# Patient Record
Sex: Female | Born: 1965 | Race: White | Hispanic: No | Marital: Single | Smoking: Never smoker
Health system: Southern US, Community
[De-identification: ages and names within clinical notes are randomized; demographics above are authoritative.]

## PROBLEM LIST (undated history)

## (undated) DIAGNOSIS — M1990 Unspecified osteoarthritis, unspecified site: Secondary | ICD-10-CM

## (undated) DIAGNOSIS — G8929 Other chronic pain: Secondary | ICD-10-CM

## (undated) DIAGNOSIS — F32A Depression, unspecified: Secondary | ICD-10-CM

## (undated) DIAGNOSIS — F319 Bipolar disorder, unspecified: Secondary | ICD-10-CM

## (undated) DIAGNOSIS — I10 Essential (primary) hypertension: Secondary | ICD-10-CM

## (undated) DIAGNOSIS — K219 Gastro-esophageal reflux disease without esophagitis: Secondary | ICD-10-CM

## (undated) DIAGNOSIS — F419 Anxiety disorder, unspecified: Secondary | ICD-10-CM

## (undated) HISTORY — DX: Unspecified osteoarthritis, unspecified site: M19.90

## (undated) HISTORY — DX: Gastro-esophageal reflux disease without esophagitis: K21.9

## (undated) HISTORY — PX: TONSILLECTOMY: SUR1361

## (undated) HISTORY — PX: OTHER SURGICAL HISTORY: SHX169

---

## 2008-06-10 DIAGNOSIS — Z72820 Sleep deprivation: Secondary | ICD-10-CM | POA: Insufficient documentation

## 2008-06-10 DIAGNOSIS — F4389 Other reactions to severe stress: Secondary | ICD-10-CM | POA: Insufficient documentation

## 2009-01-26 DIAGNOSIS — Z124 Encounter for screening for malignant neoplasm of cervix: Secondary | ICD-10-CM | POA: Insufficient documentation

## 2009-01-26 DIAGNOSIS — G43119 Migraine with aura, intractable, without status migrainosus: Secondary | ICD-10-CM | POA: Insufficient documentation

## 2009-01-26 DIAGNOSIS — E568 Deficiency of other vitamins: Secondary | ICD-10-CM | POA: Insufficient documentation

## 2009-02-08 DIAGNOSIS — L989 Disorder of the skin and subcutaneous tissue, unspecified: Secondary | ICD-10-CM | POA: Insufficient documentation

## 2009-02-08 DIAGNOSIS — L723 Sebaceous cyst: Secondary | ICD-10-CM | POA: Insufficient documentation

## 2009-02-18 DIAGNOSIS — Z4802 Encounter for removal of sutures: Secondary | ICD-10-CM | POA: Insufficient documentation

## 2009-10-07 DIAGNOSIS — R599 Enlarged lymph nodes, unspecified: Secondary | ICD-10-CM | POA: Insufficient documentation

## 2010-01-26 DIAGNOSIS — N959 Unspecified menopausal and perimenopausal disorder: Secondary | ICD-10-CM | POA: Insufficient documentation

## 2010-01-26 DIAGNOSIS — R82998 Other abnormal findings in urine: Secondary | ICD-10-CM | POA: Insufficient documentation

## 2010-02-09 DIAGNOSIS — H8110 Benign paroxysmal vertigo, unspecified ear: Secondary | ICD-10-CM | POA: Insufficient documentation

## 2010-02-13 DIAGNOSIS — Z1389 Encounter for screening for other disorder: Secondary | ICD-10-CM | POA: Insufficient documentation

## 2010-04-28 DIAGNOSIS — H1130 Conjunctival hemorrhage, unspecified eye: Secondary | ICD-10-CM | POA: Insufficient documentation

## 2011-03-07 DIAGNOSIS — M659 Unspecified synovitis and tenosynovitis, unspecified site: Secondary | ICD-10-CM | POA: Insufficient documentation

## 2011-03-07 DIAGNOSIS — H02829 Cysts of unspecified eye, unspecified eyelid: Secondary | ICD-10-CM | POA: Insufficient documentation

## 2011-03-14 DIAGNOSIS — G479 Sleep disorder, unspecified: Secondary | ICD-10-CM | POA: Insufficient documentation

## 2012-03-04 DIAGNOSIS — M179 Osteoarthritis of knee, unspecified: Secondary | ICD-10-CM | POA: Insufficient documentation

## 2012-03-04 DIAGNOSIS — M170 Bilateral primary osteoarthritis of knee: Secondary | ICD-10-CM | POA: Insufficient documentation

## 2012-12-30 DIAGNOSIS — G8929 Other chronic pain: Secondary | ICD-10-CM

## 2012-12-30 DIAGNOSIS — F0630 Mood disorder due to known physiological condition, unspecified: Secondary | ICD-10-CM | POA: Insufficient documentation

## 2013-09-01 DIAGNOSIS — M5136 Other intervertebral disc degeneration, lumbar region: Secondary | ICD-10-CM | POA: Insufficient documentation

## 2013-09-24 DIAGNOSIS — M25519 Pain in unspecified shoulder: Secondary | ICD-10-CM | POA: Insufficient documentation

## 2014-01-28 DIAGNOSIS — S93409A Sprain of unspecified ligament of unspecified ankle, initial encounter: Secondary | ICD-10-CM | POA: Insufficient documentation

## 2014-04-22 DIAGNOSIS — F32A Depression, unspecified: Secondary | ICD-10-CM

## 2014-06-27 DIAGNOSIS — M4310 Spondylolisthesis, site unspecified: Secondary | ICD-10-CM | POA: Insufficient documentation

## 2014-08-12 DIAGNOSIS — M25569 Pain in unspecified knee: Secondary | ICD-10-CM | POA: Insufficient documentation

## 2014-08-12 DIAGNOSIS — M25561 Pain in right knee: Secondary | ICD-10-CM | POA: Insufficient documentation

## 2014-11-09 DIAGNOSIS — Z780 Asymptomatic menopausal state: Secondary | ICD-10-CM | POA: Insufficient documentation

## 2014-11-09 DIAGNOSIS — J309 Allergic rhinitis, unspecified: Secondary | ICD-10-CM | POA: Insufficient documentation

## 2014-11-09 DIAGNOSIS — E669 Obesity, unspecified: Secondary | ICD-10-CM | POA: Insufficient documentation

## 2014-11-09 DIAGNOSIS — G4733 Obstructive sleep apnea (adult) (pediatric): Secondary | ICD-10-CM | POA: Insufficient documentation

## 2015-09-27 DIAGNOSIS — F1190 Opioid use, unspecified, uncomplicated: Secondary | ICD-10-CM | POA: Insufficient documentation

## 2016-03-08 DIAGNOSIS — S52501D Unspecified fracture of the lower end of right radius, subsequent encounter for closed fracture with routine healing: Secondary | ICD-10-CM | POA: Insufficient documentation

## 2016-06-21 DIAGNOSIS — G5601 Carpal tunnel syndrome, right upper limb: Secondary | ICD-10-CM | POA: Insufficient documentation

## 2017-01-22 DIAGNOSIS — F410 Panic disorder [episodic paroxysmal anxiety] without agoraphobia: Secondary | ICD-10-CM

## 2017-01-22 DIAGNOSIS — R5382 Chronic fatigue, unspecified: Secondary | ICD-10-CM | POA: Insufficient documentation

## 2021-04-17 ENCOUNTER — Emergency Department (HOSPITAL_COMMUNITY)
Admission: EM | Admit: 2021-04-17 | Discharge: 2021-04-18 | Disposition: A | Discharge status: Home / Self Care | Payer: BC Managed Care – PPO | Source: Home / Self Care | Attending: Emergency Medicine | Admitting: Emergency Medicine

## 2021-04-17 ENCOUNTER — Emergency Department (HOSPITAL_COMMUNITY): Payer: BC Managed Care – PPO

## 2021-04-17 DIAGNOSIS — I10 Essential (primary) hypertension: Secondary | ICD-10-CM | POA: Insufficient documentation

## 2021-04-17 DIAGNOSIS — R079 Chest pain, unspecified: Secondary | ICD-10-CM

## 2021-04-17 LAB — BASIC METABOLIC PANEL
Anion gap: 8 (ref 5–15)
BUN: 13 mg/dL (ref 6–20)
CO2: 29 mmol/L (ref 22–32)
Calcium: 9.3 mg/dL (ref 8.9–10.3)
Chloride: 101 mmol/L (ref 98–111)
Creatinine, Ser: 0.88 mg/dL (ref 0.44–1.00)
GFR, Estimated: >60 mL/min (ref >60)
Glucose, Bld: 127 mg/dL — ABNORMAL HIGH (ref 70–99)
Potassium: 4 mmol/L (ref 3.5–5.1)
Sodium: 138 mmol/L (ref 135–145)

## 2021-04-17 LAB — CBC
HCT: 40.8 % (ref 36.0–46.0)
Hemoglobin: 13.2 g/dL (ref 12.0–15.0)
MCH: 28.1 pg (ref 26.0–34.0)
MCHC: 32.4 g/dL (ref 30.0–36.0)
MCV: 87 fL (ref 80.0–100.0)
Platelets: 214 10*3/uL (ref 150–400)
RBC: 4.69 MIL/uL (ref 3.87–5.11)
RDW: 12.1 % (ref 11.5–15.5)
WBC: 9.8 10*3/uL (ref 4.0–10.5)
nRBC: 0 % (ref 0.0–0.2)

## 2021-04-17 LAB — TROPONIN I (HIGH SENSITIVITY): Troponin I (High Sensitivity): 3 ng/L (ref <18)

## 2021-04-17 NOTE — ED Triage Notes (Signed)
Pt c/o chest pressure starting last night around 9pm. Describes it as a pressure that is worse when laying down. Denies SOB

## 2021-04-17 NOTE — ED Triage Notes (Signed)
Emergency Medicine Provider Triage Evaluation Note  Holly Roberts , a 55 y.o. female  was evaluated in triage.  Pt complains of CP diffuse, onset 9PM, unable to sleep due to pain, described as pressure and sharp, constant, worse with lying down. Denies fevers, chills, cough, congestion, SHOB. Temp 100.1, has had vaccines, not due for booster.  Review of Systems  Positive: Chest pain, fever Negative: SHOB  Physical Exam  BP (!) 158/103 (BP Location: Left Arm)   Pulse (!) 101   Resp 18   Ht 5\' 4"  (1.626 m)   Wt 113.4 kg   SpO2 93%   BMI 42.91 kg/m  Gen:   Awake, no distress   HEENT:  Atraumatic  Resp:  Normal effort  Cardiac:  Normal rate  Abd:   Nondistended, nontender  MSK:   Moves extremities without difficulty  Neuro:  Speech clear   Medical Decision Making  Medically screening exam initiated at 7:50 PM.  Appropriate orders placed.  Holly Roberts was informed that the remainder of the evaluation will be completed by another provider, this initial triage assessment does not replace that evaluation, and the importance of remaining in the ED until their evaluation is complete.  Clinical Impression     Holly Roberts 04/17/21 1953

## 2021-04-18 LAB — TROPONIN I (HIGH SENSITIVITY): Troponin I (High Sensitivity): 3 ng/L (ref <18)

## 2021-04-18 MED ORDER — ACETAMINOPHEN 500 MG PO TABS
1000.0000 mg | ORAL_TABLET | Freq: Once | ORAL | Status: AC
  Administered 2021-04-18: 1000 mg via ORAL
  Filled 2021-04-18: qty 2

## 2021-04-18 MED ORDER — METOPROLOL TARTRATE 25 MG PO TABS
25.0000 mg | ORAL_TABLET | Freq: Once | ORAL | Status: AC
  Administered 2021-04-18: 25 mg via ORAL
  Filled 2021-04-18: qty 1

## 2021-04-18 MED ORDER — CLONIDINE HCL 0.1 MG PO TABS
0.1000 mg | ORAL_TABLET | Freq: Once | ORAL | Status: AC
  Administered 2021-04-18: 0.1 mg via ORAL
  Filled 2021-04-18: qty 1

## 2021-04-18 MED ORDER — METOPROLOL TARTRATE 25 MG PO TABS: 25.0000 mg | ORAL_TABLET | Freq: Every day | ORAL | 0 refills | Status: DC

## 2021-04-18 NOTE — Discharge Instructions (Addendum)
Your cardiac work-up today was normal.  There was no evidence of heart attack. Your blood pressure was significantly elevated here but has improved after some medications.   We will start you on low dose blood pressure medications for now.  You do need to arrange follow-up with your primary care doctor for ongoing management of this. Return here immediately for any new/acute changes.

## 2021-04-18 NOTE — ED Notes (Signed)
ED Provider at bedside. 

## 2021-04-18 NOTE — ED Provider Notes (Signed)
[facility]-EMERGENCY DEPT Provider Note   CSN: [id number] Arrival date & time: 04/17/21  1925     History Chief Complaint  Patient presents with  . Chest Pain    Holly Roberts is a 55 y.o. female.  The history is provided by the patient and medical records.    55 year old female presenting to the ED with chest pain.  This began Sunday night (2 days ago) around 9PM while lying in bed.  States it feels like a dull pressure throughout her entire chest.  She denies any sharp, stabbing sensations.  She has not had any palpitations, diaphoresis, nausea, vomiting, or feelings of syncope.  She states her chest is tender to the touch but she has not done any strenuous activity or anything to strain her chest muscles.  She does not have any ripping/tearing pain, no radiatio of pain into back or abdomen.  no numbness/weakness of extremities.  She has not had any cough, fever, or other infectious symptoms.  She denies any known cardiac history.  She has never been told she had hypertension, however has not had an in person clinic visit with her primary care doctor in about 2 years (only tele visits).  She is not a smoker.  No past medical history on file.  There are no problems to display for this patient.   OB History   No obstetric history on file.     No family history on file.     Home Medications Prior to Admission medications   Medication Sig Start Date End Date Taking? Authorizing Provider  Armodafinil 250 MG tablet Take 250 mg by mouth every morning. 04/09/21  Yes [provider]  diclofenac (VOLTAREN) 50 MG EC tablet Take 50 mg by mouth 2 (two) times daily. 02/08/21  Yes [provider]  NUCYNTA 100 MG TABS Take 200 mg by mouth daily in the afternoon. 03/27/21  Yes [provider]  NUCYNTA ER 200 MG TB12 Take 1 tablet by mouth 2 (two) times daily. 03/27/21  Yes [provider]  TRINTELLIX 10 MG TABS tablet Take 10 mg by mouth  daily. 04/09/21  Yes [provider]  VRAYLAR 4.5 MG CAPS Take 1 capsule by mouth daily. 04/06/21  Yes [provider]    Allergies    Buprenorphine, Amoxicillin-pot clavulanate, and Sulfa antibiotics  Review of Systems   Review of Systems  Cardiovascular: Positive for chest pain.  All other systems reviewed and are negative.   Physical Exam Updated Vital Signs BP (!) 207/133 Comment: PA aware  Pulse (!) 104   Temp 99.4 F (37.4 C) (Oral)   Resp 20   Ht 5\' 4"  (1.626 m)   Wt 113.4 kg   SpO2 95%   BMI 42.91 kg/m   Physical Exam Vitals and nursing note reviewed.  Constitutional:      Appearance: She is well-developed.     Comments: Obese, NAD  HENT:     Head: Normocephalic and atraumatic.  Eyes:     Conjunctiva/sclera: Conjunctivae normal.     Pupils: Pupils are equal, round, and reactive to light.  Cardiovascular:     Rate and Rhythm: Normal rate and regular rhythm.     Heart sounds: Normal heart sounds.  Pulmonary:     Effort: Pulmonary effort is normal.     Breath sounds: Normal breath sounds. No wheezing or rhonchi.  Chest:     Comments: Reproducible tenderness throughout chest wall, no rash or other overlying skin changes  Abdominal:     General: Bowel sounds are normal.     Palpations: Abdomen is soft.  Musculoskeletal:        General: Normal range of motion.     Cervical back: Normal range of motion.  Skin:    General: Skin is warm and dry.  Neurological:     Mental Status: She is alert and oriented to person, place, and time.     Comments: AAOx3, answering questions and following commands appropriately; equal strength UE and LE bilaterally; CN grossly intact; moves all extremities appropriately without ataxia; no focal neuro deficits or facial asymmetry appreciated     ED Results / Procedures / Treatments   Labs (all labs ordered are listed, but only abnormal results are displayed) Labs Reviewed  BASIC METABOLIC PANEL - Abnormal;  Notable for the following components:      Result Value   Glucose, Bld 127 (*)    All other components within normal limits  CBC  TROPONIN I (HIGH SENSITIVITY)  TROPONIN I (HIGH SENSITIVITY)    EKG None  Radiology DG Chest Port 1 View  Result Date: 04/17/2021 CLINICAL DATA:  Chest pain EXAM: PORTABLE CHEST 1 VIEW COMPARISON:  None. FINDINGS: The heart size and mediastinal contours are within normal limits. Both lungs are clear. The visualized skeletal structures are unremarkable. IMPRESSION: No active disease. Electronically Signed   By: Jasmine Pang M.D.   On: 04/17/2021 20:15    Procedures Procedures   Medications Ordered in ED Medications  metoprolol tartrate (LOPRESSOR) tablet 25 mg (has no administration in time range)  acetaminophen (TYLENOL) tablet 1,000 mg (has no administration in time range)    ED Course  I have reviewed the triage vital signs and the nursing notes.  Pertinent labs & imaging results that were available during my care of the patient were reviewed by me and considered in my medical decision making (see chart for details).    MDM Rules/Calculators/A&P  55 y.o. F here with dull, diffuse chest pain for the past 2 days.  Began while lying in bed, has been persistent since onset.  She is afebrile, non-toxic.  At time of evaluation, patient's BP is 207/133, mildly tachy as well.  She has no known history of hypertension, however has not had a in person visit with her physician in approximately 2 years (tele visits only).  Unknown exactly how long her blood pressure has been running this high.  She does complain of chest pain, however this is reproducible with palpation of the chest wall on exam.  She does have known longstanding chronic pain as well.  Her initial work-up is overall reassuring with normal renal function and troponin.  Her EKG has nonspecific T wave changes laterally but no acute ischemic findings.  Her chest x-ray is clear, no widened mediastinum.   She does have a mild headache at present but attributes this to not eating in several hours as she was waiting in the lobby.  Her neurologic exam is non-focal.  At this point given her significantly elevated BP, will try to reduce by about 20 to 25% with oral medication.  We will obtain delta troponin and reassess.  Delta trop remains negative.  BP has improved significantly with oral medications here in the ED, HR has also come down nicely.  Continues to complain of chest pain on exam but states very mild and much better than before.  No further headache and remains neurologically intact.  Feel ACS less likely given negative  work-up.  No risk factors for PE.  Symptoms not classic for aortic dissection (no ripping/tearing pain, no back pain, no widened mediastinum on x-ray, no hx of aneurysm).  May be due to BP but no evidence of end organ damage.  Feel she is stable for discharge home.  Will start on metoprolol since she had good response to that here in the ED.  Advised her it will take a bit before her pressures trend back to normal and remain there.  She does need to call and schedule follow-up with her primary care today for ongoing management of her blood pressure.  She is encouraged to return here for any new or acute changes.  Final Clinical Impression(s) / ED Diagnoses Final diagnoses:  Chest pain in adult  Hypertension, unspecified type    Rx / DC Orders ED Discharge Orders         Ordered    metoprolol tartrate (LOPRESSOR) 25 MG tablet  Daily        04/18/21 0543           Garlon Hatchet, PA-C 04/18/21 Volanda Napoleon    Tilden Fossa, MD 04/19/21 0330

## 2021-04-19 ENCOUNTER — Inpatient Hospital Stay (HOSPITAL_COMMUNITY)
Admission: EM | Admit: 2021-04-19 | Discharge: 2021-04-23 | DRG: 419 | Disposition: A | Discharge status: Home / Self Care | Payer: BC Managed Care – PPO | Attending: Surgery | Admitting: Surgery

## 2021-04-19 ENCOUNTER — Emergency Department (HOSPITAL_COMMUNITY): Payer: BC Managed Care – PPO

## 2021-04-19 ENCOUNTER — Other Ambulatory Visit: Payer: Self-pay

## 2021-04-19 ENCOUNTER — Encounter (HOSPITAL_COMMUNITY): Payer: Self-pay | Admitting: *Deleted

## 2021-04-19 DIAGNOSIS — Z79899 Other long term (current) drug therapy: Secondary | ICD-10-CM

## 2021-04-19 DIAGNOSIS — F419 Anxiety disorder, unspecified: Secondary | ICD-10-CM | POA: Diagnosis present

## 2021-04-19 DIAGNOSIS — K8010 Calculus of gallbladder with chronic cholecystitis without obstruction: Secondary | ICD-10-CM | POA: Diagnosis present

## 2021-04-19 DIAGNOSIS — Z880 Allergy status to penicillin: Secondary | ICD-10-CM

## 2021-04-19 DIAGNOSIS — K810 Acute cholecystitis: Secondary | ICD-10-CM

## 2021-04-19 DIAGNOSIS — K8001 Calculus of gallbladder with acute cholecystitis with obstruction: Principal | ICD-10-CM | POA: Diagnosis present

## 2021-04-19 DIAGNOSIS — Z888 Allergy status to other drugs, medicaments and biological substances status: Secondary | ICD-10-CM

## 2021-04-19 DIAGNOSIS — I10 Essential (primary) hypertension: Secondary | ICD-10-CM | POA: Diagnosis present

## 2021-04-19 DIAGNOSIS — J9601 Acute respiratory failure with hypoxia: Secondary | ICD-10-CM

## 2021-04-19 DIAGNOSIS — Z419 Encounter for procedure for purposes other than remedying health state, unspecified: Secondary | ICD-10-CM

## 2021-04-19 DIAGNOSIS — Z20822 Contact with and (suspected) exposure to covid-19: Secondary | ICD-10-CM | POA: Diagnosis present

## 2021-04-19 HISTORY — DX: Essential (primary) hypertension: I10

## 2021-04-19 LAB — COMPREHENSIVE METABOLIC PANEL
ALT: 30 U/L (ref 0–44)
AST: 20 U/L (ref 15–41)
Albumin: 3.9 g/dL (ref 3.5–5.0)
Alkaline Phosphatase: 129 U/L — ABNORMAL HIGH (ref 38–126)
Anion gap: 9 (ref 5–15)
BUN: 13 mg/dL (ref 6–20)
CO2: 25 mmol/L (ref 22–32)
Calcium: 8.8 mg/dL — ABNORMAL LOW (ref 8.9–10.3)
Chloride: 103 mmol/L (ref 98–111)
Creatinine, Ser: 0.93 mg/dL (ref 0.44–1.00)
GFR, Estimated: >60 mL/min (ref >60)
Glucose, Bld: 97 mg/dL (ref 70–99)
Potassium: 3.5 mmol/L (ref 3.5–5.1)
Sodium: 137 mmol/L (ref 135–145)
Total Bilirubin: 0.9 mg/dL (ref 0.3–1.2)
Total Protein: 7.1 g/dL (ref 6.5–8.1)

## 2021-04-19 LAB — CBC WITH DIFFERENTIAL/PLATELET
Abs Immature Granulocytes: 0.05 10*3/uL (ref 0.00–0.07)
Basophils Absolute: 0 10*3/uL (ref 0.0–0.1)
Basophils Relative: 0 %
Eosinophils Absolute: 0 10*3/uL (ref 0.0–0.5)
Eosinophils Relative: 0 %
HCT: 36.8 % (ref 36.0–46.0)
Hemoglobin: 12 g/dL (ref 12.0–15.0)
Immature Granulocytes: 0 %
Lymphocytes Relative: 5 %
Lymphs Abs: 0.6 10*3/uL — ABNORMAL LOW (ref 0.7–4.0)
MCH: 28.3 pg (ref 26.0–34.0)
MCHC: 32.6 g/dL (ref 30.0–36.0)
MCV: 86.8 fL (ref 80.0–100.0)
Monocytes Absolute: 0.6 10*3/uL (ref 0.1–1.0)
Monocytes Relative: 5 %
Neutro Abs: 10.5 10*3/uL — ABNORMAL HIGH (ref 1.7–7.7)
Neutrophils Relative %: 90 %
Platelets: 182 10*3/uL (ref 150–400)
RBC: 4.24 MIL/uL (ref 3.87–5.11)
RDW: 12.3 % (ref 11.5–15.5)
WBC: 11.8 10*3/uL — ABNORMAL HIGH (ref 4.0–10.5)
nRBC: 0 % (ref 0.0–0.2)

## 2021-04-19 LAB — URINALYSIS, ROUTINE W REFLEX MICROSCOPIC
Bacteria, UA: NONE SEEN
Bilirubin Urine: NEGATIVE
Glucose, UA: NEGATIVE mg/dL
Ketones, ur: 5 mg/dL — AB
Nitrite: NEGATIVE
Protein, ur: 30 mg/dL — AB
Specific Gravity, Urine: 1.024 (ref 1.005–1.030)
pH: 5 (ref 5.0–8.0)

## 2021-04-19 LAB — SURGICAL PCR SCREEN
MRSA, PCR: NEGATIVE
Staphylococcus aureus: NEGATIVE

## 2021-04-19 LAB — LACTIC ACID, PLASMA: Lactic Acid, Venous: 0.7 mmol/L (ref 0.5–1.9)

## 2021-04-19 LAB — RESP PANEL BY RT-PCR (FLU A&B, COVID) ARPGX2
Influenza A by PCR: NEGATIVE
Influenza B by PCR: NEGATIVE
SARS Coronavirus 2 by RT PCR: NEGATIVE

## 2021-04-19 LAB — TROPONIN I (HIGH SENSITIVITY)
Troponin I (High Sensitivity): 3 ng/L (ref <18)
Troponin I (High Sensitivity): 3 ng/L (ref <18)

## 2021-04-19 LAB — LIPASE, BLOOD: Lipase: 42 U/L (ref 11–51)

## 2021-04-19 LAB — HIV ANTIBODY (ROUTINE TESTING W REFLEX): HIV Screen 4th Generation wRfx: NONREACTIVE

## 2021-04-19 MED ORDER — DIPHENHYDRAMINE HCL 50 MG/ML IJ SOLN: 25.0000 mg | Freq: Four times a day (QID) | INTRAMUSCULAR | Status: DC | PRN

## 2021-04-19 MED ORDER — ACETAMINOPHEN 325 MG PO TABS
650.0000 mg | ORAL_TABLET | Freq: Once | ORAL | Status: AC
  Administered 2021-04-19: 650 mg via ORAL
  Filled 2021-04-19: qty 2

## 2021-04-19 MED ORDER — METOPROLOL TARTRATE 25 MG PO TABS
25.0000 mg | ORAL_TABLET | Freq: Every day | ORAL | Status: DC
  Administered 2021-04-21 – 2021-04-22 (×2): 25 mg via ORAL
  Filled 2021-04-19 (×3): qty 1

## 2021-04-19 MED ORDER — IOHEXOL 300 MG/ML  SOLN: 100.0000 mL | Freq: Once | INTRAMUSCULAR | Status: DC | PRN

## 2021-04-19 MED ORDER — ONDANSETRON 4 MG PO TBDP
4.0000 mg | ORAL_TABLET | Freq: Four times a day (QID) | ORAL | Status: DC | PRN
Start: 2021-04-19 — End: 2021-04-23
  Administered 2021-04-22 – 2021-04-23 (×2): 4 mg via ORAL
  Filled 2021-04-19 (×2): qty 1

## 2021-04-19 MED ORDER — ACETAMINOPHEN 650 MG RE SUPP: 650.0000 mg | Freq: Four times a day (QID) | RECTAL | Status: DC | PRN

## 2021-04-19 MED ORDER — ENOXAPARIN SODIUM 40 MG/0.4ML IJ SOSY
40.0000 mg | PREFILLED_SYRINGE | INTRAMUSCULAR | Status: DC
  Administered 2021-04-19 – 2021-04-22 (×3): 40 mg via SUBCUTANEOUS
  Filled 2021-04-19 (×3): qty 0.4

## 2021-04-19 MED ORDER — FENTANYL CITRATE (PF) 100 MCG/2ML IJ SOLN
50.0000 ug | Freq: Once | INTRAMUSCULAR | Status: AC
  Administered 2021-04-19: 50 ug via INTRAVENOUS
  Filled 2021-04-19: qty 2

## 2021-04-19 MED ORDER — ONDANSETRON HCL 4 MG/2ML IJ SOLN
4.0000 mg | Freq: Four times a day (QID) | INTRAMUSCULAR | Status: DC | PRN
Start: 2021-04-19 — End: 2021-04-23

## 2021-04-19 MED ORDER — DIPHENHYDRAMINE HCL 25 MG PO CAPS: 25.0000 mg | ORAL_CAPSULE | Freq: Four times a day (QID) | ORAL | Status: DC | PRN

## 2021-04-19 MED ORDER — PIPERACILLIN-TAZOBACTAM 3.375 G IVPB 30 MIN
3.3750 g | Freq: Once | INTRAVENOUS | Status: AC
  Administered 2021-04-19: 3.375 g via INTRAVENOUS
  Filled 2021-04-19: qty 50

## 2021-04-19 MED ORDER — ONDANSETRON HCL 4 MG/2ML IJ SOLN
4.0000 mg | Freq: Once | INTRAMUSCULAR | Status: AC
  Administered 2021-04-19: 4 mg via INTRAVENOUS
  Filled 2021-04-19: qty 2

## 2021-04-19 MED ORDER — PIPERACILLIN-TAZOBACTAM 3.375 G IVPB
3.3750 g | Freq: Three times a day (TID) | INTRAVENOUS | Status: DC
  Administered 2021-04-19 – 2021-04-23 (×10): 3.375 g via INTRAVENOUS
  Filled 2021-04-19 (×10): qty 50

## 2021-04-19 MED ORDER — POTASSIUM CHLORIDE IN NACL 20-0.9 MEQ/L-% IV SOLN
INTRAVENOUS | Status: DC
  Filled 2021-04-19 (×7): qty 1000

## 2021-04-19 MED ORDER — SODIUM CHLORIDE 0.9 % IV BOLUS
1000.0000 mL | Freq: Once | INTRAVENOUS | Status: AC
  Administered 2021-04-19: 1000 mL via INTRAVENOUS

## 2021-04-19 MED ORDER — HYDROMORPHONE HCL 1 MG/ML IJ SOLN
1.0000 mg | INTRAMUSCULAR | Status: DC | PRN
  Administered 2021-04-19 – 2021-04-20 (×3): 1 mg via INTRAVENOUS
  Filled 2021-04-19 (×3): qty 1

## 2021-04-19 MED ORDER — ACETAMINOPHEN 325 MG PO TABS
650.0000 mg | ORAL_TABLET | Freq: Four times a day (QID) | ORAL | Status: DC | PRN
Start: 2021-04-19 — End: 2021-04-23
  Administered 2021-04-22: 650 mg via ORAL
  Filled 2021-04-19: qty 2

## 2021-04-19 MED ORDER — OXYCODONE HCL 5 MG PO TABS
5.0000 mg | ORAL_TABLET | ORAL | Status: DC | PRN
  Administered 2021-04-19: 10 mg via ORAL
  Filled 2021-04-19: qty 2

## 2021-04-19 MED ORDER — IOHEXOL 350 MG/ML SOLN
100.0000 mL | Freq: Once | INTRAVENOUS | Status: AC | PRN
  Administered 2021-04-19: 100 mL via INTRAVENOUS

## 2021-04-19 NOTE — ED Provider Notes (Signed)
[facility] DEPT Provider Note   CSN: [id number] Arrival date & time: 04/19/21  7290     History Chief Complaint  Patient presents with  . Chest Pain    Holly Roberts is a 55 y.o. female with past medical history significant for chronic pain to BL shoulder, knees (Followed by [facility] pain [facility]) who presents for evaluation of right-sided chest pain.  Pain located to right lower chest, right axillary region and into right upper abdomen.  Worse with deep breathing.  Not worse with food intake.  States she was seen here 2 days ago however felt at that time her pain was more diffuse in nature to her chest. Feels like her pain today is different than prior ED visit. This pain began yesterday evening.  Her pain does not radiate into her back.  No associate diaphoresis, paresthesias, weakness.  Apparently was hypertensive on her prior ED visit.  She denies any prior history of hypertension however has not followed with PCP in greater than 2 years.  Pain is described as sharp and stabbing with deep breathing.  She denies any chest pain with exertion.  She has felt warm however is not taken her temperature.  No cough.  No recent COVID exposures.  She is vaccinated for covid however not boosted.  She denies any known cardiac or pulmonary histories. States occasionally she does get lower extremity swelling at her feet when she walks which resolves when she elevates them.  No PND or orthopnea. She denies any current lower extremity swelling.  No prior history of PE, DVT, recent surgery, immobilization, malignancy.  States she does feel short of breath. No recent injections for her chronic pain.  No headache, lightheadedness, dizziness, hemoptysis, dysuria, diarrhea, constipation, rashes or lesions.  Patient feels like her chest pain goes into her right upper abdomen.  No associated epigastric pain.  No chronic EtOH use, NSAID use, melena or blood per rectum.  She still has her  gallbladder.  Rates current pain a 10/10. Denies additional aggravating or alleviating factors.  History obtained from patient and past medical records.  No interpreter used.  HPI     Past Medical History:  Diagnosis Date  . Hypertension     Patient Active Problem List   Diagnosis Date Noted  . Cholecystitis with cholelithiasis 04/19/2021    Past Surgical History:  Procedure Laterality Date  . Right knee surgery    . TONSILLECTOMY       OB History   No obstetric history on file.     History reviewed. No pertinent family history.  Social History   Tobacco Use  . Smoking status: Never Smoker  . Smokeless tobacco: Never Used  Vaping Use  . Vaping Use: Some days  Substance Use Topics  . Alcohol use: Never  . Drug use: Never    Home Medications Prior to Admission medications   Medication Sig Start Date End Date Taking? Authorizing Provider  Armodafinil 250 MG tablet Take 250 mg by mouth every morning. 04/09/21  Yes [provider]  diclofenac (VOLTAREN) 50 MG EC tablet Take 50 mg by mouth daily. 02/08/21  Yes [provider]  metoprolol tartrate (LOPRESSOR) 25 MG tablet Take 1 tablet (25 mg total) by mouth daily. 04/18/21  Yes Larene Pickett, PA-C  NUCYNTA 100 MG TABS Take 200 mg by mouth daily in the afternoon. 03/27/21  Yes [provider]  NUCYNTA ER 200 MG TB12 Take 200 mg by mouth 2 (two) times  daily. 03/27/21  Yes [provider]  TRINTELLIX 10 MG TABS tablet Take 10 mg by mouth daily. 04/09/21  Yes [provider]  VRAYLAR 4.5 MG CAPS Take 4.5 mg by mouth daily. 04/06/21  Yes [provider]    Allergies    Buprenorphine, Amoxicillin-pot clavulanate, and Sulfa antibiotics  Review of Systems   Review of Systems  Constitutional: Positive for chills and fatigue.  HENT: Negative.   Respiratory: Positive for shortness of breath. Negative for cough, choking, chest tightness, wheezing and stridor.    Cardiovascular: Positive for chest pain. Negative for palpitations.  Gastrointestinal: Positive for abdominal pain. Negative for abdominal distention, blood in stool, constipation, diarrhea, nausea and vomiting.  Genitourinary: Negative.   Musculoskeletal: Negative.   Skin: Negative.   Neurological: Negative.   All other systems reviewed and are negative.   Physical Exam Updated Vital Signs BP (!) 141/109   Pulse 83   Temp (!) 101 F (38.3 C) (Oral)   Resp 13   SpO2 93%   Physical Exam Vitals and nursing note reviewed. Exam conducted with a chaperone present.  Constitutional:      General: She is not in acute distress.    Appearance: She is well-developed. She is ill-appearing. She is not toxic-appearing or diaphoretic.  HENT:     Head: Atraumatic.  Eyes:     Pupils: Pupils are equal, round, and reactive to light.  Cardiovascular:     Rate and Rhythm: Normal rate.     Pulses: Normal pulses.          Radial pulses are 2+ on the right side and 2+ on the left side.       Dorsalis pedis pulses are 2+ on the right side and 2+ on the left side.     Heart sounds: Normal heart sounds.  Pulmonary:     Effort: Pulmonary effort is normal. No tachypnea or respiratory distress.     Breath sounds: Normal breath sounds.     Comments: Clear to auscultation bilaterally.  Splints, holding under right breast with deep breathing.  Speaks in full sentences without difficulty Chest:       Comments: Equal rise and fall to chest wall.  Diffuse tenderness under her right breast, right upper abdomen.  No overlying skin changes.  No overlying skin changes to right breast. Abdominal:     General: Bowel sounds are normal. There is no distension.     Palpations: Abdomen is soft.     Tenderness: There is abdominal tenderness in the right upper quadrant. There is no right CVA tenderness, left CVA tenderness, guarding or rebound. Negative signs include Murphy's sign and McBurney's sign.     Hernia: No  hernia is present.       Comments: Mild tenderness to right upper abdomen, right anterior/ lateral ribs however negative Murphy sign.  No rebound or guarding.  No overlying skin changes to abdominal wall  Musculoskeletal:        General: Normal range of motion.     Cervical back: Normal range of motion.     Comments: No bony tenderness.  Moves all 4 extremities without difficulty.  Compartments soft.  No lower extremity edema, erythema or warmth  Skin:    General: Skin is warm and dry.     Capillary Refill: Capillary refill takes less than 2 seconds.     Comments: No edema, erythema or warmth, rashes or lesions.  Neurological:     Mental Status: She is alert.  Comments: Cranial nerves II through XII grossly intact     ED Results / Procedures / Treatments   Labs (all labs ordered are listed, but only abnormal results are displayed) Labs Reviewed  CBC WITH DIFFERENTIAL/PLATELET - Abnormal; Notable for the following components:      Result Value   WBC 11.8 (*)    Neutro Abs 10.5 (*)    Lymphs Abs 0.6 (*)    All other components within normal limits  COMPREHENSIVE METABOLIC PANEL - Abnormal; Notable for the following components:   Calcium 8.8 (*)    Alkaline Phosphatase 129 (*)    All other components within normal limits  URINALYSIS, ROUTINE W REFLEX MICROSCOPIC - Abnormal; Notable for the following components:   Color, Urine AMBER (*)    Hgb urine dipstick SMALL (*)    Ketones, ur 5 (*)    Protein, ur 30 (*)    Leukocytes,Ua MODERATE (*)    All other components within normal limits  RESP PANEL BY RT-PCR (FLU A&B, COVID) ARPGX2  CULTURE, BLOOD (ROUTINE X 2)  CULTURE, BLOOD (ROUTINE X 2)  LIPASE, BLOOD  LACTIC ACID, PLASMA  TROPONIN I (HIGH SENSITIVITY)  TROPONIN I (HIGH SENSITIVITY)    EKG None  Radiology CT Angio Chest PE W/Cm &/Or Wo Cm  Result Date: 04/19/2021 CLINICAL DATA:  PE suspected right lower chest pain, fever EXAM: CT ANGIOGRAPHY CHEST WITH CONTRAST  TECHNIQUE: Multidetector CT imaging of the chest was performed using the standard protocol during bolus administration of intravenous contrast. Multiplanar CT image reconstructions and MIPs were obtained to evaluate the vascular anatomy. CONTRAST:  153m OMNIPAQUE IOHEXOL 350 MG/ML SOLN COMPARISON:  None. FINDINGS: Cardiovascular: Examination for pulmonary embolism is limited by body habitus and breath motion artifact. Within this limitation, no evidence of pulmonary embolism through the proximal segmental pulmonary arterial level. Cardiomegaly. Scattered left coronary artery calcifications. No pericardial effusion. Mediastinum/Nodes: No enlarged mediastinal, hilar, or axillary lymph nodes. Thyroid gland, trachea, and esophagus demonstrate no significant findings. Lungs/Pleura: Trace bilateral pleural effusions and associated atelectasis or consolidation. Upper Abdomen: Please see separately reported examination of the abdomen and pelvis. Musculoskeletal: No chest wall abnormality. No acute or significant osseous findings. Review of the MIP images confirms the above findings. IMPRESSION: 1. Examination for pulmonary embolism is limited by body habitus and breath motion artifact. Within this limitation, no evidence of pulmonary embolism through the proximal segmental pulmonary arterial level. 2. Trace bilateral pleural effusions and associated atelectasis or consolidation. 3. Cardiomegaly and coronary artery disease. Electronically Signed   By: AEddie CandleM.D.   On: 04/19/2021 13:59   CT Abdomen Pelvis W Contrast  Result Date: 04/19/2021 CLINICAL DATA:  Abdominal pain and fever EXAM: CT ABDOMEN AND PELVIS WITH CONTRAST TECHNIQUE: Multidetector CT imaging of the abdomen and pelvis was performed using the standard protocol following bolus administration of intravenous contrast. CONTRAST:  1047mOMNIPAQUE IOHEXOL 350 MG/ML SOLN COMPARISON:  None. FINDINGS: Lower chest: No acute abnormality. Hepatobiliary: No solid  liver abnormality is seen. Gallbladder wall thickening and trace pericholecystic fluid. Faintly rim calcified gallstone measuring 1.4 cm near the gallbladder neck (series 2, image 25). Intra and extrahepatic biliary ductal dilatation, the central common bile duct measuring up to 1.1 cm in caliber. No obstructing calculus identified to the ampulla (series 4, image 71). Pancreas: Unremarkable. No pancreatic ductal dilatation or surrounding inflammatory changes. Spleen: Normal in size without significant abnormality. Adrenals/Urinary Tract: Adrenal glands are unremarkable. Kidneys are normal, without renal calculi, solid lesion, or hydronephrosis. Bladder  is unremarkable. Stomach/Bowel: Stomach is within normal limits. Appendix appears normal. No evidence of bowel wall thickening, distention, or inflammatory changes. Vascular/Lymphatic: No significant vascular findings are present. No enlarged abdominal or pelvic lymph nodes. Reproductive: No mass or other significant abnormality. Other: No abdominal wall hernia or abnormality. Trace fluid in the low pelvis. Musculoskeletal: No acute or significant osseous findings. IMPRESSION: 1. Gallbladder wall thickening and trace pericholecystic fluid. Faintly rim calcified gallstone measuring 1.4 cm near the gallbladder neck. Findings are concerning for acute cholecystitis. 2. Intra- and extrahepatic biliary ductal dilatation, the central common bile duct measuring up to 1.1 cm in caliber. No obstructing calculus identified to the ampulla. A recently passed calculus or a noncalcified calculus could produce this appearance. 3. Trace fluid in the low pelvis, likely reactive. Electronically Signed   By: Eddie Candle M.D.   On: 04/19/2021 13:56   DG Chest Portable 1 View  Result Date: 04/19/2021 CLINICAL DATA:  RIGHT lower chest pain and hypoxia, RIGHT mid axillary pain since last night worse with inspiration EXAM: PORTABLE CHEST 1 VIEW COMPARISON:  Portable exam 1003 hours  compared to 04/17/2021 FINDINGS: Upper normal heart size. Mediastinal contours and pulmonary vascularity normal. Decreased lung volumes with bibasilar atelectasis. Remaining lungs clear. No acute infiltrate, pleural effusion or pneumothorax. LEFT glenohumeral degenerative changes. IMPRESSION: Decreased lung volumes with bibasilar atelectasis. Electronically Signed   By: Lavonia Dana M.D.   On: 04/19/2021 10:25   DG Chest Port 1 View  Result Date: 04/17/2021 CLINICAL DATA:  Chest pain EXAM: PORTABLE CHEST 1 VIEW COMPARISON:  None. FINDINGS: The heart size and mediastinal contours are within normal limits. Both lungs are clear. The visualized skeletal structures are unremarkable. IMPRESSION: No active disease. Electronically Signed   By: Donavan Foil M.D.   On: 04/17/2021 20:15    Procedures Procedures   Medications Ordered in ED Medications  oxyCODONE (Oxy IR/ROXICODONE) immediate release tablet 5-10 mg (has no administration in time range)  HYDROmorphone (DILAUDID) injection 1 mg (has no administration in time range)  diphenhydrAMINE (BENADRYL) capsule 25 mg (has no administration in time range)    Or  diphenhydrAMINE (BENADRYL) injection 25 mg (has no administration in time range)  acetaminophen (TYLENOL) tablet 650 mg (has no administration in time range)    Or  acetaminophen (TYLENOL) suppository 650 mg (has no administration in time range)  ondansetron (ZOFRAN-ODT) disintegrating tablet 4 mg (has no administration in time range)    Or  ondansetron (ZOFRAN) injection 4 mg (has no administration in time range)  sodium chloride 0.9 % bolus 1,000 mL (1,000 mLs Intravenous New Bag/Given 04/19/21 1043)  ondansetron (ZOFRAN) injection 4 mg (4 mg Intravenous Given 04/19/21 1044)  fentaNYL (SUBLIMAZE) injection 50 mcg (50 mcg Intravenous Given 04/19/21 1044)  acetaminophen (TYLENOL) tablet 650 mg (650 mg Oral Given 04/19/21 1048)  iohexol (OMNIPAQUE) 350 MG/ML injection 100 mL (100 mLs Intravenous  Contrast Given 04/19/21 1304)  fentaNYL (SUBLIMAZE) injection 50 mcg (50 mcg Intravenous Given 04/19/21 1446)  piperacillin-tazobactam (ZOSYN) IVPB 3.375 g (3.375 g Intravenous New Bag/Given 04/19/21 1447)    ED Course  I have reviewed the triage vital signs and the nursing notes.  Pertinent labs & imaging results that were available during my care of the patient were reviewed by me and considered in my medical decision making (see chart for details).  55 year old here for evaluation of right-sided chest and abdominal pain.  On arrival patient is febrile however does not appear septic.  She is hypoxic to 87%  on room air with good waveform. Place on 2 l [place]. Does have any clinical evidence of DVT on exam.  Does not appear fluid overloaded.  Her heart and lungs are clear.  She has diffuse tenderness to her right anterior and lateral ribs and into her right upper abdomen however is negative Murphy sign.  She has no overlying skin changes to suggest cellulitis or abscess to her chest or abdomen.  She is neurovascularly intact.  Given hypoxia, fever, pleuritic pain concern for PE versus infectious process.  Plan on labs, imaging and reassess  Labs and imaging personally reviewed and interpreted:  DG chest without acute abnormality CBC leukocytosis at 11.8 CMP alk phos 129, no additional electrolyte, renal or liver abnormalities Lipase 42 Lactic 0.7 BC pending Trop 3 COVID, flu negative   Patient reasessed. Comfortable at this time on supplemental oxygen. Pending CTs  CTA chest without large PE however difficult due to body habitus CT AP with gallbladder wall thickening, stone at neck. Intrahepatic and extrahepatic dilation.  Patient reassessed. Continued pain. Will order additional pain meds and Abx for acute cholecystitis.  Patient will need to be admitted for acute hypoxic respiratory failure due to unknown etiology as well as cholecystitis   CONSULT with APP with surgery who states MD will  assess.  Discussed with PA that patient does have new oxygen requirement of 2 L here.  They will assess the patient can be under hospital service versus medicine service. Question hypoxia from lack of deep breathing secondary to pain given reassuring CTA wo PE, infectious process and clear lung sounds.   The patient appears reasonably stabilized for admission considering the current resources, flow, and capabilities available in the ED at this time, and I doubt any other [facility] requiring further screening and/or treatment in the ED prior to admission.   Patient seen eval by attending, Dr. Roslynn Amble who agrees above treatment, plan and disposition    MDM Rules/Calculators/A&P                           Final Clinical Impression(s) / ED Diagnoses Final diagnoses:  Acute respiratory failure with hypoxia [facility])  Acute cholecystitis    Rx / DC Orders ED Discharge Orders    None       Nashayla Telleria A, PA-C 04/19/21 1647    Lucrezia Starch, MD 04/20/21 [phone]

## 2021-04-19 NOTE — ED Triage Notes (Signed)
Per EMS, pt complains of right mid-axillary pain since last night. Pain is worse with inspiration. No cough.

## 2021-04-19 NOTE — ED Notes (Signed)
Pt aware of urine specimen

## 2021-04-19 NOTE — H&P (Signed)
Holly Roberts is an 55 y.o. female.   Chief Complaint: Chest pain  HPI: This is a 55 year old female who presented earlier with right chest pain and worsening pain with breathing.  She had had a similar episode earlier in the week and presented to the emergency department.  This time she underwent a CT angio of the chest for a suspected PE.  This was negative.  She then underwent a CAT scan of the abdomen and pelvis showing findings consistent with possible cholecystitis and gallstones.  Upon further questioning, her pain is in the right upper quadrant of her abdomen as well.  Again it is sharp and going into the chest.  She has no nausea or vomiting.  Her pain has improved with pain medications.  She has had no previous attacks of biliary colic in the past.  She denies fevers.  She denies jaundice.  She has no issues with previous anesthesia.  She has no previous history of cardiopulmonary issues  Past Medical History:  Diagnosis Date  . Hypertension     Past Surgical History:  Procedure Laterality Date  . Right knee surgery    . TONSILLECTOMY      History reviewed. No pertinent family history. Social History:  reports that she has never smoked. She has never used smokeless tobacco. She reports that she does not drink alcohol and does not use drugs.  Allergies:  Allergies  Allergen Reactions  . Buprenorphine Rash    Patch gave rash- poss adhesive issues. Tried generic and brand name both. Patient states: "I am not allergic to the medication. I am allergic to the adhesive, not the medication."   . Amoxicillin-Pot Clavulanate     Other reaction(s): Other (See Comments) States "strong" antibiotics cause C diff  . Sulfa Antibiotics     (Not in a hospital admission)   Results for orders placed or performed during the hospital encounter of 04/19/21 (from the past 48 hour(s))  CBC with Differential     Status: Abnormal   Collection Time: 04/19/21 10:34 AM  Result Value Ref Range   WBC  11.8 (H) 4.0 - 10.5 K/uL   RBC 4.24 3.87 - 5.11 MIL/uL   Hemoglobin 12.0 12.0 - 15.0 g/dL   HCT 16.1 09.6 - 04.5 %   MCV 86.8 80.0 - 100.0 fL   MCH 28.3 26.0 - 34.0 pg   MCHC 32.6 30.0 - 36.0 g/dL   RDW 40.9 81.1 - 91.4 %   Platelets 182 150 - 400 K/uL   nRBC 0.0 0.0 - 0.2 %   Neutrophils Relative % 90 %   Neutro Abs 10.5 (H) 1.7 - 7.7 K/uL   Lymphocytes Relative 5 %   Lymphs Abs 0.6 (L) 0.7 - 4.0 K/uL   Monocytes Relative 5 %   Monocytes Absolute 0.6 0.1 - 1.0 K/uL   Eosinophils Relative 0 %   Eosinophils Absolute 0.0 0.0 - 0.5 K/uL   Basophils Relative 0 %   Basophils Absolute 0.0 0.0 - 0.1 K/uL   Immature Granulocytes 0 %   Abs Immature Granulocytes 0.05 0.00 - 0.07 K/uL    Comment: Performed at [facility], [address] [place], [place] [address]  Comprehensive metabolic panel     Status: Abnormal   Collection Time: 04/19/21 10:34 AM  Result Value Ref Range   Sodium 137 135 - 145 mmol/L   Potassium 3.5 3.5 - 5.1 mmol/L   Chloride 103 98 - 111 mmol/L   CO2 25 22 -  32 mmol/L   Glucose, Bld 97 70 - 99 mg/dL    Comment: Glucose reference range applies only to samples taken after fasting for at least 8 hours.   BUN 13 6 - 20 mg/dL   Creatinine, Ser 1.610.93 0.44 - 1.00 mg/dL   Calcium 8.8 (L) 8.9 - 10.3 mg/dL   Total Protein 7.1 6.5 - 8.1 g/dL   Albumin 3.9 3.5 - 5.0 g/dL   AST 20 15 - 41 U/L   ALT 30 0 - 44 U/L   Alkaline Phosphatase 129 (H) 38 - 126 U/L   Total Bilirubin 0.9 0.3 - 1.2 mg/dL   GFR, Estimated >09>60 >60>60 mL/min    Comment: (NOTE) Calculated using the CKD-EPI Creatinine Equation (2021)    Anion gap 9 5 - 15    Comment: Performed at [facility], [address] [place], [place] [address]  Lipase, blood     Status: None   Collection Time: 04/19/21 10:34 AM  Result Value Ref Range   Lipase 42 11 - 51 U/L    Comment: Performed at [facility], [address] [place], [place] [address]  Troponin I  (High Sensitivity)     Status: None   Collection Time: 04/19/21 10:34 AM  Result Value Ref Range   Troponin I (High Sensitivity) 3 <18 ng/L    Comment: (NOTE) Elevated high sensitivity troponin I (hsTnI) values and significant  changes across serial measurements may suggest ACS but many other  chronic and acute conditions are known to elevate hsTnI results.  Refer to the "Links" section for chest pain algorithms and additional  guidance. Performed at [facility], [address] [place], [place] [address]   Resp Panel by RT-PCR (Flu A&B, Covid) Nasopharyngeal Swab     Status: None   Collection Time: 04/19/21 10:34 AM   Specimen: Nasopharyngeal Swab; Nasopharyngeal(NP) swabs in vial transport medium  Result Value Ref Range   SARS Coronavirus 2 by RT PCR NEGATIVE NEGATIVE    Comment: (NOTE) SARS-CoV-2 target nucleic acids are NOT DETECTED.  The SARS-CoV-2 RNA is generally detectable in upper respiratory specimens during the acute phase of infection. The lowest concentration of SARS-CoV-2 viral copies this assay can detect is 138 copies/mL. A negative result does not preclude SARS-Cov-2 infection and should not be used as the sole basis for treatment or other patient management decisions. A negative result may occur with  improper specimen collection/handling, submission of specimen other than nasopharyngeal swab, presence of viral mutation(s) within the areas targeted by this assay, and inadequate number of viral copies(<138 copies/mL). A negative result must be combined with clinical observations, patient history, and epidemiological information. The expected result is Negative.  Fact Sheet for Patients:  BloggerCourse.comhttps://www.fda.gov/media/152166/download  Fact Sheet for Healthcare Providers:  SeriousBroker.ithttps://www.fda.gov/media/152162/download  This test is no t yet approved or cleared by the [place] FDA and  has been authorized for detection and/or diagnosis of  SARS-CoV-2 by FDA under an Emergency Use Authorization (EUA). This EUA will remain  in effect (meaning this test can be used) for the duration of the COVID-19 declaration under Section 564(b)(1) of the Act, 21 U.S.C.section 360bbb-3(b)(1), unless the authorization is terminated  or revoked sooner.       Influenza A by PCR NEGATIVE NEGATIVE   Influenza B by PCR NEGATIVE NEGATIVE    Comment: (NOTE) The Xpert Xpress SARS-CoV-2/FLU/RSV plus assay is intended as an aid in the diagnosis of influenza from Nasopharyngeal swab specimens and  should not be used as a sole basis for treatment. Nasal washings and aspirates are unacceptable for Xpert Xpress SARS-CoV-2/FLU/RSV testing.  Fact Sheet for Patients: BloggerCourse.com  Fact Sheet for Healthcare Providers: SeriousBroker.it  This test is not yet approved or cleared by the [place] FDA and has been authorized for detection and/or diagnosis of SARS-CoV-2 by FDA under an Emergency Use Authorization (EUA). This EUA will remain in effect (meaning this test can be used) for the duration of the COVID-19 declaration under Section 564(b)(1) of the Act, 21 U.S.C. section 360bbb-3(b)(1), unless the authorization is terminated or revoked.  Performed at [facility], [address] [place], [place] [address]   Lactic acid, plasma     Status: None   Collection Time: 04/19/21 10:35 AM  Result Value Ref Range   Lactic Acid, Venous 0.7 0.5 - 1.9 mmol/L    Comment: Performed at [facility], [address] [place], [place] [address]  Troponin I (High Sensitivity)     Status: None   Collection Time: 04/19/21 12:24 PM  Result Value Ref Range   Troponin I (High Sensitivity) 3 <18 ng/L    Comment: (NOTE) Elevated high sensitivity troponin I (hsTnI) values and significant  changes across serial measurements may suggest ACS but many other  chronic and acute  conditions are known to elevate hsTnI results.  Refer to the "Links" section for chest pain algorithms and additional  guidance. Performed at [facility], [address] [place], [place] [address]   Urinalysis, Routine w reflex microscopic Urine, Clean Catch     Status: Abnormal   Collection Time: 04/19/21 12:55 PM  Result Value Ref Range   Color, Urine AMBER (A) YELLOW    Comment: BIOCHEMICALS MAY BE AFFECTED BY COLOR   APPearance CLEAR CLEAR   Specific Gravity, Urine 1.024 1.005 - 1.030   pH 5.0 5.0 - 8.0   Glucose, UA NEGATIVE NEGATIVE mg/dL   Hgb urine dipstick SMALL (A) NEGATIVE   Bilirubin Urine NEGATIVE NEGATIVE   Ketones, ur 5 (A) NEGATIVE mg/dL   Protein, ur 30 (A) NEGATIVE mg/dL   Nitrite NEGATIVE NEGATIVE   Leukocytes,Ua MODERATE (A) NEGATIVE   RBC / HPF 0-5 0 - 5 RBC/hpf   WBC, UA 21-50 0 - 5 WBC/hpf   Bacteria, UA NONE SEEN NONE SEEN   Squamous Epithelial / LPF 0-5 0 - 5   Mucus PRESENT     Comment: Performed at [facility], [address] [place], [place] [address]   CT Angio Chest PE W/Cm &/Or Wo Cm  Result Date: 04/19/2021 CLINICAL DATA:  PE suspected right lower chest pain, fever EXAM: CT ANGIOGRAPHY CHEST WITH CONTRAST TECHNIQUE: Multidetector CT imaging of the chest was performed using the standard protocol during bolus administration of intravenous contrast. Multiplanar CT image reconstructions and MIPs were obtained to evaluate the vascular anatomy. CONTRAST:  OMNIPAQUE IOHEXOL 350 MG/ML SOLN COMPARISON:  None. FINDINGS: Cardiovascular: Examination for pulmonary embolism is limited by body habitus and breath motion artifact. Within this limitation, no evidence of pulmonary embolism through the proximal segmental pulmonary arterial level. Cardiomegaly. Scattered left coronary artery calcifications. No pericardial effusion. Mediastinum/Nodes: No enlarged mediastinal, hilar, or axillary lymph nodes. Thyroid gland, trachea,  and esophagus demonstrate no significant findings. Lungs/Pleura: Trace bilateral pleural effusions and associated atelectasis or consolidation. Upper Abdomen: Please see separately reported examination of the abdomen and pelvis. Musculoskeletal: No chest wall abnormality. No acute or significant osseous findings. Review  of the MIP images confirms the above findings. IMPRESSION: 1. Examination for pulmonary embolism is limited by body habitus and breath motion artifact. Within this limitation, no evidence of pulmonary embolism through the proximal segmental pulmonary arterial level. 2. Trace bilateral pleural effusions and associated atelectasis or consolidation. 3. Cardiomegaly and coronary artery disease. Electronically Signed   By: Lauralyn Primes M.D.   On: 04/19/2021 13:59   CT Abdomen Pelvis W Contrast  Result Date: 04/19/2021 CLINICAL DATA:  Abdominal pain and fever EXAM: CT ABDOMEN AND PELVIS WITH CONTRAST TECHNIQUE: Multidetector CT imaging of the abdomen and pelvis was performed using the standard protocol following bolus administration of intravenous contrast. CONTRAST:  OMNIPAQUE IOHEXOL 350 MG/ML SOLN COMPARISON:  None. FINDINGS: Lower chest: No acute abnormality. Hepatobiliary: No solid liver abnormality is seen. Gallbladder wall thickening and trace pericholecystic fluid. Faintly rim calcified gallstone measuring 1.4 cm near the gallbladder neck (series 2, image 25). Intra and extrahepatic biliary ductal dilatation, the central common bile duct measuring up to 1.1 cm in caliber. No obstructing calculus identified to the ampulla (series 4, image 71). Pancreas: Unremarkable. No pancreatic ductal dilatation or surrounding inflammatory changes. Spleen: Normal in size without significant abnormality. Adrenals/Urinary Tract: Adrenal glands are unremarkable. Kidneys are normal, without renal calculi, solid lesion, or hydronephrosis. Bladder is unremarkable. Stomach/Bowel: Stomach is within normal  limits. Appendix appears normal. No evidence of bowel wall thickening, distention, or inflammatory changes. Vascular/Lymphatic: No significant vascular findings are present. No enlarged abdominal or pelvic lymph nodes. Reproductive: No mass or other significant abnormality. Other: No abdominal wall hernia or abnormality. Trace fluid in the low pelvis. Musculoskeletal: No acute or significant osseous findings. IMPRESSION: 1. Gallbladder wall thickening and trace pericholecystic fluid. Faintly rim calcified gallstone measuring 1.4 cm near the gallbladder neck. Findings are concerning for acute cholecystitis. 2. Intra- and extrahepatic biliary ductal dilatation, the central common bile duct measuring up to 1.1 cm in caliber. No obstructing calculus identified to the ampulla. A recently passed calculus or a noncalcified calculus could produce this appearance. 3. Trace fluid in the low pelvis, likely reactive. Electronically Signed   By: Lauralyn Primes M.D.   On: 04/19/2021 13:56   DG Chest Portable 1 View  Result Date: 04/19/2021 CLINICAL DATA:  RIGHT lower chest pain and hypoxia, RIGHT mid axillary pain since last night worse with inspiration EXAM: PORTABLE CHEST 1 VIEW COMPARISON:  Portable exam 1003 hours compared to 04/17/2021 FINDINGS: Upper normal heart size. Mediastinal contours and pulmonary vascularity normal. Decreased lung volumes with bibasilar atelectasis. Remaining lungs clear. No acute infiltrate, pleural effusion or pneumothorax. LEFT glenohumeral degenerative changes. IMPRESSION: Decreased lung volumes with bibasilar atelectasis. Electronically Signed   By: Ulyses Southward M.D.   On: 04/19/2021 10:25   DG Chest Port 1 View  Result Date: 04/17/2021 CLINICAL DATA:  Chest pain EXAM: PORTABLE CHEST 1 VIEW COMPARISON:  None. FINDINGS: The heart size and mediastinal contours are within normal limits. Both lungs are clear. The visualized skeletal structures are unremarkable. IMPRESSION: No active disease.  Electronically Signed   By: Jasmine Pang M.D.   On: 04/17/2021 20:15    Review of Systems  Constitutional: Negative for chills and fatigue.  Cardiovascular: Positive for chest pain.  Gastrointestinal: Positive for abdominal pain. Negative for diarrhea, nausea and vomiting.  Genitourinary: Negative for difficulty urinating and dysuria.  All other systems reviewed and are negative.   Blood pressure (!) 149/83, pulse (!) 113, temperature (!) 101 F (38.3 C), temperature source Oral, resp. rate  18, SpO2 99 %. Physical Exam Constitutional:      General: She is not in acute distress.    Appearance: She is obese. She is not diaphoretic.  HENT:     Head: Normocephalic and atraumatic.     Right Ear: External ear normal.     Left Ear: External ear normal.     Nose: Nose normal.     Mouth/Throat:     Pharynx: Oropharynx is clear.  Eyes:     General: No scleral icterus.    Pupils: Pupils are equal, round, and reactive to light.  Neck:     Trachea: No tracheal deviation.  Cardiovascular:     Rate and Rhythm: Normal rate and regular rhythm.     Pulses: Normal pulses.     Heart sounds: Normal heart sounds.  Pulmonary:     Effort: Pulmonary effort is normal.     Breath sounds: Normal breath sounds.  Abdominal:     General: Abdomen is flat. There is no distension.     Tenderness: There is abdominal tenderness. There is guarding.     Comments: There is mild tenderness with guarding in the right upper quadrant  There is no hepatomegaly  There are no obvious hernias  Musculoskeletal:        General: No swelling. Normal range of motion.     Cervical back: Neck supple.  Skin:    General: Skin is warm and dry.     Coloration: Skin is not jaundiced.     Findings: No erythema.  Neurological:     General: No focal deficit present.     Mental Status: She is alert.  Psychiatric:        Mood and Affect: Mood normal.        Behavior: Behavior normal.      Assessment/Plan Acute  cholecystitis with cholelithiasis  I discussed the diagnosis with the patient and her family.  We discussed gallbladder disease.  Admission to the hospital for IV antibiotics and possible laparoscopic cholecystectomy with cholangiogram for tomorrow is recommended.  I discussed the reasons for this with them in detail. I discussed the procedure in detail.  We discussed the risks and benefits of a laparoscopic cholecystectomy and cholangiogram including, but not limited to bleeding, infection, injury to surrounding structures such as the intestine or liver, bile leak, retained gallstones, need to convert to an open procedure, prolonged diarrhea, blood clots such as  DVT, common bile duct injury, anesthesia risks, and possible need for additional procedures.  The likelihood of improvement in symptoms and return to the patient's normal status is good. We discussed the typical post-operative recovery course. She agrees with plan.  IV antibiotics have been started  [name], MD 04/19/2021, 3:46 PM

## 2021-04-19 NOTE — ED Notes (Signed)
ED TO INPATIENT HANDOFF REPORT  Name/Age/Gender Holly SarksJeanine Roberts 55 y.o. female  Code Status    Code Status Orders  (From admission, onward)         Start     Ordered   04/19/21 1633  Full code  Continuous        04/19/21 1632        Code Status History    This patient has a current code status but no historical code status.   Advance Care Planning Activity      Home/SNF/Other Home  Chief Complaint Cholecystitis with cholelithiasis [K80.10]  Level of Care/Admitting Diagnosis ED Disposition    ED Disposition Condition Comment   Admit  [facility] [[id number]]  Level of Care: Med-Surg [16]  Covid Evaluation: Confirmed COVID Negative  Diagnosis: Cholecystitis with cholelithiasis [161096][[id number]]  Admitting Physician: Abigail MiyamotoBLACKMAN, DOUGLAS [[id number]]  Attending Physician: CCS, MD [[id number]]       Medical History Past Medical History:  Diagnosis Date  . Hypertension     Allergies Allergies  Allergen Reactions  . Buprenorphine Rash    Patch gave rash- poss adhesive issues. Tried generic and brand name both. Patient states: "I am not allergic to the medication. I am allergic to the adhesive, not the medication."   . Amoxicillin-Pot Clavulanate     Other reaction(s): Other (See Comments) States "strong" antibiotics cause C diff  . Sulfa Antibiotics     IV Location/Drains/Wounds Patient Lines/Drains/Airways Status    Active Line/Drains/Airways    Name Placement date Placement time Site Days   Peripheral IV 04/19/21 Anterior;Left;Proximal Forearm 04/19/21  1038  Forearm  less than 1   Peripheral IV 04/19/21 Left Hand 04/19/21  1216  Hand  less than 1          Labs/Imaging Results for orders placed or performed during the hospital encounter of 04/19/21 (from the past 48 hour(s))  CBC with Differential     Status: Abnormal   Collection Time: 04/19/21 10:34 AM  Result Value Ref Range   WBC 11.8 (H) 4.0 - 10.5 K/uL   RBC 4.24 3.87 - 5.11 MIL/uL    Hemoglobin 12.0 12.0 - 15.0 g/dL   HCT 04.536.8 40.936.0 - 81.146.0 %   MCV 86.8 80.0 - 100.0 fL   MCH 28.3 26.0 - 34.0 pg   MCHC 32.6 30.0 - 36.0 g/dL   RDW 91.412.3 78.211.5 - 95.615.5 %   Platelets 182 150 - 400 K/uL   nRBC 0.0 0.0 - 0.2 %   Neutrophils Relative % 90 %   Neutro Abs 10.5 (H) 1.7 - 7.7 K/uL   Lymphocytes Relative 5 %   Lymphs Abs 0.6 (L) 0.7 - 4.0 K/uL   Monocytes Relative 5 %   Monocytes Absolute 0.6 0.1 - 1.0 K/uL   Eosinophils Relative 0 %   Eosinophils Absolute 0.0 0.0 - 0.5 K/uL   Basophils Relative 0 %   Basophils Absolute 0.0 0.0 - 0.1 K/uL   Immature Granulocytes 0 %   Abs Immature Granulocytes 0.05 0.00 - 0.07 K/uL    Comment: Performed at [facility], [address] [place], [place] [id number]  Comprehensive metabolic panel     Status: Abnormal   Collection Time: 04/19/21 10:34 AM  Result Value Ref Range   Sodium 137 135 - 145 mmol/L   Potassium 3.5 3.5 - 5.1 mmol/L   Chloride 103 98 - 111 mmol/L   CO2 25 22 - 32 mmol/L   Glucose, Bld  97 70 - 99 mg/dL    Comment: Glucose reference range applies only to samples taken after fasting for at least 8 hours.   BUN 13 6 - 20 mg/dL   Creatinine, Ser 3.71 0.44 - 1.00 mg/dL   Calcium 8.8 (L) 8.9 - 10.3 mg/dL   Total Protein 7.1 6.5 - 8.1 g/dL   Albumin 3.9 3.5 - 5.0 g/dL   AST 20 15 - 41 U/L   ALT 30 0 - 44 U/L   Alkaline Phosphatase 129 (H) 38 - 126 U/L   Total Bilirubin 0.9 0.3 - 1.2 mg/dL   GFR, Estimated >06 >26 mL/min    Comment: (NOTE) Calculated using the CKD-EPI Creatinine Equation (2021)    Anion gap 9 5 - 15    Comment: Performed at [facility], [address], [place], [place] [address]  Lipase, blood     Status: None   Collection Time: 04/19/21 10:34 AM  Result Value Ref Range   Lipase 42 11 - 51 U/L    Comment: Performed at [facility], [address], [place], [place] [address]  Troponin I (High Sensitivity)     Status: None   Collection Time:  04/19/21 10:34 AM  Result Value Ref Range   Troponin I (High Sensitivity) 3 <18 ng/L    Comment: (NOTE) Elevated high sensitivity troponin I (hsTnI) values and significant  changes across serial measurements may suggest ACS but many other  chronic and acute conditions are known to elevate hsTnI results.  Refer to the "Links" section for chest pain algorithms and additional  guidance. Performed at [facility], [address], [place], [place] [address]   Resp Panel by RT-PCR (Flu A&B, Covid) Nasopharyngeal Swab     Status: None   Collection Time: 04/19/21 10:34 AM   Specimen: Nasopharyngeal Swab; Nasopharyngeal(NP) swabs in vial transport medium  Result Value Ref Range   SARS Coronavirus 2 by RT PCR NEGATIVE NEGATIVE    Comment: (NOTE) SARS-CoV-2 target nucleic acids are NOT DETECTED.  The SARS-CoV-2 RNA is generally detectable in upper respiratory specimens during the acute phase of infection. The lowest concentration of SARS-CoV-2 viral copies this assay can detect is 138 copies/mL. A negative result does not preclude SARS-Cov-2 infection and should not be used as the sole basis for treatment or other patient management decisions. A negative result may occur with  improper specimen collection/handling, submission of specimen other than nasopharyngeal swab, presence of viral mutation(s) within the areas targeted by this assay, and inadequate number of viral copies(<138 copies/mL). A negative result must be combined with clinical observations, patient history, and epidemiological information. The expected result is Negative.  Fact Sheet for Patients:  BloggerCourse.com  Fact Sheet for Healthcare Providers:  SeriousBroker.it  This test is no t yet approved or cleared by the [place] FDA and  has been authorized for detection and/or diagnosis of SARS-CoV-2 by FDA under an Emergency Use Authorization (EUA).  This EUA will remain  in effect (meaning this test can be used) for the duration of the COVID-19 declaration under Section 564(b)(1) of the Act, 21 U.S.C.section 360bbb-3(b)(1), unless the authorization is terminated  or revoked sooner.       Influenza A by PCR NEGATIVE NEGATIVE   Influenza B by PCR NEGATIVE NEGATIVE    Comment: (NOTE) The Xpert Xpress SARS-CoV-2/FLU/RSV plus assay is intended as an aid in the diagnosis of influenza from Nasopharyngeal swab specimens and should not be used as a  sole basis for treatment. Nasal washings and aspirates are unacceptable for Xpert Xpress SARS-CoV-2/FLU/RSV testing.  Fact Sheet for Patients: BloggerCourse.com  Fact Sheet for Healthcare Providers: SeriousBroker.it  This test is not yet approved or cleared by the [place] FDA and has been authorized for detection and/or diagnosis of SARS-CoV-2 by FDA under an Emergency Use Authorization (EUA). This EUA will remain in effect (meaning this test can be used) for the duration of the COVID-19 declaration under Section 564(b)(1) of the Act, 21 U.S.C. section 360bbb-3(b)(1), unless the authorization is terminated or revoked.  Performed at [facility], [address] [place], [place] [address]   Lactic acid, plasma     Status: None   Collection Time: 04/19/21 10:35 AM  Result Value Ref Range   Lactic Acid, Venous 0.7 0.5 - 1.9 mmol/L    Comment: Performed at [facility], [address] [place], [place] [address]  Troponin I (High Sensitivity)     Status: None   Collection Time: 04/19/21 12:24 PM  Result Value Ref Range   Troponin I (High Sensitivity) 3 <18 ng/L    Comment: (NOTE) Elevated high sensitivity troponin I (hsTnI) values and significant  changes across serial measurements may suggest ACS but many other  chronic and acute conditions are known to elevate hsTnI results.  Refer to the  "Links" section for chest pain algorithms and additional  guidance. Performed at [facility], [address], [place], [place] [address]   Urinalysis, Routine w reflex microscopic Urine, Clean Catch     Status: Abnormal   Collection Time: 04/19/21 12:55 PM  Result Value Ref Range   Color, Urine AMBER (A) YELLOW    Comment: BIOCHEMICALS MAY BE AFFECTED BY COLOR   APPearance CLEAR CLEAR   Specific Gravity, Urine 1.024 1.005 - 1.030   pH 5.0 5.0 - 8.0   Glucose, UA NEGATIVE NEGATIVE mg/dL   Hgb urine dipstick SMALL (A) NEGATIVE   Bilirubin Urine NEGATIVE NEGATIVE   Ketones, ur 5 (A) NEGATIVE mg/dL   Protein, ur 30 (A) NEGATIVE mg/dL   Nitrite NEGATIVE NEGATIVE   Leukocytes,Ua MODERATE (A) NEGATIVE   RBC / HPF 0-5 0 - 5 RBC/hpf   WBC, UA 21-50 0 - 5 WBC/hpf   Bacteria, UA NONE SEEN NONE SEEN   Squamous Epithelial / LPF 0-5 0 - 5   Mucus PRESENT     Comment: Performed at [facility], [address], [place], [place] [address]   CT Angio Chest PE W/Cm &/Or Wo Cm  Result Date: 04/19/2021 CLINICAL DATA:  PE suspected right lower chest pain, fever EXAM: CT ANGIOGRAPHY CHEST WITH CONTRAST TECHNIQUE: Multidetector CT imaging of the chest was performed using the standard protocol during bolus administration of intravenous contrast. Multiplanar CT image reconstructions and MIPs were obtained to evaluate the vascular anatomy. CONTRAST:  OMNIPAQUE IOHEXOL 350 MG/ML SOLN COMPARISON:  None. FINDINGS: Cardiovascular: Examination for pulmonary embolism is limited by body habitus and breath motion artifact. Within this limitation, no evidence of pulmonary embolism through the proximal segmental pulmonary arterial level. Cardiomegaly. Scattered left coronary artery calcifications. No pericardial effusion. Mediastinum/Nodes: No enlarged mediastinal, hilar, or axillary lymph nodes. Thyroid gland, trachea, and esophagus demonstrate no significant findings.  Lungs/Pleura: Trace bilateral pleural effusions and associated atelectasis or consolidation. Upper Abdomen: Please see separately reported examination of the abdomen and pelvis. Musculoskeletal: No chest wall abnormality. No acute or significant osseous findings. Review of the MIP images confirms the  above findings. IMPRESSION: 1. Examination for pulmonary embolism is limited by body habitus and breath motion artifact. Within this limitation, no evidence of pulmonary embolism through the proximal segmental pulmonary arterial level. 2. Trace bilateral pleural effusions and associated atelectasis or consolidation. 3. Cardiomegaly and coronary artery disease. Electronically Signed   By: Lauralyn Primes M.D.   On: 04/19/2021 13:59   CT Abdomen Pelvis W Contrast  Result Date: 04/19/2021 CLINICAL DATA:  Abdominal pain and fever EXAM: CT ABDOMEN AND PELVIS WITH CONTRAST TECHNIQUE: Multidetector CT imaging of the abdomen and pelvis was performed using the standard protocol following bolus administration of intravenous contrast. CONTRAST:  OMNIPAQUE IOHEXOL 350 MG/ML SOLN COMPARISON:  None. FINDINGS: Lower chest: No acute abnormality. Hepatobiliary: No solid liver abnormality is seen. Gallbladder wall thickening and trace pericholecystic fluid. Faintly rim calcified gallstone measuring 1.4 cm near the gallbladder neck (series 2, image 25). Intra and extrahepatic biliary ductal dilatation, the central common bile duct measuring up to 1.1 cm in caliber. No obstructing calculus identified to the ampulla (series 4, image 71). Pancreas: Unremarkable. No pancreatic ductal dilatation or surrounding inflammatory changes. Spleen: Normal in size without significant abnormality. Adrenals/Urinary Tract: Adrenal glands are unremarkable. Kidneys are normal, without renal calculi, solid lesion, or hydronephrosis. Bladder is unremarkable. Stomach/Bowel: Stomach is within normal limits. Appendix appears normal. No evidence of bowel  wall thickening, distention, or inflammatory changes. Vascular/Lymphatic: No significant vascular findings are present. No enlarged abdominal or pelvic lymph nodes. Reproductive: No mass or other significant abnormality. Other: No abdominal wall hernia or abnormality. Trace fluid in the low pelvis. Musculoskeletal: No acute or significant osseous findings. IMPRESSION: 1. Gallbladder wall thickening and trace pericholecystic fluid. Faintly rim calcified gallstone measuring 1.4 cm near the gallbladder neck. Findings are concerning for acute cholecystitis. 2. Intra- and extrahepatic biliary ductal dilatation, the central common bile duct measuring up to 1.1 cm in caliber. No obstructing calculus identified to the ampulla. A recently passed calculus or a noncalcified calculus could produce this appearance. 3. Trace fluid in the low pelvis, likely reactive. Electronically Signed   By: Lauralyn Primes M.D.   On: 04/19/2021 13:56   DG Chest Portable 1 View  Result Date: 04/19/2021 CLINICAL DATA:  RIGHT lower chest pain and hypoxia, RIGHT mid axillary pain since last night worse with inspiration EXAM: PORTABLE CHEST 1 VIEW COMPARISON:  Portable exam 1003 hours compared to 04/17/2021 FINDINGS: Upper normal heart size. Mediastinal contours and pulmonary vascularity normal. Decreased lung volumes with bibasilar atelectasis. Remaining lungs clear. No acute infiltrate, pleural effusion or pneumothorax. LEFT glenohumeral degenerative changes. IMPRESSION: Decreased lung volumes with bibasilar atelectasis. Electronically Signed   By: Ulyses Southward M.D.   On: 04/19/2021 10:25   DG Chest Port 1 View  Result Date: 04/17/2021 CLINICAL DATA:  Chest pain EXAM: PORTABLE CHEST 1 VIEW COMPARISON:  None. FINDINGS: The heart size and mediastinal contours are within normal limits. Both lungs are clear. The visualized skeletal structures are unremarkable. IMPRESSION: No active disease. Electronically Signed   By: Jasmine Pang M.D.   On:  04/17/2021 20:15    Pending Labs Unresulted Labs (From admission, onward)          Start     Ordered   04/20/21 0500  Comprehensive metabolic panel  Tomorrow morning,   R        04/19/21 1632   04/20/21 0500  CBC  Tomorrow morning,   R        04/19/21 1632   04/19/21 7622  Blood culture (routine x 2)  BLOOD CULTURE X 2,   STAT      04/19/21 0953   Signed and Held  HIV Antibody (routine testing w rflx)  (HIV Antibody (Routine testing w reflex) panel)  Once,   R        Signed and Held          Vitals/Pain Today's Vitals   04/19/21 1436 04/19/21 1445 04/19/21 1500 04/19/21 1515  BP: (!) 146/107  (!) 149/83   Pulse: 80 81 80 (!) 113  Resp: 18  18   Temp:      TempSrc:      SpO2: 93% 94% 93% 99%  PainSc:        Isolation Precautions Airborne and Contact precautions  Medications Medications  oxyCODONE (Oxy IR/ROXICODONE) immediate release tablet 5-10 mg (has no administration in time range)  HYDROmorphone (DILAUDID) injection 1 mg (has no administration in time range)  diphenhydrAMINE (BENADRYL) capsule 25 mg (has no administration in time range)    Or  diphenhydrAMINE (BENADRYL) injection 25 mg (has no administration in time range)  acetaminophen (TYLENOL) tablet 650 mg (has no administration in time range)    Or  acetaminophen (TYLENOL) suppository 650 mg (has no administration in time range)  ondansetron (ZOFRAN-ODT) disintegrating tablet 4 mg (has no administration in time range)    Or  ondansetron (ZOFRAN) injection 4 mg (has no administration in time range)  sodium chloride 0.9 % bolus 1,000 mL (1,000 mLs Intravenous New Bag/Given 04/19/21 1043)  ondansetron (ZOFRAN) injection 4 mg (4 mg Intravenous Given 04/19/21 1044)  fentaNYL (SUBLIMAZE) injection 50 mcg (50 mcg Intravenous Given 04/19/21 1044)  acetaminophen (TYLENOL) tablet 650 mg (650 mg Oral Given 04/19/21 1048)  iohexol (OMNIPAQUE) 350 MG/ML injection 100 mL (100 mLs Intravenous Contrast Given 04/19/21 1304)   fentaNYL (SUBLIMAZE) injection 50 mcg (50 mcg Intravenous Given 04/19/21 1446)  piperacillin-tazobactam (ZOSYN) IVPB 3.375 g (3.375 g Intravenous New Bag/Given 04/19/21 1447)    Mobility walks

## 2021-04-19 NOTE — ED Notes (Signed)
Patient transported to CT 

## 2021-04-20 ENCOUNTER — Observation Stay (HOSPITAL_COMMUNITY): Payer: BC Managed Care – PPO | Admitting: Anesthesiology

## 2021-04-20 ENCOUNTER — Encounter (HOSPITAL_COMMUNITY): Payer: Self-pay | Admitting: Surgery

## 2021-04-20 ENCOUNTER — Encounter (HOSPITAL_COMMUNITY): Admission: EM | Disposition: A | Discharge status: Home / Self Care | Payer: Self-pay | Source: Home / Self Care

## 2021-04-20 ENCOUNTER — Observation Stay (HOSPITAL_COMMUNITY): Payer: BC Managed Care – PPO

## 2021-04-20 DIAGNOSIS — I10 Essential (primary) hypertension: Secondary | ICD-10-CM | POA: Diagnosis present

## 2021-04-20 DIAGNOSIS — F419 Anxiety disorder, unspecified: Secondary | ICD-10-CM | POA: Diagnosis present

## 2021-04-20 DIAGNOSIS — K8001 Calculus of gallbladder with acute cholecystitis with obstruction: Secondary | ICD-10-CM | POA: Diagnosis present

## 2021-04-20 DIAGNOSIS — Z880 Allergy status to penicillin: Secondary | ICD-10-CM | POA: Diagnosis not present

## 2021-04-20 DIAGNOSIS — Z20822 Contact with and (suspected) exposure to covid-19: Secondary | ICD-10-CM | POA: Diagnosis present

## 2021-04-20 DIAGNOSIS — K810 Acute cholecystitis: Secondary | ICD-10-CM | POA: Diagnosis present

## 2021-04-20 DIAGNOSIS — Z79899 Other long term (current) drug therapy: Secondary | ICD-10-CM | POA: Diagnosis not present

## 2021-04-20 DIAGNOSIS — Z888 Allergy status to other drugs, medicaments and biological substances status: Secondary | ICD-10-CM | POA: Diagnosis not present

## 2021-04-20 HISTORY — PX: CHOLECYSTECTOMY: SHX55

## 2021-04-20 LAB — COMPREHENSIVE METABOLIC PANEL
ALT: 40 U/L (ref 0–44)
AST: 45 U/L — ABNORMAL HIGH (ref 15–41)
Albumin: 3.1 g/dL — ABNORMAL LOW (ref 3.5–5.0)
Alkaline Phosphatase: 174 U/L — ABNORMAL HIGH (ref 38–126)
Anion gap: 10 (ref 5–15)
BUN: 10 mg/dL (ref 6–20)
CO2: 25 mmol/L (ref 22–32)
Calcium: 8.6 mg/dL — ABNORMAL LOW (ref 8.9–10.3)
Chloride: 107 mmol/L (ref 98–111)
Creatinine, Ser: 0.84 mg/dL (ref 0.44–1.00)
GFR, Estimated: >60 mL/min (ref >60)
Glucose, Bld: 97 mg/dL (ref 70–99)
Potassium: 4.2 mmol/L (ref 3.5–5.1)
Sodium: 142 mmol/L (ref 135–145)
Total Bilirubin: 2.1 mg/dL — ABNORMAL HIGH (ref 0.3–1.2)
Total Protein: 6.1 g/dL — ABNORMAL LOW (ref 6.5–8.1)

## 2021-04-20 LAB — CBC
HCT: 33.4 % — ABNORMAL LOW (ref 36.0–46.0)
Hemoglobin: 10.5 g/dL — ABNORMAL LOW (ref 12.0–15.0)
MCH: 28.3 pg (ref 26.0–34.0)
MCHC: 31.4 g/dL (ref 30.0–36.0)
MCV: 90 fL (ref 80.0–100.0)
Platelets: 153 10*3/uL (ref 150–400)
RBC: 3.71 MIL/uL — ABNORMAL LOW (ref 3.87–5.11)
RDW: 12.2 % (ref 11.5–15.5)
WBC: 8.2 10*3/uL (ref 4.0–10.5)
nRBC: 0 % (ref 0.0–0.2)

## 2021-04-20 SURGERY — LAPAROSCOPIC CHOLECYSTECTOMY WITH INTRAOPERATIVE CHOLANGIOGRAM
Anesthesia: General

## 2021-04-20 MED ORDER — CALCIUM POLYCARBOPHIL 625 MG PO TABS
625.0000 mg | ORAL_TABLET | Freq: Two times a day (BID) | ORAL | Status: DC
  Administered 2021-04-20 – 2021-04-22 (×4): 625 mg via ORAL
  Filled 2021-04-20 (×6): qty 1

## 2021-04-20 MED ORDER — BUPIVACAINE HCL (PF) 0.5 % IJ SOLN
INTRAMUSCULAR | Status: DC | PRN
  Administered 2021-04-20: 24 mL

## 2021-04-20 MED ORDER — KETOROLAC TROMETHAMINE 30 MG/ML IJ SOLN
INTRAMUSCULAR | Status: AC
  Filled 2021-04-20: qty 1

## 2021-04-20 MED ORDER — PROPOFOL 10 MG/ML IV BOLUS
INTRAVENOUS | Status: DC | PRN
  Administered 2021-04-20: 160 mg via INTRAVENOUS

## 2021-04-20 MED ORDER — EPHEDRINE SULFATE-NACL 50-0.9 MG/10ML-% IV SOSY
PREFILLED_SYRINGE | INTRAVENOUS | Status: DC | PRN
  Administered 2021-04-20 (×2): 5 mg via INTRAVENOUS

## 2021-04-20 MED ORDER — IOHEXOL 300 MG/ML  SOLN
INTRAMUSCULAR | Status: DC | PRN
  Administered 2021-04-20: 16 mL

## 2021-04-20 MED ORDER — EPHEDRINE 5 MG/ML INJ
INTRAVENOUS | Status: AC
  Filled 2021-04-20: qty 10

## 2021-04-20 MED ORDER — LACTATED RINGERS IV SOLN: INTRAVENOUS | Status: DC

## 2021-04-20 MED ORDER — DEXAMETHASONE SODIUM PHOSPHATE 10 MG/ML IJ SOLN
INTRAMUSCULAR | Status: AC
  Filled 2021-04-20: qty 1

## 2021-04-20 MED ORDER — FENTANYL CITRATE (PF) 250 MCG/5ML IJ SOLN
INTRAMUSCULAR | Status: AC
  Filled 2021-04-20: qty 5

## 2021-04-20 MED ORDER — SODIUM CHLORIDE 0.9% FLUSH: 3.0000 mL | INTRAVENOUS | Status: DC | PRN

## 2021-04-20 MED ORDER — ROCURONIUM BROMIDE 10 MG/ML (PF) SYRINGE
PREFILLED_SYRINGE | INTRAVENOUS | Status: AC
  Filled 2021-04-20: qty 10

## 2021-04-20 MED ORDER — MIDAZOLAM HCL 5 MG/5ML IJ SOLN
INTRAMUSCULAR | Status: DC | PRN
  Administered 2021-04-20: 2 mg via INTRAVENOUS

## 2021-04-20 MED ORDER — PROCHLORPERAZINE EDISYLATE 10 MG/2ML IJ SOLN: 5.0000 mg | INTRAMUSCULAR | Status: DC | PRN

## 2021-04-20 MED ORDER — BUPIVACAINE HCL (PF) 0.5 % IJ SOLN
INTRAMUSCULAR | Status: AC
  Filled 2021-04-20: qty 30

## 2021-04-20 MED ORDER — SUCCINYLCHOLINE CHLORIDE 200 MG/10ML IV SOSY
PREFILLED_SYRINGE | INTRAVENOUS | Status: DC | PRN
  Administered 2021-04-20: 140 mg via INTRAVENOUS

## 2021-04-20 MED ORDER — ACETAMINOPHEN 10 MG/ML IV SOLN: 1000.0000 mg | Freq: Once | INTRAVENOUS | Status: DC | PRN

## 2021-04-20 MED ORDER — VALACYCLOVIR HCL 500 MG PO TABS
1000.0000 mg | ORAL_TABLET | Freq: Three times a day (TID) | ORAL | Status: DC
  Administered 2021-04-20 – 2021-04-22 (×8): 1000 mg via ORAL
  Filled 2021-04-20 (×12): qty 2

## 2021-04-20 MED ORDER — SCOPOLAMINE 1 MG/3DAYS TD PT72
1.0000 | MEDICATED_PATCH | TRANSDERMAL | Status: DC
  Administered 2021-04-20: 1.5 mg via TRANSDERMAL
  Filled 2021-04-20: qty 1

## 2021-04-20 MED ORDER — AMISULPRIDE (ANTIEMETIC) 5 MG/2ML IV SOLN
10.0000 mg | Freq: Once | INTRAVENOUS | Status: DC | PRN
Start: 2021-04-20 — End: 2021-04-20

## 2021-04-20 MED ORDER — LACTATED RINGERS IV BOLUS: 1000.0000 mL | Freq: Three times a day (TID) | INTRAVENOUS | Status: AC | PRN

## 2021-04-20 MED ORDER — KETOROLAC TROMETHAMINE 30 MG/ML IJ SOLN
30.0000 mg | Freq: Once | INTRAMUSCULAR | Status: AC
  Administered 2021-04-20: 30 mg via INTRAVENOUS

## 2021-04-20 MED ORDER — OXYCODONE HCL 5 MG/5ML PO SOLN: 5.0000 mg | Freq: Once | ORAL | Status: DC | PRN

## 2021-04-20 MED ORDER — LIDOCAINE 2% (20 MG/ML) 5 ML SYRINGE
INTRAMUSCULAR | Status: DC | PRN
  Administered 2021-04-20: 100 mg via INTRAVENOUS

## 2021-04-20 MED ORDER — METHOCARBAMOL 500 MG PO TABS
1000.0000 mg | ORAL_TABLET | Freq: Four times a day (QID) | ORAL | Status: DC | PRN
Start: 2021-04-20 — End: 2021-04-23
  Administered 2021-04-21 – 2021-04-22 (×4): 1000 mg via ORAL
  Filled 2021-04-20 (×5): qty 2

## 2021-04-20 MED ORDER — SUGAMMADEX SODIUM 200 MG/2ML IV SOLN
INTRAVENOUS | Status: DC | PRN
  Administered 2021-04-20: 200 mg via INTRAVENOUS
  Administered 2021-04-20 (×2): 100 mg via INTRAVENOUS

## 2021-04-20 MED ORDER — ACETAMINOPHEN 160 MG/5ML PO SOLN: 325.0000 mg | ORAL | Status: DC | PRN

## 2021-04-20 MED ORDER — ONDANSETRON HCL 4 MG/2ML IJ SOLN
INTRAMUSCULAR | Status: AC
  Filled 2021-04-20: qty 2

## 2021-04-20 MED ORDER — HYDROMORPHONE HCL 1 MG/ML IJ SOLN
0.5000 mg | INTRAMUSCULAR | Status: DC | PRN
  Administered 2021-04-20 – 2021-04-21 (×3): 1 mg via INTRAVENOUS
  Filled 2021-04-20 (×3): qty 1

## 2021-04-20 MED ORDER — METHOCARBAMOL 1000 MG/10ML IJ SOLN
1000.0000 mg | Freq: Four times a day (QID) | INTRAVENOUS | Status: DC | PRN
  Filled 2021-04-20: qty 10

## 2021-04-20 MED ORDER — MAGIC MOUTHWASH
15.0000 mL | Freq: Four times a day (QID) | ORAL | Status: DC | PRN
  Filled 2021-04-20: qty 15

## 2021-04-20 MED ORDER — FENTANYL CITRATE (PF) 100 MCG/2ML IJ SOLN
INTRAMUSCULAR | Status: DC | PRN
  Administered 2021-04-20 (×4): 50 ug via INTRAVENOUS
  Administered 2021-04-20: 100 ug via INTRAVENOUS
  Administered 2021-04-20: 50 ug via INTRAVENOUS

## 2021-04-20 MED ORDER — GLYCOPYRROLATE PF 0.2 MG/ML IJ SOSY
PREFILLED_SYRINGE | INTRAMUSCULAR | Status: DC | PRN
  Administered 2021-04-20: .2 mg via INTRAVENOUS

## 2021-04-20 MED ORDER — SODIUM CHLORIDE 0.9% FLUSH: 3.0000 mL | Freq: Two times a day (BID) | INTRAVENOUS | Status: DC

## 2021-04-20 MED ORDER — ROCURONIUM BROMIDE 10 MG/ML (PF) SYRINGE
PREFILLED_SYRINGE | INTRAVENOUS | Status: DC | PRN
  Administered 2021-04-20: 40 mg via INTRAVENOUS

## 2021-04-20 MED ORDER — FENTANYL CITRATE (PF) 100 MCG/2ML IJ SOLN
INTRAMUSCULAR | Status: AC
  Filled 2021-04-20: qty 4

## 2021-04-20 MED ORDER — FENTANYL CITRATE (PF) 100 MCG/2ML IJ SOLN
INTRAMUSCULAR | Status: AC
  Filled 2021-04-20: qty 2

## 2021-04-20 MED ORDER — LACTATED RINGERS IR SOLN
Status: DC | PRN
  Administered 2021-04-20: 1000 mL

## 2021-04-20 MED ORDER — MIDAZOLAM HCL 2 MG/2ML IJ SOLN
INTRAMUSCULAR | Status: AC
  Filled 2021-04-20: qty 2

## 2021-04-20 MED ORDER — ONDANSETRON HCL 4 MG/2ML IJ SOLN
INTRAMUSCULAR | Status: DC | PRN
  Administered 2021-04-20: 4 mg via INTRAVENOUS

## 2021-04-20 MED ORDER — OXYCODONE HCL 5 MG PO TABS: 5.0000 mg | ORAL_TABLET | Freq: Once | ORAL | Status: DC | PRN

## 2021-04-20 MED ORDER — LIDOCAINE 2% (20 MG/ML) 5 ML SYRINGE
INTRAMUSCULAR | Status: AC
  Filled 2021-04-20: qty 5

## 2021-04-20 MED ORDER — FENTANYL CITRATE (PF) 100 MCG/2ML IJ SOLN
25.0000 ug | INTRAMUSCULAR | Status: DC | PRN
  Administered 2021-04-20 (×3): 50 ug via INTRAVENOUS

## 2021-04-20 MED ORDER — ACETAMINOPHEN 325 MG PO TABS: 325.0000 mg | ORAL_TABLET | ORAL | Status: DC | PRN

## 2021-04-20 MED ORDER — 0.9 % SODIUM CHLORIDE (POUR BTL) OPTIME
TOPICAL | Status: DC | PRN
  Administered 2021-04-20: 1000 mL

## 2021-04-20 MED ORDER — PROPOFOL 10 MG/ML IV BOLUS
INTRAVENOUS | Status: AC
  Filled 2021-04-20: qty 20

## 2021-04-20 MED ORDER — SODIUM CHLORIDE 0.9 % IV SOLN: 250.0000 mL | INTRAVENOUS | Status: DC | PRN

## 2021-04-20 MED ORDER — DEXAMETHASONE SODIUM PHOSPHATE 10 MG/ML IJ SOLN
INTRAMUSCULAR | Status: DC | PRN
  Administered 2021-04-20: 10 mg via INTRAVENOUS

## 2021-04-20 MED ORDER — PROMETHAZINE HCL 25 MG/ML IJ SOLN
6.2500 mg | INTRAMUSCULAR | Status: DC | PRN
Start: 2021-04-20 — End: 2021-04-20

## 2021-04-20 MED ORDER — OXYCODONE HCL 5 MG PO TABS
5.0000 mg | ORAL_TABLET | ORAL | Status: DC | PRN
  Administered 2021-04-20 – 2021-04-21 (×4): 10 mg via ORAL
  Filled 2021-04-20 (×4): qty 2

## 2021-04-20 MED ORDER — LIP MEDEX EX OINT
1.0000 "application " | TOPICAL_OINTMENT | Freq: Two times a day (BID) | CUTANEOUS | Status: DC
  Administered 2021-04-20 – 2021-04-22 (×5): 1 via TOPICAL
  Filled 2021-04-20 (×2): qty 7

## 2021-04-20 MED ORDER — FENTANYL CITRATE (PF) 100 MCG/2ML IJ SOLN: 50.0000 ug | INTRAMUSCULAR | Status: DC | PRN

## 2021-04-20 SURGICAL SUPPLY — 30 items
APPLIER CLIP 5 13 M/L LIGAMAX5 (MISCELLANEOUS) ×2
CABLE HIGH FREQUENCY MONO STRZ (ELECTRODE) ×2 IMPLANT
CHLORAPREP W/TINT 26 (MISCELLANEOUS) ×2 IMPLANT
CLIP APPLIE 5 13 M/L LIGAMAX5 (MISCELLANEOUS) ×1 IMPLANT
COVER MAYO STAND STRL (DRAPES) IMPLANT
COVER WAND RF STERILE (DRAPES) IMPLANT
DECANTER SPIKE VIAL GLASS SM (MISCELLANEOUS) ×2 IMPLANT
DERMABOND ADVANCED (GAUZE/BANDAGES/DRESSINGS) ×1
DERMABOND ADVANCED .7 DNX12 (GAUZE/BANDAGES/DRESSINGS) ×1 IMPLANT
DRAPE C-ARM 42X120 X-RAY (DRAPES) ×2 IMPLANT
ELECT REM PT RETURN 15FT ADLT (MISCELLANEOUS) ×2 IMPLANT
GLOVE SURG ENC MOIS LTX SZ7.5 (GLOVE) ×2 IMPLANT
GOWN STRL REUS W/TWL LRG LVL3 (GOWN DISPOSABLE) ×2 IMPLANT
GOWN STRL REUS W/TWL XL LVL3 (GOWN DISPOSABLE) ×4 IMPLANT
HEMOSTAT SURGICEL 4X8 (HEMOSTASIS) IMPLANT
KIT BASIN OR (CUSTOM PROCEDURE TRAY) ×2 IMPLANT
KIT TURNOVER KIT A (KITS) ×2 IMPLANT
PENCIL SMOKE EVACUATOR (MISCELLANEOUS) IMPLANT
POUCH SPECIMEN RETRIEVAL 10MM (ENDOMECHANICALS) ×2 IMPLANT
SCISSORS LAP 5X35 DISP (ENDOMECHANICALS) ×2 IMPLANT
SET CHOLANGIOGRAPH MIX (MISCELLANEOUS) ×2 IMPLANT
SET IRRIG TUBING LAPAROSCOPIC (IRRIGATION / IRRIGATOR) ×2 IMPLANT
SET TUBE SMOKE EVAC HIGH FLOW (TUBING) ×2 IMPLANT
SLEEVE XCEL OPT CAN 5 100 (ENDOMECHANICALS) ×4 IMPLANT
SUT MNCRL AB 4-0 PS2 18 (SUTURE) ×2 IMPLANT
TOWEL OR 17X26 10 PK STRL BLUE (TOWEL DISPOSABLE) ×2 IMPLANT
TOWEL OR NON WOVEN STRL DISP B (DISPOSABLE) ×2 IMPLANT
TRAY LAPAROSCOPIC (CUSTOM PROCEDURE TRAY) ×2 IMPLANT
TROCAR BLADELESS OPT 5 100 (ENDOMECHANICALS) ×2 IMPLANT
TROCAR XCEL BLUNT TIP 100MML (ENDOMECHANICALS) ×2 IMPLANT

## 2021-04-20 NOTE — Anesthesia Postprocedure Evaluation (Signed)
Anesthesia Post Note  Patient: Holly Roberts  Procedure(s) Performed: LAPAROSCOPIC CHOLECYSTECTOMY WITH INTRAOPERATIVE CHOLANGIOGRAM (N/A )     Patient location during evaluation: PACU Anesthesia Type: General Level of consciousness: awake and alert Pain management: pain level controlled Vital Signs Assessment: post-procedure vital signs reviewed and stable Respiratory status: spontaneous breathing, nonlabored ventilation, respiratory function stable and patient connected to nasal cannula oxygen Cardiovascular status: blood pressure returned to baseline and stable Postop Assessment: no apparent nausea or vomiting Anesthetic complications: no   No complications documented.  Last Vitals:  Vitals:   04/20/21 1455 04/20/21 1509  BP: (!) 146/89 (!) 150/86  Pulse: 97 96  Resp: 13 16  Temp: 37 C 36.8 C  SpO2: 97% 93%    Last Pain:  Vitals:   04/20/21 1509  TempSrc: Oral  PainSc:                  Shelton Silvas

## 2021-04-20 NOTE — Anesthesia Procedure Notes (Signed)
Procedure Name: Intubation Date/Time: 04/20/2021 1:17 PM Performed by: Rogers Blocker, CRNA Pre-anesthesia Checklist: Patient identified, Emergency Drugs available, Suction available and Patient being monitored Patient Re-evaluated:Patient Re-evaluated prior to induction Oxygen Delivery Method: Circle System Utilized Preoxygenation: Pre-oxygenation with 100% oxygen Induction Type: IV induction and Rapid sequence Laryngoscope Size: Mac and 3 Grade View: Grade I Tube type: Oral Number of attempts: 1 Airway Equipment and Method: Stylet and Oral airway Placement Confirmation: ETT inserted through vocal cords under direct vision,  positive ETCO2 and breath sounds checked- equal and bilateral Tube secured with: Tape Dental Injury: Teeth and Oropharynx as per pre-operative assessment

## 2021-04-20 NOTE — Transfer of Care (Signed)
Immediate Anesthesia Transfer of Care Note  Patient: Holly Roberts  Procedure(s) Performed: LAPAROSCOPIC CHOLECYSTECTOMY WITH INTRAOPERATIVE CHOLANGIOGRAM (N/A )  Patient Location: PACU  Anesthesia Type:General  Level of Consciousness: awake, alert , oriented and patient cooperative  Airway & Oxygen Therapy: Patient Spontanous Breathing and Patient connected to face mask oxygen  Post-op Assessment: Report given to RN and Post -op Vital signs reviewed and stable  Post vital signs: Reviewed and stable  Last Vitals:  Vitals Value Taken Time  BP 136/80 04/20/21 1327  Temp    Pulse 89 04/20/21 1330  Resp 14 04/20/21 1330  SpO2 100 % 04/20/21 1330  Vitals shown include unvalidated device data.  Last Pain:  Vitals:   04/20/21 1111  TempSrc:   PainSc: 7       Patients Stated Pain Goal: 4 (04/20/21 1111)  Complications: No complications documented.

## 2021-04-20 NOTE — Anesthesia Preprocedure Evaluation (Addendum)
Anesthesia Evaluation  Patient identified by MRN, date of birth, ID band Patient awake    Reviewed: Allergy & Precautions, NPO status , Patient's Chart, lab work & pertinent test results  Airway Mallampati: II  TM Distance: >3 FB Neck ROM: Full    Dental  (+) Caps, Dental Advisory Given, Teeth Intact   Pulmonary neg pulmonary ROS,    breath sounds clear to auscultation       Cardiovascular hypertension,  Rhythm:Regular Rate:Normal     Neuro/Psych negative neurological ROS  negative psych ROS   GI/Hepatic negative GI ROS, Neg liver ROS,   Endo/Other  negative endocrine ROS  Renal/GU negative Renal ROS     Musculoskeletal negative musculoskeletal ROS (+)   Abdominal Normal abdominal exam  (+)   Peds  Hematology negative hematology ROS (+)   Anesthesia Other Findings   Reproductive/Obstetrics                            Anesthesia Physical Anesthesia Plan  ASA: III  Anesthesia Plan: General   Post-op Pain Management:    Induction: Intravenous  PONV Risk Score and Plan: 4 or greater and Ondansetron, Dexamethasone, Midazolam and Scopolamine patch - Pre-op  Airway Management Planned: Oral ETT  Additional Equipment: None  Intra-op Plan:   Post-operative Plan: Extubation in OR  Informed Consent:   Plan Discussed with: CRNA  Anesthesia Plan Comments:         Anesthesia Quick Evaluation

## 2021-04-20 NOTE — Op Note (Signed)
LAPAROSCOPIC CHOLECYSTECTOMY WITH INTRAOPERATIVE CHOLANGIOGRAM  Procedure Note  Holly Roberts 04/19/2021 - 04/20/2021   Pre-op Diagnosis: CHOLECYSTITIS WITH CHOLELITHIASIS     Post-op Diagnosis: SAME  Procedure(s): LAPAROSCOPIC CHOLECYSTECTOMY WITH INTRAOPERATIVE CHOLANGIOGRAM  Surgeon(s): [name], MD  Anesthesia: General  Staff:  Physician Assistant: Jacinto Halim, PA-C Radiology Technologist: Justice Britain Relief Circulator: Bolivar Haw, RN Relief Scrub: Leeanne Rio Person: Liam Graham, RN  Estimated Blood Loss: Minimal               Specimens: sent to path  Findings: Patient was found to have a severely acutely inflamed gallbladder.  There was a stone impacted in the gallbladder neck.  Cholangiogram showed a very dilated common bile duct but contrast did flow into the duodenum.  An obvious obstructing stone was not visualized.  Radiology report is pending  Procedure: The patient was brought to operating identifies correct patient.  She was placed supine on the operating table general anesthesia was induced.  Her abdomen was then prepped and draped in usual sterile fashion.  I made a small vertical incision below the umbilicus with a scalpel.  I then carried this down to the fascia which was then opened with a scalpel.  Hemostat was then used to pass to the peritoneal cavity under direct vision.  A 0 Vicryl pursing suture was placed around the fascial opening.  The [facility] port was placed the opening and insufflation of the abdomen was begun.  I next placed a 5 mm trocar in the patient's epigastrium and 2 more 5 mm trochars in the right upper quadrant all under direct vision.  The gallbladder was covered with omentum which I was able to pull off of it bluntly.  The gallbladder itself was severely acutely inflamed.  It was very tense so had to aspirate bile from the gallbladder which was completely clear consistent with cystic duct  obstruction.  There was a large stone at the base the gallbladder.  We were then able to grasp the gallbladder and retracted above the liver bed.  There was a small bridging vein on top of the cystic duct which I clipped surgical clips and transected.  I then dissected out the cystic duct and achieved a critical window around it.  The cystic artery was identified and clipped proximally distally as well.  I then made a small opening in the cystic duct with a laparoscopic scissors.  I placed a cholangiocatheter through a small opening in the right upper quadrant under direct vision.  The cholangiocatheter was then placed into the opening the cystic duct.  The cholangiogram was then performed with fluoroscopy.  Contrast was seen to flow to the biliary system.  The common bile duct was dilated but then tapered and contrast did flow into the duodenum.  I did not see an obvious stone but the radiologist report is pending.  At this point the cholangiocatheter was removed.  The cystic duct was clipped 3 times proximally and transected.  I then transected the cystic artery.  The gallbladder was then slowly dissected free from the liver bed with electrocautery.  Once it was free from liver bed, I achieved hemostasis with the cautery.  The gallbladder was then placed in an Endosac and removed the incision at the umbilicus.  The 0 Vicryl at the umbilicus was tied in place closing the fascial defect.  I then copiously irrigated the right upper quadrant with normal saline.  Again hemostasis appeared to be  achieved.  All ports were then removed under direct vision the abdomen was deflated.  All incisions were then anesthetized with Marcaine and closed with 4-0 Monocryl sutures.  Dermabond was then applied.  The patient tolerated the procedure well.  All the counts were correct at the end of the procedure.  The patient was then extubated in the operating room and taken in a stable condition to the recovery room.          [name]   Date: 04/20/2021  Time: 1:08 PM

## 2021-04-20 NOTE — Progress Notes (Signed)
Patient ID: Holly Roberts, female   DOB: 1966-12-07, 55 y.o.   MRN: [id number]   Pre Procedure note for inpatients:   Holly Roberts has been scheduled for Procedure(s): LAPAROSCOPIC CHOLECYSTECTOMY WITH INTRAOPERATIVE CHOLANGIOGRAM (N/A) today. The various methods of treatment have been discussed with the patient. After consideration of the risks, benefits and treatment options the patient has consented to the planned procedure.   The patient has been seen and labs reviewed. There are no changes in the patient's condition to prevent proceeding with the planned procedure today.  Recent labs:  Lab Results  Component Value Date   WBC 8.2 04/20/2021   HGB 10.5 (L) 04/20/2021   HCT 33.4 (L) 04/20/2021   PLT 153 04/20/2021   GLUCOSE 97 04/20/2021   ALT 40 04/20/2021   AST 45 (H) 04/20/2021   NA 142 04/20/2021   K 4.2 04/20/2021   CL 107 04/20/2021   CREATININE 0.84 04/20/2021   BUN 10 04/20/2021   CO2 25 04/20/2021    [name], MD 04/20/2021 10:46 AM

## 2021-04-21 ENCOUNTER — Encounter (HOSPITAL_COMMUNITY): Payer: Self-pay | Admitting: Surgery

## 2021-04-21 LAB — COMPREHENSIVE METABOLIC PANEL
ALT: 51 U/L — ABNORMAL HIGH (ref 0–44)
AST: 52 U/L — ABNORMAL HIGH (ref 15–41)
Albumin: 3.1 g/dL — ABNORMAL LOW (ref 3.5–5.0)
Alkaline Phosphatase: 220 U/L — ABNORMAL HIGH (ref 38–126)
Anion gap: 11 (ref 5–15)
BUN: 8 mg/dL (ref 6–20)
CO2: 27 mmol/L (ref 22–32)
Calcium: 9 mg/dL (ref 8.9–10.3)
Chloride: 100 mmol/L (ref 98–111)
Creatinine, Ser: 0.82 mg/dL (ref 0.44–1.00)
GFR, Estimated: >60 mL/min (ref >60)
Glucose, Bld: 138 mg/dL — ABNORMAL HIGH (ref 70–99)
Potassium: 4.4 mmol/L (ref 3.5–5.1)
Sodium: 138 mmol/L (ref 135–145)
Total Bilirubin: 1.8 mg/dL — ABNORMAL HIGH (ref 0.3–1.2)
Total Protein: 6.6 g/dL (ref 6.5–8.1)

## 2021-04-21 LAB — SURGICAL PATHOLOGY

## 2021-04-21 LAB — CBC
HCT: 34.7 % — ABNORMAL LOW (ref 36.0–46.0)
Hemoglobin: 11.2 g/dL — ABNORMAL LOW (ref 12.0–15.0)
MCH: 28.4 pg (ref 26.0–34.0)
MCHC: 32.3 g/dL (ref 30.0–36.0)
MCV: 88.1 fL (ref 80.0–100.0)
Platelets: 194 10*3/uL (ref 150–400)
RBC: 3.94 MIL/uL (ref 3.87–5.11)
RDW: 11.7 % (ref 11.5–15.5)
WBC: 9.9 10*3/uL (ref 4.0–10.5)
nRBC: 0 % (ref 0.0–0.2)

## 2021-04-21 MED ORDER — TAPENTADOL HCL ER 100 MG PO TB12
200.0000 mg | ORAL_TABLET | Freq: Two times a day (BID) | ORAL | Status: DC
  Administered 2021-04-21 – 2021-04-22 (×3): 200 mg via ORAL
  Filled 2021-04-21 (×5): qty 2

## 2021-04-21 MED ORDER — TAPENTADOL HCL 75 MG PO TABS
200.0000 mg | ORAL_TABLET | Freq: Every day | ORAL | Status: DC
  Filled 2021-04-21: qty 1

## 2021-04-21 MED ORDER — TAPENTADOL HCL 50 MG PO TABS
200.0000 mg | ORAL_TABLET | Freq: Every day | ORAL | Status: DC
  Filled 2021-04-21: qty 4

## 2021-04-21 MED ORDER — TAPENTADOL HCL 50 MG PO TABS
200.0000 mg | ORAL_TABLET | Freq: Every day | ORAL | Status: DC
  Administered 2021-04-21 – 2021-04-22 (×2): 200 mg via ORAL
  Filled 2021-04-21 (×4): qty 4

## 2021-04-21 NOTE — Progress Notes (Signed)
Spoke with pharmacy. Patient takes Nucynta at home for pain. Verified in database. They will re-prescribe here to assist with pain control. D/c current PRN narcotic analgesics in lieu of patient going back on home meds.

## 2021-04-21 NOTE — Progress Notes (Signed)
1 Day Post-Op   Subjective/Chief Complaint: Feeling better but still with moderate pain and anxiety   Objective: Vital signs in last 24 hours: Temp:  [98 F (36.7 C)-98.8 F (37.1 C)] 98.7 F (37.1 C) (05/06 0525) Pulse Rate:  [89-110] 101 (05/06 0525) Resp:  [11-19] 16 (05/06 0525) BP: (136-168)/(75-118) 139/90 (05/06 0525) SpO2:  [89 %-100 %] 98 % (05/06 0525) Weight:  [113.4 kg] 113.4 kg (05/05 1111) Last BM Date: 04/18/21  Intake/Output from previous day: 05/05 0701 - 05/06 0700 In: 2777.2 [P.O.:120; I.V.:2543.2; IV Piggyback:114] Out: 455 [Urine:450; Blood:5] Intake/Output this shift: Total I/O In: -  Out: 550 [Urine:550]  Exam: Looks a little anxious No sob or resp distress Abdomen soft, incisions clean  Lab Results:  Recent Labs    04/20/21 0408 04/21/21 0341  WBC 8.2 9.9  HGB 10.5* 11.2*  HCT 33.4* 34.7*  PLT 153 194   BMET Recent Labs    04/20/21 0408 04/21/21 0341  NA 142 138  K 4.2 4.4  CL 107 100  CO2 25 27  GLUCOSE 97 138*  BUN 10 8  CREATININE 0.84 0.82  CALCIUM 8.6* 9.0   PT/INR No results for input(s): LABPROT, INR in the last 72 hours. ABG No results for input(s): PHART, HCO3 in the last 72 hours.  Invalid input(s): PCO2, PO2  Studies/Results: DG Cholangiogram Operative  Result Date: 04/20/2021 CLINICAL DATA:  55 year old female with history of cholecystitis. EXAM: INTRAOPERATIVE CHOLANGIOGRAM TECHNIQUE: Cholangiographic images from the C-arm fluoroscopic device were submitted for interpretation post-operatively. Please see the procedural report for the amount of contrast and the fluoroscopy time utilized. COMPARISON:  04/19/2021 FINDINGS: Intraoperative antegrade injection via the cystic duct which opacifies the common bile duct and central portions of the intrahepatic biliary tree. Contrast is visualized flowing freely into the duodenum. There are no filling defects. Mild diffuse intra and extrahepatic biliary ductal dilation. No  apparent anomalous anatomical configuration of the biliary tree. IMPRESSION: No evidence of choledocholithiasis. Mild intra and extrahepatic biliary duct dilation. Marliss Coots, MD [facility] Electronically Signed   By: Marliss Coots MD   On: 04/20/2021 13:10   CT Angio Chest PE W/Cm &/Or Wo Cm  Result Date: 04/19/2021 CLINICAL DATA:  PE suspected right lower chest pain, fever EXAM: CT ANGIOGRAPHY CHEST WITH CONTRAST TECHNIQUE: Multidetector CT imaging of the chest was performed using the standard protocol during bolus administration of intravenous contrast. Multiplanar CT image reconstructions and MIPs were obtained to evaluate the vascular anatomy. CONTRAST:  OMNIPAQUE IOHEXOL 350 MG/ML SOLN COMPARISON:  None. FINDINGS: Cardiovascular: Examination for pulmonary embolism is limited by body habitus and breath motion artifact. Within this limitation, no evidence of pulmonary embolism through the proximal segmental pulmonary arterial level. Cardiomegaly. Scattered left coronary artery calcifications. No pericardial effusion. Mediastinum/Nodes: No enlarged mediastinal, hilar, or axillary lymph nodes. Thyroid gland, trachea, and esophagus demonstrate no significant findings. Lungs/Pleura: Trace bilateral pleural effusions and associated atelectasis or consolidation. Upper Abdomen: Please see separately reported examination of the abdomen and pelvis. Musculoskeletal: No chest wall abnormality. No acute or significant osseous findings. Review of the MIP images confirms the above findings. IMPRESSION: 1. Examination for pulmonary embolism is limited by body habitus and breath motion artifact. Within this limitation, no evidence of pulmonary embolism through the proximal segmental pulmonary arterial level. 2. Trace bilateral pleural effusions and associated atelectasis or consolidation. 3. Cardiomegaly and coronary artery disease. Electronically Signed    By: Erasmo Score.D.  On: 04/19/2021 13:59   CT Abdomen Pelvis W Contrast  Result Date: 04/19/2021 CLINICAL DATA:  Abdominal pain and fever EXAM: CT ABDOMEN AND PELVIS WITH CONTRAST TECHNIQUE: Multidetector CT imaging of the abdomen and pelvis was performed using the standard protocol following bolus administration of intravenous contrast. CONTRAST:  OMNIPAQUE IOHEXOL 350 MG/ML SOLN COMPARISON:  None. FINDINGS: Lower chest: No acute abnormality. Hepatobiliary: No solid liver abnormality is seen. Gallbladder wall thickening and trace pericholecystic fluid. Faintly rim calcified gallstone measuring 1.4 cm near the gallbladder neck (series 2, image 25). Intra and extrahepatic biliary ductal dilatation, the central common bile duct measuring up to 1.1 cm in caliber. No obstructing calculus identified to the ampulla (series 4, image 71). Pancreas: Unremarkable. No pancreatic ductal dilatation or surrounding inflammatory changes. Spleen: Normal in size without significant abnormality. Adrenals/Urinary Tract: Adrenal glands are unremarkable. Kidneys are normal, without renal calculi, solid lesion, or hydronephrosis. Bladder is unremarkable. Stomach/Bowel: Stomach is within normal limits. Appendix appears normal. No evidence of bowel wall thickening, distention, or inflammatory changes. Vascular/Lymphatic: No significant vascular findings are present. No enlarged abdominal or pelvic lymph nodes. Reproductive: No mass or other significant abnormality. Other: No abdominal wall hernia or abnormality. Trace fluid in the low pelvis. Musculoskeletal: No acute or significant osseous findings. IMPRESSION: 1. Gallbladder wall thickening and trace pericholecystic fluid. Faintly rim calcified gallstone measuring 1.4 cm near the gallbladder neck. Findings are concerning for acute cholecystitis. 2. Intra- and extrahepatic biliary ductal dilatation, the central common bile duct measuring up to 1.1 cm in caliber. No obstructing  calculus identified to the ampulla. A recently passed calculus or a noncalcified calculus could produce this appearance. 3. Trace fluid in the low pelvis, likely reactive. Electronically Signed   By: Lauralyn Primes M.D.   On: 04/19/2021 13:56   DG Chest Portable 1 View  Result Date: 04/19/2021 CLINICAL DATA:  RIGHT lower chest pain and hypoxia, RIGHT mid axillary pain since last night worse with inspiration EXAM: PORTABLE CHEST 1 VIEW COMPARISON:  Portable exam 1003 hours compared to 04/17/2021 FINDINGS: Upper normal heart size. Mediastinal contours and pulmonary vascularity normal. Decreased lung volumes with bibasilar atelectasis. Remaining lungs clear. No acute infiltrate, pleural effusion or pneumothorax. LEFT glenohumeral degenerative changes. IMPRESSION: Decreased lung volumes with bibasilar atelectasis. Electronically Signed   By: Ulyses Southward M.D.   On: 04/19/2021 10:25    Anti-infectives: Anti-infectives (From admission, onward)   Start     Dose/Rate Route Frequency Ordered Stop   04/20/21 1800  valACYclovir (VALTREX) tablet 1,000 mg        1,000 mg Oral 3 times daily 04/20/21 1709 04/27/21 1559   04/19/21 2100  piperacillin-tazobactam (ZOSYN) IVPB 3.375 g        3.375 g 12.5 mL/hr over 240 Minutes Intravenous Every 8 hours 04/19/21 1657     04/19/21 1430  piperacillin-tazobactam (ZOSYN) IVPB 3.375 g        3.375 g 100 mL/hr over 30 Minutes Intravenous  Once 04/19/21 1426 04/19/21 1517      Assessment/Plan: s/p Procedure(s): LAPAROSCOPIC CHOLECYSTECTOMY WITH INTRAOPERATIVE CHOLANGIOGRAM (N/A)  POD#1 S/p lap chole with IOC for significant cholecystitis  LFT's trending down Hgb stable Needs to ambulate Continue pain control Doubt she will be able to go home today  LOS: 1 day    Holly Roberts 04/21/2021

## 2021-04-22 LAB — COMPREHENSIVE METABOLIC PANEL
ALT: 40 U/L (ref 0–44)
AST: 30 U/L (ref 15–41)
Albumin: 3.6 g/dL (ref 3.5–5.0)
Alkaline Phosphatase: 191 U/L — ABNORMAL HIGH (ref 38–126)
Anion gap: 12 (ref 5–15)
BUN: 6 mg/dL (ref 6–20)
CO2: 27 mmol/L (ref 22–32)
Calcium: 9.2 mg/dL (ref 8.9–10.3)
Chloride: 101 mmol/L (ref 98–111)
Creatinine, Ser: 0.97 mg/dL (ref 0.44–1.00)
GFR, Estimated: >60 mL/min (ref >60)
Glucose, Bld: 94 mg/dL (ref 70–99)
Potassium: 4.3 mmol/L (ref 3.5–5.1)
Sodium: 140 mmol/L (ref 135–145)
Total Bilirubin: 0.9 mg/dL (ref 0.3–1.2)
Total Protein: 7 g/dL (ref 6.5–8.1)

## 2021-04-22 LAB — CBC
HCT: 34.6 % — ABNORMAL LOW (ref 36.0–46.0)
Hemoglobin: 11 g/dL — ABNORMAL LOW (ref 12.0–15.0)
MCH: 27.6 pg (ref 26.0–34.0)
MCHC: 31.8 g/dL (ref 30.0–36.0)
MCV: 86.9 fL (ref 80.0–100.0)
Platelets: 219 10*3/uL (ref 150–400)
RBC: 3.98 MIL/uL (ref 3.87–5.11)
RDW: 12.1 % (ref 11.5–15.5)
WBC: 7.2 10*3/uL (ref 4.0–10.5)
nRBC: 0 % (ref 0.0–0.2)

## 2021-04-22 LAB — C DIFFICILE (CDIFF) QUICK SCRN (NO PCR REFLEX)
C Diff antigen: NEGATIVE
C Diff interpretation: NEGATIVE
C Diff toxin: NEGATIVE

## 2021-04-22 MED ORDER — CHOLESTYRAMINE LIGHT 4 G PO PACK
4.0000 g | PACK | Freq: Four times a day (QID) | ORAL | Status: DC
  Administered 2021-04-22 (×2): 4 g via ORAL
  Filled 2021-04-22 (×6): qty 1

## 2021-04-22 NOTE — Progress Notes (Signed)
2 Days Post-Op   Subjective/Chief Complaint: Patient is with multiple complaints.  Has diarrhea.  Also has a small area on the right buttock she wants me to check today.  She has had over 7 bowel movements.  She spends most of her time on the commode.  No nausea or vomiting.  No significant abdominal pain.   Objective: Vital signs in last 24 hours: Temp:  [98.4 F (36.9 C)-99 F (37.2 C)] 99 F (37.2 C) (05/07 0553) Pulse Rate:  [89-104] 97 (05/07 0553) Resp:  [17-18] 18 (05/07 0553) BP: (143-165)/(86-93) 165/86 (05/07 0553) SpO2:  [92 %-96 %] 96 % (05/07 0553) Last BM Date: 04/21/21  Intake/Output from previous day: 05/06 0701 - 05/07 0700 In: 2340.1 [P.O.:1200; I.V.:1068.6; IV Piggyback:71.4] Out: 1500 [Urine:1500] Intake/Output this shift: No intake/output data recorded.  General appearance: alert and cooperative Resp: clear to auscultation bilaterally Skin: Skin color, texture, turgor normal. No rashes or lesions or Right buttock shows a 1 cm furuncle.  This appears decompressed.  No fluctuance.  No significant surrounding cellulitis at this point in time. Incision/Wound: Abdominal port sites clean dry and intact.  No signs of peritonitis  Lab Results:  Recent Labs    04/21/21 0341 04/22/21 0523  WBC 9.9 7.2  HGB 11.2* 11.0*  HCT 34.7* 34.6*  PLT 194 219   BMET Recent Labs    04/21/21 0341 04/22/21 0523  NA 138 140  K 4.4 4.3  CL 100 101  CO2 27 27  GLUCOSE 138* 94  BUN 8 6  CREATININE 0.82 0.97  CALCIUM 9.0 9.2   PT/INR No results for input(s): LABPROT, INR in the last 72 hours. ABG No results for input(s): PHART, HCO3 in the last 72 hours.  Invalid input(s): PCO2, PO2  Studies/Results: DG Cholangiogram Operative  Result Date: 04/20/2021 CLINICAL DATA:  55 year old female with history of cholecystitis. EXAM: INTRAOPERATIVE CHOLANGIOGRAM TECHNIQUE: Cholangiographic images from the C-arm fluoroscopic device were submitted for interpretation  post-operatively. Please see the procedural report for the amount of contrast and the fluoroscopy time utilized. COMPARISON:  04/19/2021 FINDINGS: Intraoperative antegrade injection via the cystic duct which opacifies the common bile duct and central portions of the intrahepatic biliary tree. Contrast is visualized flowing freely into the duodenum. There are no filling defects. Mild diffuse intra and extrahepatic biliary ductal dilation. No apparent anomalous anatomical configuration of the biliary tree. IMPRESSION: No evidence of choledocholithiasis. Mild intra and extrahepatic biliary duct dilation. Marliss Coots, MD [facility] Electronically Signed   By: Marliss Coots MD   On: 04/20/2021 13:10    Anti-infectives: Anti-infectives (From admission, onward)   Start     Dose/Rate Route Frequency Ordered Stop   04/20/21 1800  valACYclovir (VALTREX) tablet 1,000 mg        1,000 mg Oral 3 times daily 04/20/21 1709 04/27/21 1559   04/19/21 2100  piperacillin-tazobactam (ZOSYN) IVPB 3.375 g        3.375 g 12.5 mL/hr over 240 Minutes Intravenous Every 8 hours 04/19/21 1657     04/19/21 1430  piperacillin-tazobactam (ZOSYN) IVPB 3.375 g        3.375 g 100 mL/hr over 30 Minutes Intravenous  Once 04/19/21 1426 04/19/21 1517      Assessment/Plan: s/p Procedure(s): LAPAROSCOPIC CHOLECYSTECTOMY WITH INTRAOPERATIVE CHOLANGIOGRAM (N/A) Given multiple bowel movements and history of antibiotics, recommend C. difficile testing  Add cholestyramine 4 g p.o. 3 times daily to 4 times daily for diarrhea which could be postcholecystectomy related  Discharge at this point time is pending depending on C. difficile testing.  If this is negative, she can go home later today on cholestyramine otherwise plan to discharge tomorrow depending on above work-up.  LOS: 2 days    Clovis Pu Dennison Mcdaid 04/22/2021

## 2021-04-23 MED ORDER — ONDANSETRON 4 MG PO TBDP: 4.0000 mg | ORAL_TABLET | Freq: Four times a day (QID) | ORAL | 0 refills | Status: DC | PRN

## 2021-04-23 MED ORDER — TRAMADOL HCL 50 MG PO TABS: 50.0000 mg | ORAL_TABLET | Freq: Four times a day (QID) | ORAL | 0 refills | Status: DC | PRN

## 2021-04-23 MED ORDER — DOXYCYCLINE HYCLATE 50 MG PO CAPS: 50.0000 mg | ORAL_CAPSULE | Freq: Two times a day (BID) | ORAL | 0 refills | Status: DC

## 2021-04-23 MED ORDER — CHOLESTYRAMINE LIGHT 4 G PO PACK: 4.0000 g | PACK | Freq: Three times a day (TID) | ORAL | 1 refills | Status: DC

## 2021-04-23 MED ORDER — IBUPROFEN 800 MG PO TABS: 800.0000 mg | ORAL_TABLET | Freq: Three times a day (TID) | ORAL | 0 refills | Status: DC | PRN

## 2021-04-23 NOTE — Discharge Instructions (Signed)
[facility]  Please arrive at least 30 min before your appointment to complete your check in paperwork.  If you are unable to arrive 30 min prior to your appointment time we may have to cancel or reschedule you. LAPAROSCOPIC SURGERY: POST OP INSTRUCTIONS Always review your discharge instruction sheet given to you by the facility where your surgery was performed. IF YOU HAVE DISABILITY OR FAMILY LEAVE FORMS, YOU MUST BRING THEM TO THE OFFICE FOR PROCESSING.   DO NOT GIVE THEM TO YOUR DOCTOR.  PAIN CONTROL  1. First take acetaminophen (Tylenol) AND/or ibuprofen (Advil) to control your pain after surgery.  Follow directions on package.  Taking acetaminophen (Tylenol) and/or ibuprofen (Advil) regularly after surgery will help to control your pain and lower the amount of prescription pain medication you may need.  You should not take more than 4,000 mg (4 grams) of acetaminophen (Tylenol) in 24 hours.  You should not take ibuprofen (Advil), aleve, motrin, naprosyn or other NSAIDS if you have a history of stomach ulcers or chronic kidney disease.  2. A prescription for pain medication may be given to you upon discharge.  Take your pain medication as prescribed, if you still have uncontrolled pain after taking acetaminophen (Tylenol) or ibuprofen (Advil). 3. Use ice packs to help control pain. 4. If you need a refill on your pain medication, please contact your pharmacy.  They will contact our office to request authorization. Prescriptions will not be filled after 5pm or on week-ends.  HOME MEDICATIONS 5. Take your usually prescribed medications unless otherwise directed.  DIET 6. You should follow a light diet the first few days after arrival home.  Be sure to include lots of fluids daily. Avoid fatty, fried foods.   CONSTIPATION 7. It is common to experience some constipation after surgery and if you are taking pain medication.  Increasing fluid intake and taking a stool  softener (such as Colace) will usually help or prevent this problem from occurring.  A mild laxative (Milk of Magnesia or Miralax) should be taken according to package instructions if there are no bowel movements after 48 hours.  WOUND/INCISION CARE 8. Most patients will experience some swelling and bruising in the area of the incisions.  Ice packs will help.  Swelling and bruising can take several days to resolve.  9. Unless discharge instructions indicate otherwise, follow guidelines below  a. STERI-STRIPS - you may remove your outer bandages 48 hours after surgery, and you may shower at that time.  You have steri-strips (small skin tapes) in place directly over the incision.  These strips should be left on the skin for 7-10 days.   b. DERMABOND/SKIN GLUE - you may shower in 24 hours.  The glue will flake off over the next 2-3 weeks. 10. Any sutures or staples will be removed at the office during your follow-up visit.  ACTIVITIES 11. You may resume regular (light) daily activities beginning the next day--such as daily self-care, walking, climbing stairs--gradually increasing activities as tolerated.  You may have sexual intercourse when it is comfortable.  Refrain from any heavy lifting or straining until approved by your doctor. a. You may drive when you are no longer taking prescription pain medication, you can comfortably wear a seatbelt, and you can safely maneuver your car and apply brakes.  FOLLOW-UP 12. You should see your doctor in the office for a follow-up appointment approximately 2-3 weeks after your surgery.  You should have been given your post-op/follow-up appointment when   your surgery was scheduled.  If you did not receive a post-op/follow-up appointment, make sure that you call for this appointment within a day or two after you arrive home to insure a convenient appointment time.   WHEN TO CALL YOUR DOCTOR: 1. Fever over 101.0 2. Inability to urinate 3. Continued bleeding from  incision. 4. Increased pain, redness, or drainage from the incision. 5. Increasing abdominal pain  The clinic staff is available to answer your questions during regular business hours.  Please don't hesitate to call and ask to speak to one of the nurses for clinical concerns.  If you have a medical emergency, go to the nearest emergency room or call 911.  A surgeon from [facility] is always on call at the hospital. [address], [place], [place], [place]  [address] ? [address], [place],    [address] [phone] ? [phone] ? FAX [phone]   Gallbladder Eating Plan If you have a gallbladder condition, you may have trouble digesting fats. Eating a low-fat diet can help reduce your symptoms, and may be helpful before and after having surgery to remove your gallbladder (cholecystectomy). Your health care provider may recommend that you work with a diet and nutrition specialist (dietitian) to help you reduce the amount of fat in your diet. What are tips for following this plan? General guidelines  Limit your fat intake to less than 30% of your total daily calories. If you eat around 1,800 calories each day, this is less than 60 grams (g) of fat per day.  Fat is an important part of a healthy diet. Eating a low-fat diet can make it hard to maintain a healthy body weight. Ask your dietitian how much fat, calories, and other nutrients you need each day.  Eat small, frequent meals throughout the day instead of three large meals.  Drink at least 8-10 cups of fluid a day. Drink enough fluid to keep your urine clear or pale yellow.  Limit alcohol intake to no more than 1 drink a day for nonpregnant women and 2 drinks a day for men. One drink equals 12 oz of beer, 5 oz of wine, or 1 oz of hard liquor. Reading food labels  Check Nutrition Facts on food labels for the amount of fat per serving. Choose foods with less than 3 grams of fat per serving.    Shopping  Choose nonfat and low-fat healthy foods. Look for the words "nonfat," "low fat," or "fat free."  Avoid buying processed or prepackaged foods. Cooking  Cook using low-fat methods, such as baking, broiling, grilling, or boiling.  Cook with small amounts of healthy fats, such as olive oil, grapeseed oil, canola oil, or sunflower oil. What foods are recommended?  All fresh, frozen, or canned fruits and vegetables.  Whole grains.  Low-fat or non-fat (skim) milk and yogurt.  Lean meat, skinless poultry, fish, eggs, and beans.  Low-fat protein supplement powders or drinks.  Spices and herbs. What foods are not recommended?  High-fat foods. These include baked goods, fast food, fatty cuts of meat, ice cream, french toast, sweet rolls, pizza, cheese bread, foods covered with butter, creamy sauces, or cheese.  Fried foods. These include french fries, tempura, battered fish, breaded chicken, fried breads, and sweets.  Foods with strong odors.  Foods that cause bloating and gas. Summary  A low-fat diet can be helpful if you have a gallbladder condition, or before and after gallbladder surgery.  Limit your fat intake to  less than 30% of your total daily calories. This is about 60 g of fat if you eat 1,800 calories each day.  Eat small, frequent meals throughout the day instead of three large meals. This information is not intended to replace advice given to you by your health care provider. Make sure you discuss any questions you have with your health care provider. Document Revised: 07/21/2020 Document Reviewed: 07/21/2020 Elsevier Patient Education  2021 [employer].   Return to work in 10 days from 04/23/2021 with no restriction

## 2021-04-23 NOTE — Discharge Summary (Signed)
Physician Discharge Summary  Patient ID: Holly Roberts MRN: [id number] DOB/AGE: 09-10-1966 55 y.o.  Admit date: 04/19/2021 Discharge date: 04/23/2021  Admission Diagnoses: Cholecystitis with stones  Discharge Diagnoses: Same Active Problems:   Cholecystitis with cholelithiasis   Discharged Condition: good  Hospital Course: Patient admitted on 04/20/2021 for acute cholecystitis.  She underwent laparoscopic cholecystectomy on 04/21/2021.  Her postop course was complicated by diarrhea.  C. difficile was tested and was negative.  Most of this was secondary to postcholecystectomy diarrhea.  Cholestyramine was started.  She had some improvement in her diarrhea by day 1.  She had a small furuncle on her right buttock.  That appeared to be resolving.  She was discharged home on doxycycline since she has an allergy to amoxicillin.  She was tolerating her diet, was able to ambulate.  She did have issues with anxiety and she will resume her home medications for that.    Significant Diagnostic Studies: labs:  CBC    Component Value Date/Time   WBC 7.2 04/22/2021 0523   RBC 3.98 04/22/2021 0523   HGB 11.0 (L) 04/22/2021 0523   HCT 34.6 (L) 04/22/2021 0523   PLT 219 04/22/2021 0523   MCV 86.9 04/22/2021 0523   MCH 27.6 04/22/2021 0523   MCHC 31.8 04/22/2021 0523   RDW 12.1 04/22/2021 0523   LYMPHSABS 0.6 (L) 04/19/2021 1034   MONOABS 0.6 04/19/2021 1034   EOSABS 0.0 04/19/2021 1034   BASOSABS 0.0 04/19/2021 1034   CMP Latest Ref Rng & Units 04/22/2021 04/21/2021 04/20/2021  Glucose 70 - 99 mg/dL 94 130(Q) 97  BUN 6 - 20 mg/dL 6 8 10   Creatinine 0.44 - 1.00 mg/dL 6.57 8.46 9.62  Sodium 135 - 145 mmol/L 140 138 142  Potassium 3.5 - 5.1 mmol/L 4.3 4.4 4.2  Chloride 98 - 111 mmol/L 101 100 107  CO2 22 - 32 mmol/L 27 27 25   Calcium 8.9 - 10.3 mg/dL 9.2 9.0 9.5(M)  Total Protein 6.5 - 8.1 g/dL 7.0 6.6 6.1(L)  Total Bilirubin 0.3 - 1.2 mg/dL 0.9 8.4(X) 2.1(H)  Alkaline Phos 38 - 126 U/L 191(H) 220(H)  174(H)  AST 15 - 41 U/L 30 52(H) 45(H)  ALT 0 - 44 U/L 40 51(H) 40    Treatments: surgery: Laparoscopic cholecystectomy  Discharge Exam: Blood pressure (!) 166/91, pulse 93, temperature 98.6 F (37 C), temperature source Oral, resp. rate 18, height 5\' 4"  (1.626 m), weight 113.4 kg, SpO2 100 %. General appearance: alert and cooperative Resp: No wheezing Cardio: Normal sinus rhythm Skin: Skin color, texture, turgor normal. No rashes or lesions or Right buttock shows a 2 cm furuncle that is decompressed.  No drainage.  Minimal tenderness. Incision/Wound: Port sites clean dry intact.  Abdomen benign  Disposition: Discharge disposition: 01-Home or Self Care       Discharge Instructions    Diet - low sodium heart healthy   Complete by: As directed    Increase activity slowly   Complete by: As directed      Allergies as of 04/23/2021      Reactions   Buprenorphine Rash   Patch gave rash- poss adhesive issues. Tried generic and brand name both. Patient states: "I am not allergic to the medication. I am allergic to the adhesive, not the medication."    Amoxicillin-pot Clavulanate    Other reaction(s): Other (See Comments) States "strong" antibiotics cause C diff   Sulfa Antibiotics       Medication List    TAKE these  medications   Armodafinil 250 MG tablet Take 250 mg by mouth every morning.   cholestyramine light 4 g packet Commonly known as: PREVALITE Take 1 packet (4 g total) by mouth 3 (three) times daily.   diclofenac 50 MG EC tablet Commonly known as: VOLTAREN Take 50 mg by mouth daily.   ibuprofen 800 MG tablet Commonly known as: ADVIL Take 1 tablet (800 mg total) by mouth every 8 (eight) hours as needed.   metoprolol tartrate 25 MG tablet Commonly known as: LOPRESSOR Take 1 tablet (25 mg total) by mouth daily.   Nucynta ER 200 MG Tb12 Generic drug: Tapentadol HCl Take 200 mg by mouth 2 (two) times daily.   Nucynta 100 MG Tabs Generic drug: Tapentadol  HCl Take 200 mg by mouth daily in the afternoon.   ondansetron 4 MG disintegrating tablet Commonly known as: ZOFRAN-ODT Take 1 tablet (4 mg total) by mouth every 6 (six) hours as needed for nausea.   traMADol 50 MG tablet Commonly known as: Ultram Take 1 tablet (50 mg total) by mouth every 6 (six) hours as needed.   Trintellix 10 MG Tabs tablet Generic drug: vortioxetine HBr Take 10 mg by mouth daily.   Vraylar 4.5 MG Caps Generic drug: Cariprazine HCl Take 4.5 mg by mouth daily.       Follow-up Information    [facility], [facility]. Go on 05/09/2021.   Specialty: General Surgery Why: 05/09/21 at 11 am. Please bring a copy of your photo ID and insurance card to the appointment. Please arrive 30 minutes prior to your appointment for paperwork.  Contact information: [address] [place] [address] [phone]               Signed: Clovis Pu Antoneo Ghrist md 04/23/2021, 8:50 AM

## 2021-04-23 NOTE — Progress Notes (Signed)
Reviewed written d/c instructions w pt and her daughter and all questions answered. They verb understanding. Reviewed diet info for post-op GB and they also verb understanding. D/c per w/c w all belongings in stable condition.

## 2021-04-23 NOTE — Progress Notes (Addendum)
3 Days Post-Op   Subjective/Chief Complaint: Patient with diarrhea but seems less.  No significant abdominal pain reported.  C. difficile antigen tests were negative.   Objective: Vital signs in last 24 hours: Temp:  [98.4 F (36.9 C)-98.6 F (37 C)] 98.6 F (37 C) (05/08 0421) Pulse Rate:  [86-95] 93 (05/08 0421) Resp:  [18-19] 18 (05/08 0421) BP: (162-171)/(91-123) 166/91 (05/08 0421) SpO2:  [95 %-100 %] 100 % (05/08 0421) Last BM Date: 04/23/21  Intake/Output from previous day: 05/07 0701 - 05/08 0700 In: 1620 [P.O.:1320; I.V.:300] Out: 500 [Urine:500] Intake/Output this shift: No intake/output data recorded.  General appearance: alert and cooperative Resp: no wheezes Cardio: NSR Incision/Wound:CDI  PORT SITES no peritonitis  Right buttock furuncle stable Lab Results:  Recent Labs    04/21/21 0341 04/22/21 0523  WBC 9.9 7.2  HGB 11.2* 11.0*  HCT 34.7* 34.6*  PLT 194 219   BMET Recent Labs    04/21/21 0341 04/22/21 0523  NA 138 140  K 4.4 4.3  CL 100 101  CO2 27 27  GLUCOSE 138* 94  BUN 8 6  CREATININE 0.82 0.97  CALCIUM 9.0 9.2   PT/INR No results for input(s): LABPROT, INR in the last 72 hours. ABG No results for input(s): PHART, HCO3 in the last 72 hours.  Invalid input(s): PCO2, PO2  Studies/Results: No results found.  Anti-infectives: Anti-infectives (From admission, onward)   Start     Dose/Rate Route Frequency Ordered Stop   04/20/21 1800  valACYclovir (VALTREX) tablet 1,000 mg        1,000 mg Oral 3 times daily 04/20/21 1709 04/27/21 1559   04/19/21 2100  piperacillin-tazobactam (ZOSYN) IVPB 3.375 g        3.375 g 12.5 mL/hr over 240 Minutes Intravenous Every 8 hours 04/19/21 1657     04/19/21 1430  piperacillin-tazobactam (ZOSYN) IVPB 3.375 g        3.375 g 100 mL/hr over 30 Minutes Intravenous  Once 04/19/21 1426 04/19/21 1517      Assessment/Plan: s/p Procedure(s): LAPAROSCOPIC CHOLECYSTECTOMY WITH INTRAOPERATIVE  CHOLANGIOGRAM (N/A) C. difficile negative.  Diarrhea secondary to postcholecystectomy.  Recommend cholestyramine with meals, Imodium as needed.  Plan for discharge later today.  Plan 5 days doxycycline 50 mg p.o. twice daily for furuncle right buttock  LOS: 3 days    Dortha Schwalbe MD  04/23/2021

## 2021-04-24 LAB — CULTURE, BLOOD (ROUTINE X 2)
Culture: NO GROWTH
Culture: NO GROWTH
Special Requests: ADEQUATE
Special Requests: ADEQUATE

## 2021-05-08 ENCOUNTER — Ambulatory Visit (HOSPITAL_COMMUNITY)
Admission: EM | Admit: 2021-05-08 | Discharge: 2021-05-08 | Disposition: A | Discharge status: Home / Self Care | Payer: BC Managed Care – PPO | Attending: Emergency Medicine | Admitting: Emergency Medicine

## 2021-05-08 ENCOUNTER — Encounter (HOSPITAL_COMMUNITY): Payer: Self-pay | Admitting: Emergency Medicine

## 2021-05-08 ENCOUNTER — Other Ambulatory Visit: Payer: Self-pay

## 2021-05-08 DIAGNOSIS — R21 Rash and other nonspecific skin eruption: Secondary | ICD-10-CM

## 2021-05-08 MED ORDER — FAMOTIDINE 20 MG PO TABS: 20.0000 mg | ORAL_TABLET | Freq: Every day | ORAL | 0 refills | Status: DC

## 2021-05-08 MED ORDER — LORATADINE 10 MG PO TABS: 10.0000 mg | ORAL_TABLET | Freq: Every day | ORAL | 0 refills | Status: DC

## 2021-05-08 MED ORDER — HYDROCORTISONE 1 % EX CREA: TOPICAL_CREAM | CUTANEOUS | 0 refills | Status: DC

## 2021-05-08 NOTE — ED Triage Notes (Signed)
Fine red rash to left axilla.  This has been present for 1 1/2 weeks.  Patient has not seen a physician for this until now.  Patient has used lotrimin antifungal cream.

## 2021-05-08 NOTE — ED Provider Notes (Signed)
[facility]    CSN: [id number] Arrival date & time: 05/08/21  1507      History   Chief Complaint Chief Complaint  Patient presents with  . Rash    HPI Holly Roberts is a 55 y.o. female.   Patient presents with red rash to the left armpit for 1.5 weeks. Denies precipitating event, itching, pain, tenderness, warmth, discharge, fever, chills and area spreading. Denies changes in toiletries, linen, clothing and diet. Has being using lotrimin cream with no improvement.   Past Medical History:  Diagnosis Date  . Hypertension     Patient Active Problem List   Diagnosis Date Noted  . Cholecystitis with cholelithiasis 04/19/2021    Past Surgical History:  Procedure Laterality Date  . CHOLECYSTECTOMY N/A 04/20/2021   Procedure: LAPAROSCOPIC CHOLECYSTECTOMY WITH INTRAOPERATIVE CHOLANGIOGRAM;  Surgeon: [name], MD;  Location: [facility];  Service: General;  Laterality: N/A;  . Right knee surgery    . TONSILLECTOMY      OB History   No obstetric history on file.      Home Medications    Prior to Admission medications   Medication Sig Start Date End Date Taking? Authorizing Provider  Armodafinil 250 MG tablet Take 250 mg by mouth every morning. 04/09/21  Yes [provider]  diclofenac (VOLTAREN) 50 MG EC tablet Take 50 mg by mouth daily. 02/08/21  Yes [provider]  famotidine (PEPCID) 20 MG tablet Take 1 tablet (20 mg total) by mouth daily. 05/08/21  Yes Salli Quarry R, NP  hydrocortisone cream 1 % Apply to affected area 2 times daily 05/08/21  Yes Monserath Neff R, NP  loratadine (CLARITIN) 10 MG tablet Take 1 tablet (10 mg total) by mouth daily. 05/08/21  Yes Jancy Sprankle, Elita Boone, NP  metoprolol tartrate (LOPRESSOR) 25 MG tablet Take 1 tablet (25 mg total) by mouth daily. 04/18/21  Yes Garlon Hatchet, PA-C  NUCYNTA 100 MG TABS Take 200 mg by mouth daily in the afternoon. 03/27/21  Yes [provider]  TRINTELLIX 10 MG TABS tablet  Take 10 mg by mouth daily. 04/09/21  Yes [provider]  VRAYLAR 4.5 MG CAPS Take 4.5 mg by mouth daily. 04/06/21  Yes [provider]  cholestyramine light (PREVALITE) 4 g packet Take 1 packet (4 g total) by mouth 3 (three) times daily. 04/23/21   Cornett, Maisie Fus, MD  doxycycline (VIBRAMYCIN) 50 MG capsule Take 1 capsule (50 mg total) by mouth 2 (two) times daily. Patient not taking: Reported on 05/08/2021 04/23/21   Harriette Bouillon, MD  ibuprofen (ADVIL) 800 MG tablet Take 1 tablet (800 mg total) by mouth every 8 (eight) hours as needed. 04/23/21   Harriette Bouillon, MD  NUCYNTA ER 200 MG TB12 Take 200 mg by mouth 2 (two) times daily. 03/27/21   [provider]  ondansetron (ZOFRAN-ODT) 4 MG disintegrating tablet Take 1 tablet (4 mg total) by mouth every 6 (six) hours as needed for nausea. 04/23/21   Cornett, Maisie Fus, MD  traMADol (ULTRAM) 50 MG tablet Take 1 tablet (50 mg total) by mouth every 6 (six) hours as needed. 04/23/21 04/23/22  Harriette Bouillon, MD    Family History Family History  Problem Relation Age of Onset  . Cancer Father     Social History Social History   Tobacco Use  . Smoking status: Never Smoker  . Smokeless tobacco: Never Used  Vaping Use  . Vaping Use: Some days  Substance Use Topics  . Alcohol use: Never  .  Drug use: Never     Allergies   Buprenorphine, Amoxicillin-pot clavulanate, and Sulfa antibiotics   Review of Systems Review of Systems  Constitutional: Negative.   Respiratory: Negative.   Cardiovascular: Negative.   Musculoskeletal: Negative.   Skin: Positive for rash. Negative for color change, pallor and wound.  Neurological: Negative.      Physical Exam Triage Vital Signs ED Triage Vitals  Enc Vitals Group     BP 05/08/21 1622 (!) 129/91     Pulse Rate 05/08/21 1622 95     Resp 05/08/21 1622 (!) 22     Temp 05/08/21 1622 98.8 F (37.1 C)     Temp Source 05/08/21 1622 Oral     SpO2 05/08/21 1622 97 %     Weight --       Height --      Head Circumference --      Peak Flow --      Pain Score 05/08/21 1617 0     Pain Loc --      Pain Edu? --      Excl. in GC? --    No data found.  Updated Vital Signs BP (!) 129/91 (BP Location: Right Arm) Comment (BP Location): regular cuff, forearm  Pulse 95   Temp 98.8 F (37.1 C) (Oral)   Resp (!) 22   SpO2 97%   Visual Acuity Right Eye Distance:   Left Eye Distance:   Bilateral Distance:    Right Eye Near:   Left Eye Near:    Bilateral Near:     Physical Exam Constitutional:      Appearance: Normal appearance. She is obese.  HENT:     Head: Normocephalic.  Eyes:     Extraocular Movements: Extraocular movements intact.  Pulmonary:     Effort: Pulmonary effort is normal.  Musculoskeletal:        General: Normal range of motion.     Cervical back: Normal range of motion.  Skin:    Comments: Diffuse macular rash on lower area of left arm pit   Neurological:     Mental Status: She is alert and oriented to person, place, and time. Mental status is at baseline.  Psychiatric:        Mood and Affect: Mood normal.        Behavior: Behavior normal.        Thought Content: Thought content normal.        Judgment: Judgment normal.      UC Treatments / Results  Labs (all labs ordered are listed, but only abnormal results are displayed) Labs Reviewed - No data to display  EKG   Radiology No results found.  Procedures Procedures (including critical care time)  Medications Ordered in UC Medications - No data to display  Initial Impression / Assessment and Plan / UC Course  I have reviewed the triage vital signs and the nursing notes.  Pertinent labs & imaging results that were available during my care of the patient were reviewed by me and considered in my medical decision making (see chart for details).  Rash  1. Hydrocortisone cream 1% twice a day 2. Claritin 10 mg and famotidine 20 mg daily for 7 days 3. Discussed signs of worsening  infection and when to follow up for care, verbalized understanding   Final Clinical Impressions(s) / UC Diagnoses   Final diagnoses:  Rash     Discharge Instructions     Use cream over area twice a  day after cleansing  Take claritin and famotidine daily for 7 days  Can follow up with spreading of rash, tenderness, pain, area becoming hot, swelling, drainage, fever or chills for reevaluation    ED Prescriptions    Medication Sig Dispense Auth. Provider   hydrocortisone cream 1 % Apply to affected area 2 times daily 15 g Leighton Luster R, NP   famotidine (PEPCID) 20 MG tablet Take 1 tablet (20 mg total) by mouth daily. 7 tablet Masiel Gentzler R, NP   loratadine (CLARITIN) 10 MG tablet Take 1 tablet (10 mg total) by mouth daily. 7 tablet Sherie Dobrowolski, Elita Boone, NP     PDMP not reviewed this encounter.   Valinda Hoar, NP 05/08/21 1843

## 2021-05-08 NOTE — Discharge Instructions (Addendum)
Use cream over area twice a day after cleansing  Take claritin and famotidine daily for 7 days  Can follow up with spreading of rash, tenderness, pain, area becoming hot, swelling, drainage, fever or chills for reevaluation

## 2021-06-09 ENCOUNTER — Emergency Department (HOSPITAL_COMMUNITY): Payer: BC Managed Care – PPO

## 2021-06-09 ENCOUNTER — Encounter (HOSPITAL_COMMUNITY): Payer: Self-pay | Admitting: Emergency Medicine

## 2021-06-09 ENCOUNTER — Observation Stay (HOSPITAL_COMMUNITY)
Admission: EM | Admit: 2021-06-09 | Discharge: 2021-06-10 | Disposition: A | Discharge status: Home / Self Care | Payer: BC Managed Care – PPO | Attending: Internal Medicine | Admitting: Internal Medicine

## 2021-06-09 ENCOUNTER — Other Ambulatory Visit: Payer: Self-pay

## 2021-06-09 DIAGNOSIS — I10 Essential (primary) hypertension: Secondary | ICD-10-CM | POA: Diagnosis not present

## 2021-06-09 DIAGNOSIS — N179 Acute kidney failure, unspecified: Principal | ICD-10-CM | POA: Diagnosis present

## 2021-06-09 DIAGNOSIS — R748 Abnormal levels of other serum enzymes: Secondary | ICD-10-CM | POA: Diagnosis not present

## 2021-06-09 DIAGNOSIS — F410 Panic disorder [episodic paroxysmal anxiety] without agoraphobia: Secondary | ICD-10-CM

## 2021-06-09 DIAGNOSIS — R1110 Vomiting, unspecified: Secondary | ICD-10-CM

## 2021-06-09 DIAGNOSIS — R197 Diarrhea, unspecified: Secondary | ICD-10-CM

## 2021-06-09 DIAGNOSIS — E872 Acidosis, unspecified: Secondary | ICD-10-CM

## 2021-06-09 DIAGNOSIS — G894 Chronic pain syndrome: Secondary | ICD-10-CM

## 2021-06-09 DIAGNOSIS — R112 Nausea with vomiting, unspecified: Secondary | ICD-10-CM

## 2021-06-09 DIAGNOSIS — F319 Bipolar disorder, unspecified: Secondary | ICD-10-CM

## 2021-06-09 DIAGNOSIS — F419 Anxiety disorder, unspecified: Secondary | ICD-10-CM | POA: Diagnosis not present

## 2021-06-09 DIAGNOSIS — Z20822 Contact with and (suspected) exposure to covid-19: Secondary | ICD-10-CM | POA: Insufficient documentation

## 2021-06-09 DIAGNOSIS — Z79899 Other long term (current) drug therapy: Secondary | ICD-10-CM | POA: Diagnosis not present

## 2021-06-09 DIAGNOSIS — E162 Hypoglycemia, unspecified: Secondary | ICD-10-CM

## 2021-06-09 DIAGNOSIS — F32A Depression, unspecified: Secondary | ICD-10-CM

## 2021-06-09 DIAGNOSIS — K760 Fatty (change of) liver, not elsewhere classified: Secondary | ICD-10-CM | POA: Insufficient documentation

## 2021-06-09 DIAGNOSIS — G8929 Other chronic pain: Secondary | ICD-10-CM

## 2021-06-09 DIAGNOSIS — M545 Low back pain, unspecified: Secondary | ICD-10-CM

## 2021-06-09 HISTORY — DX: Depression, unspecified: F32.A

## 2021-06-09 HISTORY — DX: Bipolar disorder, unspecified: F31.9

## 2021-06-09 HISTORY — DX: Anxiety disorder, unspecified: F41.9

## 2021-06-09 HISTORY — DX: Other chronic pain: G89.29

## 2021-06-09 LAB — COMPREHENSIVE METABOLIC PANEL
ALT: 19 U/L (ref 0–44)
AST: 29 U/L (ref 15–41)
Albumin: 4.7 g/dL (ref 3.5–5.0)
Alkaline Phosphatase: 129 U/L — ABNORMAL HIGH (ref 38–126)
Anion gap: >20 — ABNORMAL HIGH (ref 5–15)
BUN: 45 mg/dL — ABNORMAL HIGH (ref 6–20)
CO2: 12 mmol/L — ABNORMAL LOW (ref 22–32)
Calcium: 8.4 mg/dL — ABNORMAL LOW (ref 8.9–10.3)
Chloride: 99 mmol/L (ref 98–111)
Creatinine, Ser: 2.65 mg/dL — ABNORMAL HIGH (ref 0.44–1.00)
GFR, Estimated: 21 mL/min — ABNORMAL LOW (ref >60)
Glucose, Bld: 66 mg/dL — ABNORMAL LOW (ref 70–99)
Potassium: 4.2 mmol/L (ref 3.5–5.1)
Sodium: 137 mmol/L (ref 135–145)
Total Bilirubin: 2.2 mg/dL — ABNORMAL HIGH (ref 0.3–1.2)
Total Protein: 7.8 g/dL (ref 6.5–8.1)

## 2021-06-09 LAB — CBC WITH DIFFERENTIAL/PLATELET
Abs Immature Granulocytes: 0.04 10*3/uL (ref 0.00–0.07)
Basophils Absolute: 0 10*3/uL (ref 0.0–0.1)
Basophils Relative: 0 %
Eosinophils Absolute: 0 10*3/uL (ref 0.0–0.5)
Eosinophils Relative: 0 %
HCT: 42.6 % (ref 36.0–46.0)
Hemoglobin: 13.2 g/dL (ref 12.0–15.0)
Immature Granulocytes: 0 %
Lymphocytes Relative: 10 %
Lymphs Abs: 0.9 10*3/uL (ref 0.7–4.0)
MCH: 29 pg (ref 26.0–34.0)
MCHC: 31 g/dL (ref 30.0–36.0)
MCV: 93.6 fL (ref 80.0–100.0)
Monocytes Absolute: 0.8 10*3/uL (ref 0.1–1.0)
Monocytes Relative: 9 %
Neutro Abs: 7.3 10*3/uL (ref 1.7–7.7)
Neutrophils Relative %: 81 %
Platelets: 266 10*3/uL (ref 150–400)
RBC: 4.55 MIL/uL (ref 3.87–5.11)
RDW: 15.2 % (ref 11.5–15.5)
WBC: 9.1 10*3/uL (ref 4.0–10.5)
nRBC: 0 % (ref 0.0–0.2)

## 2021-06-09 LAB — RESP PANEL BY RT-PCR (FLU A&B, COVID) ARPGX2
Influenza A by PCR: NEGATIVE
Influenza B by PCR: NEGATIVE
SARS Coronavirus 2 by RT PCR: NEGATIVE

## 2021-06-09 LAB — CBG MONITORING, ED
Glucose-Capillary: 64 mg/dL — ABNORMAL LOW (ref 70–99)
Glucose-Capillary: 72 mg/dL (ref 70–99)

## 2021-06-09 LAB — LIPASE, BLOOD: Lipase: 263 U/L — ABNORMAL HIGH (ref 11–51)

## 2021-06-09 MED ORDER — ONDANSETRON HCL 4 MG/2ML IJ SOLN: 4.0000 mg | Freq: Four times a day (QID) | INTRAMUSCULAR | Status: DC | PRN

## 2021-06-09 MED ORDER — ACETAMINOPHEN 650 MG RE SUPP: 650.0000 mg | Freq: Four times a day (QID) | RECTAL | Status: DC | PRN

## 2021-06-09 MED ORDER — SODIUM CHLORIDE 0.9 % IV SOLN: INTRAVENOUS | Status: DC

## 2021-06-09 MED ORDER — SODIUM CHLORIDE 0.9 % IV BOLUS
1000.0000 mL | Freq: Once | INTRAVENOUS | Status: AC
  Administered 2021-06-09: 1000 mL via INTRAVENOUS

## 2021-06-09 MED ORDER — FENTANYL CITRATE (PF) 100 MCG/2ML IJ SOLN
50.0000 ug | Freq: Once | INTRAMUSCULAR | Status: AC
  Administered 2021-06-09: 50 ug via INTRAVENOUS
  Filled 2021-06-09: qty 2

## 2021-06-09 MED ORDER — TAPENTADOL HCL 50 MG PO TABS
200.0000 mg | ORAL_TABLET | Freq: Every day | ORAL | Status: DC
  Administered 2021-06-10: 200 mg via ORAL
  Filled 2021-06-09 (×2): qty 4

## 2021-06-09 MED ORDER — TAPENTADOL HCL ER 100 MG PO TB12
200.0000 mg | ORAL_TABLET | Freq: Two times a day (BID) | ORAL | Status: DC
  Administered 2021-06-09 – 2021-06-10 (×2): 200 mg via ORAL
  Filled 2021-06-09 (×2): qty 2

## 2021-06-09 MED ORDER — ONDANSETRON HCL 4 MG PO TABS: 4.0000 mg | ORAL_TABLET | Freq: Four times a day (QID) | ORAL | Status: DC | PRN

## 2021-06-09 MED ORDER — ONDANSETRON HCL 4 MG/2ML IJ SOLN
4.0000 mg | Freq: Once | INTRAMUSCULAR | Status: AC
  Administered 2021-06-09: 4 mg via INTRAVENOUS
  Filled 2021-06-09: qty 2

## 2021-06-09 MED ORDER — SODIUM BICARBONATE 650 MG PO TABS
650.0000 mg | ORAL_TABLET | Freq: Three times a day (TID) | ORAL | Status: DC
  Administered 2021-06-09 – 2021-06-10 (×3): 650 mg via ORAL
  Filled 2021-06-09 (×3): qty 1

## 2021-06-09 MED ORDER — ACETAMINOPHEN 325 MG PO TABS: 650.0000 mg | ORAL_TABLET | Freq: Four times a day (QID) | ORAL | Status: DC | PRN

## 2021-06-09 MED ORDER — SODIUM CHLORIDE 0.9% FLUSH
3.0000 mL | Freq: Two times a day (BID) | INTRAVENOUS | Status: DC
  Administered 2021-06-10: 3 mL via INTRAVENOUS

## 2021-06-09 NOTE — ED Provider Notes (Signed)
Patient is a 55 year old female presenting with nausea, vomiting, diarrhea x 5 days.  It appears the patient had a gastroenteritis leading to dehydration and subsequent AKI.  CT scan results are pending during signout.  Care is taken over from [place] and [place], please refer to her note for a more thorough history.  Physical Exam  BP (!) 189/72 (BP Location: Right Arm)   Pulse (!) 114   Temp 98.1 F (36.7 C) (Oral)   Resp 17   Ht 5\' 4"  (1.626 m)   Wt 122 kg   SpO2 98%   BMI 46.17 kg/m   Physical Exam Vitals and nursing note reviewed. Exam conducted with a chaperone present.  Constitutional:      General: She is not in acute distress.    Appearance: Normal appearance. She is ill-appearing.  HENT:     Head: Normocephalic and atraumatic.  Eyes:     General: No scleral icterus.    Extraocular Movements: Extraocular movements intact.     Pupils: Pupils are equal, round, and reactive to light.  Cardiovascular:     Rate and Rhythm: Regular rhythm. Tachycardia present.  Skin:    Coloration: Skin is not jaundiced.  Neurological:     Mental Status: She is alert. Mental status is at baseline.     Coordination: Coordination normal.    ED Course/Procedures   Clinical Course as of 06/09/21 1717  Fri Jun 09, 2021  1655 CT ABDOMEN PELVIS WO CONTRAST No signs of pancreatitis  [HS]    Clinical Course User Index [HS] Jun 11, 2021, PA-C    Procedures  MDM  Patient presents with new AKI.  She is tachycardic to 114, additionally her lipase is elevated but the CT scan does not show signs of pancreatitis.  I am not worried about pancreatitis given these findings.  I am concerned about her AKI.  She has been given fluid resuscitation during her visit in the ED today.  She will need admission for dehydration and AKI.  Consult to hospitalist placed.  Spoke with hospitalist Dr. Theron Arista who is happy to admit the patient.      Irene Limbo, PA-C 06/09/21 1718    Little, 06/11/21,  MD 06/10/21 [phone]

## 2021-06-09 NOTE — Assessment & Plan Note (Addendum)
--   Secondary to nausea and vomiting and very poor oral intake (prerenal). -- Anion gap metabolic acidosis probably from loss of bicarb and mild uremia. No h/o alcohol use. -Responded well to IV fluids; creatinine 2.01 at discharge improved from 2.65 and should continue resolving now that oral intake has improved and she can keep up with nutrition at home - Repeat BMP and follow-up as able

## 2021-06-09 NOTE — ED Notes (Signed)
Patient is resting- appears restless and anxious at times.  Lab has drawn blood work.

## 2021-06-09 NOTE — Assessment & Plan Note (Signed)
Continue medications

## 2021-06-09 NOTE — Assessment & Plan Note (Addendum)
--   Secondary to poor oral intake.  -Resolved with fluids and as appetite improved during hospitalization

## 2021-06-09 NOTE — Assessment & Plan Note (Addendum)
--  resolved per patient, abd benign

## 2021-06-09 NOTE — ED Notes (Signed)
Patient is drinking 2 cups of Crangrape juice- tolerated well

## 2021-06-09 NOTE — Assessment & Plan Note (Signed)
--   Continue chronic pain medications, verified with patient

## 2021-06-09 NOTE — ED Notes (Signed)
Message sent to Tama Gander, RN and Renette Butters, RN to review chart.

## 2021-06-09 NOTE — ED Notes (Signed)
Per Renette Butters, floor is ready for patient. Will arrange for pt to be taken up.

## 2021-06-09 NOTE — Assessment & Plan Note (Addendum)
Resolved with fluids °

## 2021-06-09 NOTE — Assessment & Plan Note (Addendum)
--   No abdominal pain or CT evidence of pancreatitis - Lipase had also improved prior to discharge - No further work-up indicated

## 2021-06-09 NOTE — Assessment & Plan Note (Signed)
--   Continue chronic medications

## 2021-06-09 NOTE — H&P (Signed)
History and Physical  Holly Roberts ZOX:[id number] DOB: 1966-06-14 DOA: 06/09/2021  PCP: Pcp, No   Chief Complaint: feels bad  HPI:  69yow PMH psychiatric disease presented w/ h/o vomiting and diarrhea for the last 2 to 5 days.  Admitted for acute kidney injury.  Symptoms began 6/20 with multiple episodes of vomiting and diarrhea, no blood.  Vomiting resolved 6/21 but diarrhea persisted for the next several days.  Very poor to no oral intake.  Diarrhea resolved yesterday but came to the emergency department today because "feeling really bad".  No specific aggravating or alleviating factors.  No sick contacts noted.  Chart review: Hospitalized in May for acute cholecystitis, status post laparoscopic cholecystectomy. No recent office visits  ED Course: IVF   Review of Systems:  Positive for subjective fever, negative for visual changes, sore throat, rash, new muscle aches, chest pain, shortness of breath, dysuria, bleeding  PMH Anxiety, bipolar, depression Chronic pain Remainder reviewed in Epic  PSH Cholecystectomy Remainder reviewed in Epic  Family history includes: Father with liver cancer Mother with autoimmune liver disease  Social History Non-smoker, nondrinker, no drugs  Allergies Adhesive, sulfa causes rash Remainder reviewed in Epic  Medications Current Outpatient Medications  Medication Instructions   Armodafinil 250 mg, Oral, Every morning   cholestyramine light (PREVALITE) 4 g, Oral, 3 times daily   diclofenac (VOLTAREN) 50 mg, Oral, Daily   doxepin (SINEQUAN) 100 mg, Oral, Daily at bedtime   doxycycline (VIBRAMYCIN) 50 mg, Oral, 2 times daily   famotidine (PEPCID) 20 mg, Oral, Daily   Horizant 600-1,200 mg, Oral, 2 times daily, Take 1 tablet (600 mg) in the morning and Take 2 tablets (1200 mg) at bedtime   hydrocortisone cream 1 % Apply to affected area 2 times daily   ibuprofen (ADVIL) 800 mg, Oral, Every 8 hours PRN   loratadine (CLARITIN) 10 mg, Oral,  Daily   metoprolol tartrate (LOPRESSOR) 25 mg, Oral, Daily   Nucynta ER 200 mg, Oral, 2 times daily   Nucynta 200 mg, Oral, Daily   ondansetron (ZOFRAN-ODT) 4 mg, Oral, Every 6 hours PRN   traMADol (ULTRAM) 50 mg, Oral, Every 6 hours PRN   Trintellix 20 mg, Oral, Daily   Vraylar 4.5 mg, Oral, Daily    Physicial Exam   Vitals:  Vitals:   06/09/21 1545 06/09/21 1643  BP: (!) 185/64 (!) 189/72  Pulse: (!) 118 (!) 114  Resp: 15 17  Temp:  98.1 F (36.7 C)  SpO2: 96% 98%   Constitutional:   Appears calm and comfortable Eyes:  pupils and irises appear normal Normal lids   ENMT:  grossly normal hearing  Lips appear normal Neck:  neck appears normal Respiratory:  CTA bilaterally, no w/r/r.  Respiratory effort normal.  Cardiovascular:  RRR, no m/r/g No LE extremity edema   Abdomen:  Abdomen appears normal; no tenderness or masses No hernias noted Musculoskeletal:  Digits/nails BUE: no clubbing, cyanosis, petechiae, infection UE, LUE, RLE, LLE   Grossly normal tone and strength Skin:  No rashes, lesions, ulcers palpation of skin: no induration or nodules Psychiatric:  Mental status Mood, affect appropriate judgment and insight appear intact   I have personally reviewed following labs and imaging studies  Labs:  Mild hyperglycemia BUN 45, creatinine 2.65 Lipase 263, remainder LFTs notable for total bilirubin of 2.2 and mildly elevated alkaline phosphatase. CBC unremarkable.  Imaging studies:  CT abdomen and pelvis no acute intra-abdominal process.  Hepatic steatosis.  Medical tests:  EKG independently reviewed:  Sinus tachycardia no acute changes  ASSESSMENT/PLAN  * AKI (acute kidney injury) (HCC) -- Secondary to nausea and vomiting and very poor oral intake (prerenal). -- Anion gap metabolic acidosis probably from loss of bicarb and mild uremia. No h/o alcohol use.  High anion gap metabolic acidosis --IVF, bicarb, follow  Hypoglycemia without diagnosis  of diabetes mellitus -- Secondary to poor oral intake.  Monitor.  Chronic pain -- Continue chronic pain medications, verified with patient  Anxiety -- Continue medications  Depression -- Continue medications  Bipolar disorder (HCC) -- Continue chronic medications  Vomiting and diarrhea --resolved per patient, abd benign, will monitor  Elevated lipase -- No abdominal pain.  Hungry.  No clinical significance.  No signs or symptoms to suggest pancreatitis.  DVT prophylaxis: SCDs Code Status: Full Family Communication: none Consults called: none    Time spent: 55 minutes  Brendia Sacks, MD  [facility] Direct contact: see [email or web address]  7PM-7AM contact night coverage as below   Check the care team in [facility] and look for a) attending/consulting [facility] provider listed and b) the [facility] team listed Log into [email or web address] and use [facility]'s universal password to access. If you do not have the password, please contact the hospital operator. Locate the [facility] provider you are looking for under [facility] and page to a number that you can be directly reached. If you still have difficulty reaching the provider, please page the [facility] (Director on Call) for the Hospitalists listed on amion for assistance.  Severity of Illness: The appropriate patient status for this patient is OBSERVATION. Observation status is judged to be reasonable and necessary in order to provide the required intensity of service to ensure the patient's safety. The patient's presenting symptoms, physical exam findings, and initial radiographic and laboratory data in the context of their medical condition is felt to place them at decreased risk for further clinical deterioration. Furthermore, it is anticipated that the patient will be medically stable for discharge from the hospital within 2 midnights of admission. The following factors support the patient status of observation.   " The patient's presenting  symptoms include feels poorly. " The physical exam findings include benigh. " The initial radiographic and laboratory data are AKI, metabolic acidosis.  Status is: Observation  The patient remains OBS appropriate and will d/c before 2 midnights.  Dispo: The patient is from: Home              Anticipated d/c is to: Home              Patient currently is not medically stable to d/c.   Difficult to place patient No  06/09/2021, 7:43 PM   Principal Problem:   AKI (acute kidney injury) (HCC) Active Problems:   High anion gap metabolic acidosis   Hypoglycemia without diagnosis of diabetes mellitus   Bipolar disorder (HCC)   Depression   Anxiety   Chronic pain   Elevated lipase   Vomiting and diarrhea

## 2021-06-09 NOTE — ED Triage Notes (Signed)
Patient  arrives via EMS from her home with co nausea, vomiting and diarrhea x 4 days.  Patient reports increase weakness.  Patient has history of bipolar and has not had any of her medications in 3 days.  Patient is alert and oriented and answers all questions.

## 2021-06-09 NOTE — ED Notes (Signed)
Returns from CT 

## 2021-06-09 NOTE — Progress Notes (Signed)
PPE d/c per protocol   Results for DAMIRA, KEM (MRN [id number]) as of 06/09/2021 19:01  Ref. Range 06/09/2021 12:51  Influenza A By PCR Latest Ref Range: NEGATIVE  NEGATIVE  Influenza B By PCR Latest Ref Range: NEGATIVE  NEGATIVE  SARS Coronavirus 2 by RT PCR Latest Ref Range: NEGATIVE  NEGATIVE    SRP, RN

## 2021-06-09 NOTE — ED Notes (Signed)
Per nurse patient was given milk, cranberry juice, and applesauce. The PA came in while I was in the room and asked that the CBG be checked again in about 10 minutes.

## 2021-06-09 NOTE — ED Provider Notes (Signed)
[facility]-EMERGENCY DEPT Provider Note   CSN: [id number] Arrival date & time: 06/09/21  1015     History Chief Complaint  Patient presents with   Nausea    Holly Roberts is a 55 y.o. female with prior past medical history of hypertension and recent cholecystectomy that presents to the emerge department today for nausea vomiting diarrhea and weakness for the past 5 days.  Patient states that she started having the symptoms about 5 days ago, however states that symptoms have been getting better.  States that she has been able to tolerate some fluids today.  Denies any abdominal pain.  Denies any bloody emesis or bloody diarrhea.  Patient did have cholecystectomy without any immediate complications in May 2022, last month.  Denies any fevers.  Daughter who was present in the room states that she has also been coughing for the past 4 days when symptoms started.  Denies any other URI symptoms, no myalgias.  Patient states that she has been vaccinated against COVID and not been boosted.  States that she feels extremely dehydrated, has not been able to take any of her medications including her bipolar medications.   HPI     Past Medical History:  Diagnosis Date   Hypertension     Patient Active Problem List   Diagnosis Date Noted   Cholecystitis with cholelithiasis 04/19/2021    Past Surgical History:  Procedure Laterality Date   CHOLECYSTECTOMY N/A 04/20/2021   Procedure: LAPAROSCOPIC CHOLECYSTECTOMY WITH INTRAOPERATIVE CHOLANGIOGRAM;  Surgeon: [name], MD;  Location: [facility] ORS;  Service: General;  Laterality: N/A;   Right knee surgery     TONSILLECTOMY       OB History   No obstetric history on file.     Family History  Problem Relation Age of Onset   Cancer Father     Social History   Tobacco Use   Smoking status: Never   Smokeless tobacco: Never  Vaping Use   Vaping Use: Some days  Substance Use Topics   Alcohol use: Never   Drug use:  Never    Home Medications Prior to Admission medications   Medication Sig Start Date End Date Taking? Authorizing Provider  Armodafinil 250 MG tablet Take 250 mg by mouth every morning. 04/09/21   [provider]  cholestyramine light (PREVALITE) 4 g packet Take 1 packet (4 g total) by mouth 3 (three) times daily. 04/23/21   Cornett, Maisie Fus, MD  diclofenac (VOLTAREN) 50 MG EC tablet Take 50 mg by mouth daily. 02/08/21   [provider]  doxycycline (VIBRAMYCIN) 50 MG capsule Take 1 capsule (50 mg total) by mouth 2 (two) times daily. Patient not taking: Reported on 05/08/2021 04/23/21   Harriette Bouillon, MD  famotidine (PEPCID) 20 MG tablet Take 1 tablet (20 mg total) by mouth daily. 05/08/21   Valinda Hoar, NP  hydrocortisone cream 1 % Apply to affected area 2 times daily 05/08/21   Valinda Hoar, NP  ibuprofen (ADVIL) 800 MG tablet Take 1 tablet (800 mg total) by mouth every 8 (eight) hours as needed. 04/23/21   Cornett, Maisie Fus, MD  loratadine (CLARITIN) 10 MG tablet Take 1 tablet (10 mg total) by mouth daily. 05/08/21   Valinda Hoar, NP  metoprolol tartrate (LOPRESSOR) 25 MG tablet Take 1 tablet (25 mg total) by mouth daily. 04/18/21   Garlon Hatchet, PA-C  NUCYNTA 100 MG TABS Take 200 mg by mouth daily in the afternoon. 03/27/21   [provider]  NUCYNTA ER 200 MG TB12 Take 200 mg by mouth 2 (two) times daily. 03/27/21   [provider]  ondansetron (ZOFRAN-ODT) 4 MG disintegrating tablet Take 1 tablet (4 mg total) by mouth every 6 (six) hours as needed for nausea. 04/23/21   Cornett, Maisie Fus, MD  traMADol (ULTRAM) 50 MG tablet Take 1 tablet (50 mg total) by mouth every 6 (six) hours as needed. 04/23/21 04/23/22  Cornett, Maisie Fus, MD  TRINTELLIX 10 MG TABS tablet Take 10 mg by mouth daily. 04/09/21   [provider]  VRAYLAR 4.5 MG CAPS Take 4.5 mg by mouth daily. 04/06/21   [provider]    Allergies    Buprenorphine, Amoxicillin-pot  clavulanate, and Sulfa antibiotics  Review of Systems   Review of Systems  Constitutional:  Negative for chills, diaphoresis, fatigue and fever.  HENT:  Negative for congestion, sore throat and trouble swallowing.   Eyes:  Negative for pain and visual disturbance.  Respiratory:  Negative for cough, shortness of breath and wheezing.   Cardiovascular:  Negative for chest pain, palpitations and leg swelling.  Gastrointestinal:  Positive for diarrhea, nausea and vomiting. Negative for abdominal distention and abdominal pain.  Genitourinary:  Negative for difficulty urinating.  Musculoskeletal:  Negative for back pain, neck pain and neck stiffness.  Skin:  Negative for pallor.  Neurological:  Negative for dizziness, speech difficulty, weakness and headaches.  Psychiatric/Behavioral:  Negative for confusion.    Physical Exam Updated Vital Signs BP (!) 171/65   Pulse (!) 113   Temp 97.9 F (36.6 C) (Oral)   Resp (!) 26   Ht 5\' 4"  (1.626 m)   Wt 122 kg   SpO2 95%   BMI 46.17 kg/m   Physical Exam Constitutional:      General: She is not in acute distress.    Appearance: Normal appearance. She is ill-appearing. She is not toxic-appearing or diaphoretic.  HENT:     Mouth/Throat:     Mouth: Mucous membranes are dry.     Pharynx: Oropharynx is clear.  Eyes:     General: No scleral icterus.    Extraocular Movements: Extraocular movements intact.     Pupils: Pupils are equal, round, and reactive to light.  Cardiovascular:     Rate and Rhythm: Normal rate and regular rhythm.     Pulses: Normal pulses.     Heart sounds: Normal heart sounds.  Pulmonary:     Effort: Pulmonary effort is normal. No respiratory distress.     Breath sounds: Normal breath sounds. No stridor. No wheezing, rhonchi or rales.  Chest:     Chest wall: No tenderness.  Abdominal:     General: Abdomen is flat. There is no distension.     Palpations: Abdomen is soft.     Tenderness: There is no abdominal  tenderness. There is no guarding or rebound.  Musculoskeletal:        General: No swelling or tenderness. Normal range of motion.     Cervical back: Normal range of motion and neck supple. No rigidity.     Right lower leg: No edema.     Left lower leg: No edema.  Skin:    General: Skin is warm and dry.     Capillary Refill: Capillary refill takes less than 2 seconds.     Coloration: Skin is not pale.  Neurological:     General: No focal deficit present.     Mental Status: She is alert and oriented  to person, place, and time.  Psychiatric:        Mood and Affect: Mood normal.        Behavior: Behavior normal.    ED Results / Procedures / Treatments   Labs (all labs ordered are listed, but only abnormal results are displayed) Labs Reviewed  COMPREHENSIVE METABOLIC PANEL - Abnormal; Notable for the following components:      Result Value   CO2 12 (*)    Glucose, Bld 66 (*)    BUN 45 (*)    Creatinine, Ser 2.65 (*)    Calcium 8.4 (*)    Alkaline Phosphatase 129 (*)    Total Bilirubin 2.2 (*)    GFR, Estimated 21 (*)    Anion gap >20 (*)    All other components within normal limits  LIPASE, BLOOD - Abnormal; Notable for the following components:   Lipase 263 (*)    All other components within normal limits  CBG MONITORING, ED - Abnormal; Notable for the following components:   Glucose-Capillary 64 (*)    All other components within normal limits  RESP PANEL BY RT-PCR (FLU A&B, COVID) ARPGX2  CBC WITH DIFFERENTIAL/PLATELET  URINALYSIS, ROUTINE W REFLEX MICROSCOPIC  CBG MONITORING, ED    EKG None  Radiology No results found.  Procedures Procedures   Medications Ordered in ED Medications  sodium chloride 0.9 % bolus 1,000 mL (1,000 mLs Intravenous New Bag/Given 06/09/21 1246)  ondansetron (ZOFRAN) injection 4 mg (4 mg Intravenous Given 06/09/21 1247)  sodium chloride 0.9 % bolus 1,000 mL (1,000 mLs Intravenous New Bag/Given 06/09/21 1407)    ED Course  I have  reviewed the triage vital signs and the nursing notes.  Pertinent labs & imaging results that were available during my care of the patient were reviewed by me and considered in my medical decision making (see chart for details).    MDM Rules/Calculators/A&P                          Holly Roberts is a 55 y.o. female with prior past medical history of hypertension and recent cholecystectomy that presents to the emerge department today for nausea vomiting diarrhea and weakness for the past 5 days.  Patient appears very dry, is not currently vomiting.  Patient does not have any abdominal pain.  Work-up today shows a CMP with electrolyte derangements including creatinine of 2.65, baseline is .9, this is most likely prerenal since patient is dehydrated.  Glucose 66, patient is currently drinking juice.  Lipase also elevated to 263, repeat exam patient is still not having any abdominal tenderness.  Patient has received 2 L of fluids, lipase elevated at 263.  Will obtain CT imaging to rule out pancreatitis and other causes of nausea vomiting including SBO.  Pt care was handed off to H. Sage PA-C at   330. Complete history and physical and current plan have been communicated.  Please refer to their note for the remainder of ED care and ultimate disposition.  Plan: Patient to be admitted for dehydration and AKI, will also need CT imaging.  Continuously monitor CBG, if CBG drops again consider dextrose.  Once CT is obtained patient to be admitted.   Final Clinical Impression(s) / ED Diagnoses Final diagnoses:  Intractable nausea and vomiting  AKI (acute kidney injury) (HCC)    Rx / DC Orders ED Discharge Orders     None        Farrel Gordon,  PA-C 06/09/21 1534    Alvira MondaySchlossman, Erin, MD 06/09/21 2151

## 2021-06-10 ENCOUNTER — Encounter: Payer: Self-pay | Admitting: Internal Medicine

## 2021-06-10 DIAGNOSIS — N179 Acute kidney failure, unspecified: Secondary | ICD-10-CM | POA: Diagnosis not present

## 2021-06-10 LAB — URINALYSIS, ROUTINE W REFLEX MICROSCOPIC
Bacteria, UA: NONE SEEN
Bilirubin Urine: NEGATIVE
Glucose, UA: NEGATIVE mg/dL
Ketones, ur: 20 mg/dL — AB
Nitrite: NEGATIVE
Protein, ur: 30 mg/dL — AB
Specific Gravity, Urine: 1.014 (ref 1.005–1.030)
WBC, UA: >50 WBC/hpf — ABNORMAL HIGH (ref 0–5)
pH: 5 (ref 5.0–8.0)

## 2021-06-10 LAB — COMPREHENSIVE METABOLIC PANEL
ALT: 19 U/L (ref 0–44)
AST: 20 U/L (ref 15–41)
Albumin: 4.2 g/dL (ref 3.5–5.0)
Alkaline Phosphatase: 112 U/L (ref 38–126)
Anion gap: 10 (ref 5–15)
BUN: 38 mg/dL — ABNORMAL HIGH (ref 6–20)
CO2: 20 mmol/L — ABNORMAL LOW (ref 22–32)
Calcium: 8.3 mg/dL — ABNORMAL LOW (ref 8.9–10.3)
Chloride: 106 mmol/L (ref 98–111)
Creatinine, Ser: 2.01 mg/dL — ABNORMAL HIGH (ref 0.44–1.00)
GFR, Estimated: 29 mL/min — ABNORMAL LOW (ref >60)
Glucose, Bld: 111 mg/dL — ABNORMAL HIGH (ref 70–99)
Potassium: 3.7 mmol/L (ref 3.5–5.1)
Sodium: 136 mmol/L (ref 135–145)
Total Bilirubin: 0.7 mg/dL (ref 0.3–1.2)
Total Protein: 6.9 g/dL (ref 6.5–8.1)

## 2021-06-10 LAB — CBC
HCT: 42.2 % (ref 36.0–46.0)
Hemoglobin: 13.4 g/dL (ref 12.0–15.0)
MCH: 28.8 pg (ref 26.0–34.0)
MCHC: 31.8 g/dL (ref 30.0–36.0)
MCV: 90.6 fL (ref 80.0–100.0)
Platelets: 173 10*3/uL (ref 150–400)
RBC: 4.66 MIL/uL (ref 3.87–5.11)
RDW: 15.3 % (ref 11.5–15.5)
WBC: 5 10*3/uL (ref 4.0–10.5)
nRBC: 0 % (ref 0.0–0.2)

## 2021-06-10 LAB — LIPASE, BLOOD: Lipase: 144 U/L — ABNORMAL HIGH (ref 11–51)

## 2021-06-10 MED ORDER — DOXEPIN HCL 50 MG PO CAPS: 100.0000 mg | ORAL_CAPSULE | Freq: Every day | ORAL | Status: DC

## 2021-06-10 MED ORDER — CARIPRAZINE HCL 1.5 MG PO CAPS
4.5000 mg | ORAL_CAPSULE | Freq: Every day | ORAL | Status: DC
  Administered 2021-06-10: 4.5 mg via ORAL
  Filled 2021-06-10: qty 3

## 2021-06-10 MED ORDER — VORTIOXETINE HBR 5 MG PO TABS
20.0000 mg | ORAL_TABLET | Freq: Every day | ORAL | Status: DC
  Administered 2021-06-10: 20 mg via ORAL
  Filled 2021-06-10: qty 4

## 2021-06-10 NOTE — Progress Notes (Signed)
Pt discharged to home, instructions reviewed with pt and daughters. Acknowledged understanding of instructions. Work note review with pt and copy submitted. SRP,RN

## 2021-06-10 NOTE — Plan of Care (Signed)

## 2021-06-10 NOTE — Discharge Summary (Signed)
Physician Discharge Summary   Holly Roberts NWG:[id number] DOB: May 10, 1966 DOA: 06/09/2021  PCP: Oneita Hurt, No  Admit date: 06/09/2021 Discharge date:  06/10/2021  Admitted From: home Disposition:  home Discharging physician: Lewie Chamber, MD  Recommendations for Outpatient Follow-up:  Repeat BMP  Home Health:  Equipment/Devices:   Patient discharged to home in Discharge Condition: stable Risk of unplanned readmission score:    CODE STATUS: Full Diet recommendation:  Diet Orders (From admission, onward)     Start     Ordered   06/10/21 0000  Diet general        06/10/21 1159   06/09/21 1930  DIET SOFT Room service appropriate? Yes; Fluid consistency: Thin  Diet effective now       Question Answer Comment  Room service appropriate? Yes   Fluid consistency: Thin      06/09/21 1929            Hospital Course:   * AKI (acute kidney injury) (HCC) -- Secondary to nausea and vomiting and very poor oral intake (prerenal). -- Anion gap metabolic acidosis probably from loss of bicarb and mild uremia. No h/o alcohol use. -Responded well to IV fluids; creatinine 2.01 at discharge improved from 2.65 and should continue resolving now that oral intake has improved and she can keep up with nutrition at home - Repeat BMP and follow-up as able  Vomiting and diarrhea --resolved per patient, abd benign  High anion gap metabolic acidosis - Resolved with fluids  Chronic pain -- Continue chronic pain medications, verified with patient  Anxiety -- Continue medications  Depression -- Continue medications  Bipolar disorder (HCC) -- Continue chronic medications  Elevated lipase -- No abdominal pain or CT evidence of pancreatitis - Lipase had also improved prior to discharge - No further work-up indicated  Hypoglycemia without diagnosis of diabetes mellitus -- Secondary to poor oral intake.  -Resolved with fluids and as appetite improved during hospitalization   The patient's  chronic medical conditions were treated accordingly per the patient's home medication regimen except as noted.  On day of discharge, patient was felt deemed stable for discharge. Patient/family member advised to call PCP or come back to ER if needed.   Principal Diagnosis: AKI (acute kidney injury) [facility])  Discharge Diagnoses: Active Hospital Problems   Diagnosis Date Noted   AKI (acute kidney injury) (HCC) 06/09/2021    Priority: High   Hypoglycemia without diagnosis of diabetes mellitus 06/09/2021   Elevated lipase 06/09/2021   Bipolar disorder (HCC) 06/09/2021   Depression 06/09/2021   Anxiety 06/09/2021   Chronic pain 06/09/2021   High anion gap metabolic acidosis 06/09/2021   Vomiting and diarrhea 06/09/2021    Resolved Hospital Problems  No resolved problems to display.    Discharge Instructions     Diet general   Complete by: As directed    Increase activity slowly   Complete by: As directed       Allergies as of 06/10/2021       Reactions   Buprenorphine Rash   Patch gave rash- poss adhesive issues. Tried generic and brand name both. Patient states: "I am not allergic to the medication. I am allergic to the adhesive, not the medication."    Amoxicillin-pot Clavulanate    Other reaction(s): Other (See Comments) States "strong" antibiotics cause C diff   Wound Dressing Adhesive    Rash   Sulfa Antibiotics Rash        Medication List     STOP taking these  medications    cholestyramine light 4 g packet Commonly known as: PREVALITE   doxycycline 50 MG capsule Commonly known as: VIBRAMYCIN   famotidine 20 MG tablet Commonly known as: PEPCID   hydrocortisone cream 1 %   ibuprofen 800 MG tablet Commonly known as: ADVIL   loratadine 10 MG tablet Commonly known as: CLARITIN   metoprolol tartrate 25 MG tablet Commonly known as: LOPRESSOR   ondansetron 4 MG disintegrating tablet Commonly known as: ZOFRAN-ODT   traMADol 50 MG tablet Commonly known  as: Ultram       TAKE these medications    Armodafinil 250 MG tablet Take 250 mg by mouth every morning.   diclofenac 50 MG EC tablet Commonly known as: VOLTAREN Take 50 mg by mouth daily.   doxepin 100 MG capsule Commonly known as: SINEQUAN Take 100 mg by mouth at bedtime.   Horizant 600 MG Tbcr Generic drug: Gabapentin Enacarbil Take 600-1,200 mg by mouth in the morning and at bedtime. Take 1 tablet (600 mg) in the morning and Take 2 tablets (1200 mg) at bedtime   Nucynta ER 200 MG Tb12 Generic drug: Tapentadol HCl Take 200 mg by mouth 2 (two) times daily.   Nucynta 100 MG Tabs Generic drug: Tapentadol HCl Take 200 mg by mouth daily in the afternoon.   Trintellix 20 MG Tabs tablet Generic drug: vortioxetine HBr Take 20 mg by mouth daily.   Vraylar 4.5 MG Caps Generic drug: Cariprazine HCl Take 4.5 mg by mouth daily.        Allergies  Allergen Reactions   Buprenorphine Rash    Patch gave rash- poss adhesive issues. Tried generic and brand name both. Patient states: "I am not allergic to the medication. I am allergic to the adhesive, not the medication."    Amoxicillin-Pot Clavulanate     Other reaction(s): Other (See Comments) States "strong" antibiotics cause C diff   Wound Dressing Adhesive     Rash   Sulfa Antibiotics Rash    Consultations:   Discharge Exam: BP (!) 151/72 (BP Location: Right Arm)   Pulse (!) 110   Temp 98 F (36.7 C) (Oral)   Resp 14   Ht 5\' 4"  (1.626 m)   Wt 122 kg   SpO2 92%   BMI 46.17 kg/m  General appearance: alert, cooperative, and no distress Head: Normocephalic, without obvious abnormality, atraumatic Eyes:  EOMI Lungs: clear to auscultation bilaterally Heart: regular rate and rhythm and S1, S2 normal Abdomen: normal findings: bowel sounds normal and soft, non-tender Extremities:  No edema Skin: mobility and turgor normal and no edema Neurologic: Grossly normal  The results of significant diagnostics from this  hospitalization (including imaging, microbiology, ancillary and laboratory) are listed below for reference.   Microbiology: Recent Results (from the past 240 hour(s))  Resp Panel by RT-PCR (Flu A&B, Covid) Nasopharyngeal Swab     Status: None   Collection Time: 06/09/21 12:51 PM   Specimen: Nasopharyngeal Swab; Nasopharyngeal(NP) swabs in vial transport medium  Result Value Ref Range Status   SARS Coronavirus 2 by RT PCR NEGATIVE NEGATIVE Final    Comment: (NOTE) SARS-CoV-2 target nucleic acids are NOT DETECTED.  The SARS-CoV-2 RNA is generally detectable in upper respiratory specimens during the acute phase of infection. The lowest concentration of SARS-CoV-2 viral copies this assay can detect is 138 copies/mL. A negative result does not preclude SARS-Cov-2 infection and should not be used as the sole basis for treatment or other patient management decisions. A negative  result may occur with  improper specimen collection/handling, submission of specimen other than nasopharyngeal swab, presence of viral mutation(s) within the areas targeted by this assay, and inadequate number of viral copies(<138 copies/mL). A negative result must be combined with clinical observations, patient history, and epidemiological information. The expected result is Negative.  Fact Sheet for Patients:  BloggerCourse.com  Fact Sheet for Healthcare Providers:  SeriousBroker.it  This test is no t yet approved or cleared by the [place] FDA and  has been authorized for detection and/or diagnosis of SARS-CoV-2 by FDA under an Emergency Use Authorization (EUA). This EUA will remain  in effect (meaning this test can be used) for the duration of the COVID-19 declaration under Section 564(b)(1) of the Act, 21 U.S.C.section 360bbb-3(b)(1), unless the authorization is terminated  or revoked sooner.       Influenza A by PCR NEGATIVE NEGATIVE Final    Influenza B by PCR NEGATIVE NEGATIVE Final    Comment: (NOTE) The Xpert Xpress SARS-CoV-2/FLU/RSV plus assay is intended as an aid in the diagnosis of influenza from Nasopharyngeal swab specimens and should not be used as a sole basis for treatment. Nasal washings and aspirates are unacceptable for Xpert Xpress SARS-CoV-2/FLU/RSV testing.  Fact Sheet for Patients: BloggerCourse.com  Fact Sheet for Healthcare Providers: SeriousBroker.it  This test is not yet approved or cleared by the [place] FDA and has been authorized for detection and/or diagnosis of SARS-CoV-2 by FDA under an Emergency Use Authorization (EUA). This EUA will remain in effect (meaning this test can be used) for the duration of the COVID-19 declaration under Section 564(b)(1) of the Act, 21 U.S.C. section 360bbb-3(b)(1), unless the authorization is terminated or revoked.  Performed at [facility], [address] [place], [place] [address]      Labs: BNP (last 3 results) No results for input(s): BNP in the last 8760 hours. Basic Metabolic Panel: Recent Labs  Lab 06/09/21 1231 06/10/21 0500  NA 137 136  K 4.2 3.7  CL 99 106  CO2 12* 20*  GLUCOSE 66* 111*  BUN 45* 38*  CREATININE 2.65* 2.01*  CALCIUM 8.4* 8.3*   Liver Function Tests: Recent Labs  Lab 06/09/21 1231 06/10/21 0500  AST 29 20  ALT 19 19  ALKPHOS 129* 112  BILITOT 2.2* 0.7  PROT 7.8 6.9  ALBUMIN 4.7 4.2   Recent Labs  Lab 06/09/21 1231 06/10/21 0500  LIPASE 263* 144*   No results for input(s): AMMONIA in the last 168 hours. CBC: Recent Labs  Lab 06/09/21 1231 06/10/21 0500  WBC 9.1 5.0  NEUTROABS 7.3  --   HGB 13.2 13.4  HCT 42.6 42.2  MCV 93.6 90.6  PLT 266 173   Cardiac Enzymes: No results for input(s): CKTOTAL, CKMB, CKMBINDEX, TROPONINI in the last 168 hours. BNP: Invalid input(s): POCBNP CBG: Recent Labs  Lab 06/09/21 1439  06/09/21 1503  GLUCAP 64* 72   D-Dimer No results for input(s): DDIMER in the last 72 hours. Hgb A1c No results for input(s): HGBA1C in the last 72 hours. Lipid Profile No results for input(s): CHOL, HDL, LDLCALC, TRIG, CHOLHDL, LDLDIRECT in the last 72 hours. Thyroid function studies No results for input(s): TSH, T4TOTAL, T3FREE, THYROIDAB in the last 72 hours.  Invalid input(s): FREET3 Anemia work up No results for input(s): VITAMINB12, FOLATE, FERRITIN, TIBC, IRON, RETICCTPCT in the last 72 hours. Urinalysis    Component Value Date/Time   COLORURINE YELLOW 06/10/2021 0222   APPEARANCEUR HAZY (A) 06/10/2021 0222  LABSPEC 1.014 06/10/2021 0222   PHURINE 5.0 06/10/2021 0222   GLUCOSEU NEGATIVE 06/10/2021 0222   HGBUR SMALL (A) 06/10/2021 0222   BILIRUBINUR NEGATIVE 06/10/2021 0222   KETONESUR 20 (A) 06/10/2021 0222   PROTEINUR 30 (A) 06/10/2021 0222   NITRITE NEGATIVE 06/10/2021 0222   LEUKOCYTESUR LARGE (A) 06/10/2021 0222   Sepsis Labs Invalid input(s): PROCALCITONIN,  WBC,  LACTICIDVEN Microbiology Recent Results (from the past 240 hour(s))  Resp Panel by RT-PCR (Flu A&B, Covid) Nasopharyngeal Swab     Status: None   Collection Time: 06/09/21 12:51 PM   Specimen: Nasopharyngeal Swab; Nasopharyngeal(NP) swabs in vial transport medium  Result Value Ref Range Status   SARS Coronavirus 2 by RT PCR NEGATIVE NEGATIVE Final    Comment: (NOTE) SARS-CoV-2 target nucleic acids are NOT DETECTED.  The SARS-CoV-2 RNA is generally detectable in upper respiratory specimens during the acute phase of infection. The lowest concentration of SARS-CoV-2 viral copies this assay can detect is 138 copies/mL. A negative result does not preclude SARS-Cov-2 infection and should not be used as the sole basis for treatment or other patient management decisions. A negative result may occur with  improper specimen collection/handling, submission of specimen other than nasopharyngeal swab,  presence of viral mutation(s) within the areas targeted by this assay, and inadequate number of viral copies(<138 copies/mL). A negative result must be combined with clinical observations, patient history, and epidemiological information. The expected result is Negative.  Fact Sheet for Patients:  BloggerCourse.com  Fact Sheet for Healthcare Providers:  SeriousBroker.it  This test is no t yet approved or cleared by the [place] FDA and  has been authorized for detection and/or diagnosis of SARS-CoV-2 by FDA under an Emergency Use Authorization (EUA). This EUA will remain  in effect (meaning this test can be used) for the duration of the COVID-19 declaration under Section 564(b)(1) of the Act, 21 U.S.C.section 360bbb-3(b)(1), unless the authorization is terminated  or revoked sooner.       Influenza A by PCR NEGATIVE NEGATIVE Final   Influenza B by PCR NEGATIVE NEGATIVE Final    Comment: (NOTE) The Xpert Xpress SARS-CoV-2/FLU/RSV plus assay is intended as an aid in the diagnosis of influenza from Nasopharyngeal swab specimens and should not be used as a sole basis for treatment. Nasal washings and aspirates are unacceptable for Xpert Xpress SARS-CoV-2/FLU/RSV testing.  Fact Sheet for Patients: BloggerCourse.com  Fact Sheet for Healthcare Providers: SeriousBroker.it  This test is not yet approved or cleared by the [place] FDA and has been authorized for detection and/or diagnosis of SARS-CoV-2 by FDA under an Emergency Use Authorization (EUA). This EUA will remain in effect (meaning this test can be used) for the duration of the COVID-19 declaration under Section 564(b)(1) of the Act, 21 U.S.C. section 360bbb-3(b)(1), unless the authorization is terminated or revoked.  Performed at [facility], [address], [place], [place] [address]      Procedures/Studies: CT ABDOMEN PELVIS WO CONTRAST  Result Date: 06/09/2021 CLINICAL DATA:  Nausea, vomiting, and diarrhea. EXAM: CT ABDOMEN AND PELVIS WITHOUT CONTRAST TECHNIQUE: Multidetector CT imaging of the abdomen and pelvis was performed following the standard protocol without IV contrast. COMPARISON:  CT abdomen pelvis dated Apr 19, 2021. FINDINGS: Lower chest: No acute abnormality. Hepatobiliary: Diffusely decreased liver density. No focal abnormality. Interval cholecystectomy. No biliary dilatation. Pancreas: Unremarkable. No pancreatic ductal dilatation or surrounding inflammatory changes. Spleen: Normal in size without focal abnormality. Adrenals/Urinary Tract: Adrenal glands are unremarkable. Unchanged 9 mm  angiomyolipoma in the lower pole of the right kidney. No renal calculi or hydronephrosis. The bladder is unremarkable. Stomach/Bowel: Stomach is within normal limits. No evidence of bowel wall thickening, distention, or inflammatory changes. Vascular/Lymphatic: No significant vascular findings are present. No enlarged abdominal or pelvic lymph nodes. Reproductive: Uterus and bilateral adnexa are unremarkable. Other: No abdominal wall hernia or abnormality. No abdominopelvic ascites. No pneumoperitoneum. Musculoskeletal: No acute or significant osseous findings. 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 IMPRESSION: 1. No acute intra-abdominal process. 2. Hepatic steatosis. 3. Unchanged 9 mm right renal angiomyolipoma. Electronically Signed   By: Obie Dredge M.D.   On: 06/09/2021 16:33     Time coordinating discharge: Over 30 minutes    Lewie Chamber, MD  [facility] 06/10/2021, 2:41 PM

## 2021-07-24 ENCOUNTER — Encounter (HOSPITAL_BASED_OUTPATIENT_CLINIC_OR_DEPARTMENT_OTHER): Payer: Self-pay | Admitting: *Deleted

## 2021-07-24 ENCOUNTER — Other Ambulatory Visit: Payer: Self-pay

## 2021-07-24 ENCOUNTER — Emergency Department (HOSPITAL_BASED_OUTPATIENT_CLINIC_OR_DEPARTMENT_OTHER)
Admission: EM | Admit: 2021-07-24 | Discharge: 2021-07-24 | Disposition: A | Discharge status: Home / Self Care | Payer: BC Managed Care – PPO | Attending: Emergency Medicine | Admitting: Emergency Medicine

## 2021-07-24 ENCOUNTER — Emergency Department (HOSPITAL_BASED_OUTPATIENT_CLINIC_OR_DEPARTMENT_OTHER): Payer: BC Managed Care – PPO

## 2021-07-24 DIAGNOSIS — R112 Nausea with vomiting, unspecified: Secondary | ICD-10-CM | POA: Insufficient documentation

## 2021-07-24 DIAGNOSIS — R1084 Generalized abdominal pain: Secondary | ICD-10-CM | POA: Insufficient documentation

## 2021-07-24 DIAGNOSIS — R197 Diarrhea, unspecified: Secondary | ICD-10-CM | POA: Diagnosis not present

## 2021-07-24 LAB — COMPREHENSIVE METABOLIC PANEL
ALT: 18 U/L (ref 0–44)
AST: 20 U/L (ref 15–41)
Albumin: 4.7 g/dL (ref 3.5–5.0)
Alkaline Phosphatase: 125 U/L (ref 38–126)
Anion gap: 16 — ABNORMAL HIGH (ref 5–15)
BUN: 12 mg/dL (ref 6–20)
CO2: 27 mmol/L (ref 22–32)
Calcium: 10.1 mg/dL (ref 8.9–10.3)
Chloride: 98 mmol/L (ref 98–111)
Creatinine, Ser: 0.71 mg/dL (ref 0.44–1.00)
GFR, Estimated: >60 mL/min (ref >60)
Glucose, Bld: 86 mg/dL (ref 70–99)
Potassium: 4.1 mmol/L (ref 3.5–5.1)
Sodium: 141 mmol/L (ref 135–145)
Total Bilirubin: 0.7 mg/dL (ref 0.3–1.2)
Total Protein: 8 g/dL (ref 6.5–8.1)

## 2021-07-24 LAB — CBC WITH DIFFERENTIAL/PLATELET
Abs Immature Granulocytes: 0.03 10*3/uL (ref 0.00–0.07)
Basophils Absolute: 0 10*3/uL (ref 0.0–0.1)
Basophils Relative: 0 %
Eosinophils Absolute: 0 10*3/uL (ref 0.0–0.5)
Eosinophils Relative: 0 %
HCT: 40.6 % (ref 36.0–46.0)
Hemoglobin: 13.4 g/dL (ref 12.0–15.0)
Immature Granulocytes: 0 %
Lymphocytes Relative: 8 %
Lymphs Abs: 0.5 10*3/uL — ABNORMAL LOW (ref 0.7–4.0)
MCH: 29 pg (ref 26.0–34.0)
MCHC: 33 g/dL (ref 30.0–36.0)
MCV: 87.9 fL (ref 80.0–100.0)
Monocytes Absolute: 0.3 10*3/uL (ref 0.1–1.0)
Monocytes Relative: 4 %
Neutro Abs: 6.1 10*3/uL (ref 1.7–7.7)
Neutrophils Relative %: 88 %
Platelets: 266 10*3/uL (ref 150–400)
RBC: 4.62 MIL/uL (ref 3.87–5.11)
RDW: 14.1 % (ref 11.5–15.5)
WBC: 6.9 10*3/uL (ref 4.0–10.5)
nRBC: 0 % (ref 0.0–0.2)

## 2021-07-24 LAB — LIPASE, BLOOD: Lipase: 59 U/L — ABNORMAL HIGH (ref 11–51)

## 2021-07-24 MED ORDER — SODIUM CHLORIDE 0.9 % IV BOLUS
1000.0000 mL | Freq: Once | INTRAVENOUS | Status: AC
  Administered 2021-07-24: 1000 mL via INTRAVENOUS

## 2021-07-24 MED ORDER — ONDANSETRON HCL 4 MG PO TABS
4.0000 mg | ORAL_TABLET | Freq: Once | ORAL | Status: AC
  Administered 2021-07-24: 4 mg via ORAL
  Filled 2021-07-24: qty 1

## 2021-07-24 MED ORDER — HALOPERIDOL LACTATE 5 MG/ML IJ SOLN
2.0000 mg | Freq: Once | INTRAMUSCULAR | Status: AC
  Administered 2021-07-24: 2 mg via INTRAVENOUS
  Filled 2021-07-24: qty 1

## 2021-07-24 MED ORDER — ONDANSETRON HCL 4 MG/2ML IJ SOLN
4.0000 mg | Freq: Once | INTRAMUSCULAR | Status: AC
  Administered 2021-07-24: 4 mg via INTRAVENOUS
  Filled 2021-07-24: qty 2

## 2021-07-24 MED ORDER — ONDANSETRON HCL 4 MG PO TABS: 4.0000 mg | ORAL_TABLET | Freq: Four times a day (QID) | ORAL | 0 refills | Status: DC

## 2021-07-24 NOTE — ED Notes (Signed)
Reviewed and explained discahrge instructions. Pt verbalized understanding.

## 2021-07-24 NOTE — ED Notes (Signed)
Pt is aware we need a urine specimen, warm blanket given.

## 2021-07-24 NOTE — ED Triage Notes (Signed)
Abdominal pain, diarrhea and emesis since yesterday morning.

## 2021-07-24 NOTE — ED Notes (Signed)
Pt has all her personal belongings with her including pants in personal belongings bag upon leaving facility.

## 2021-07-24 NOTE — ED Notes (Signed)
Called into room need to go to [facility]. Assisted up to [facility]. With no problems. Had liquid light brown stool. Return to bed. Call light within reach.

## 2021-07-24 NOTE — ED Provider Notes (Signed)
[facility] EMERGENCY DEPT Provider Note   CSN: [id number] Arrival date & time:        History Chief Complaint  Patient presents with   Emesis   Abdominal Pain    Holly Roberts is a 55 y.o. female.  The history is provided by the patient.  Abdominal Pain Pain location:  Generalized Pain quality: aching and bloating   Pain radiates to:  Does not radiate Pain severity:  Mild Onset quality:  Gradual Timing:  Constant Progression:  Unchanged Chronicity:  Recurrent Context: previous surgery (n/v/d intermittently since GB surgery. Symptoms back again)   Relieved by:  Nothing Worsened by:  Nothing Associated symptoms: diarrhea, nausea and vomiting   Associated symptoms: no anorexia, no belching, no chest pain, no chills, no cough, no dysuria, no fever, no hematuria, no shortness of breath and no sore throat       Past Medical History:  Diagnosis Date   Anxiety    Bipolar disorder (HCC)    Chronic pain    Depression    Hypertension    patient denies    Patient Active Problem List   Diagnosis Date Noted   AKI (acute kidney injury) (HCC) 06/09/2021   Hypoglycemia without diagnosis of diabetes mellitus 06/09/2021   Elevated lipase 06/09/2021   Bipolar disorder (HCC) 06/09/2021   Depression 06/09/2021   Anxiety 06/09/2021   Chronic pain 06/09/2021   High anion gap metabolic acidosis 06/09/2021   Vomiting and diarrhea 06/09/2021   Cholecystitis with cholelithiasis 04/19/2021    Past Surgical History:  Procedure Laterality Date   CHOLECYSTECTOMY N/A 04/20/2021   Procedure: LAPAROSCOPIC CHOLECYSTECTOMY WITH INTRAOPERATIVE CHOLANGIOGRAM;  Surgeon: [name], MD;  Location: [facility];  Service: General;  Laterality: N/A;   Right knee surgery     TONSILLECTOMY       OB History     Gravida  3   Para  2   Term      Preterm      AB  1   Living         SAB      IAB      Ectopic      Multiple      Live Births               Family History  Problem Relation Age of Onset   Cancer Father     Social History   Tobacco Use   Smoking status: Never   Smokeless tobacco: Never  Vaping Use   Vaping Use: Never used  Substance Use Topics   Alcohol use: Yes    Comment: rarely   Drug use: Never    Home Medications Prior to Admission medications   Medication Sig Start Date End Date Taking? Authorizing Provider  Armodafinil 250 MG tablet Take 250 mg by mouth every morning. 04/09/21  Yes [provider]  diclofenac (VOLTAREN) 50 MG EC tablet Take 50 mg by mouth daily. 02/08/21  Yes [provider]  NUCYNTA 100 MG TABS Take 200 mg by mouth daily in the afternoon. 03/27/21  Yes [provider]  NUCYNTA ER 200 MG TB12 Take 200 mg by mouth 2 (two) times daily. 03/27/21  Yes [provider]  ondansetron (ZOFRAN) 4 MG tablet Take 1 tablet (4 mg total) by mouth every 6 (six) hours for 20 doses. 07/24/21 07/29/21 Yes Juliani Laduke, DO  TRINTELLIX 20 MG TABS tablet Take 20 mg by mouth daily. 06/08/21  Yes [provider]  Leafy Kindle  4.5 MG CAPS Take 4.5 mg by mouth daily. 04/06/21  Yes [provider]  doxepin (SINEQUAN) 100 MG capsule Take 100 mg by mouth at bedtime. 06/08/21   [provider]  HORIZANT 600 MG TBCR Take 600-1,200 mg by mouth in the morning and at bedtime. Take 1 tablet (600 mg) in the morning and Take 2 tablets (1200 mg) at bedtime 05/29/21   [provider]    Allergies    Buprenorphine, Amoxicillin-pot clavulanate, Wound dressing adhesive, and Sulfa antibiotics  Review of Systems   Review of Systems  Constitutional:  Negative for chills and fever.  HENT:  Negative for ear pain and sore throat.   Eyes:  Negative for pain and visual disturbance.  Respiratory:  Negative for cough and shortness of breath.   Cardiovascular:  Negative for chest pain and palpitations.  Gastrointestinal:  Positive for abdominal pain, diarrhea, nausea and  vomiting. Negative for anorexia.  Genitourinary:  Negative for dysuria and hematuria.  Musculoskeletal:  Negative for arthralgias and back pain.  Skin:  Negative for color change and rash.  Neurological:  Negative for seizures and syncope.  All other systems reviewed and are negative.  Physical Exam Updated Vital Signs BP 138/86   Pulse 95   Temp 98.2 F (36.8 C) (Oral)   Resp 16   Ht 5\' 4"  (1.626 m)   Wt 122 kg   SpO2 93%   BMI 46.17 kg/m   Physical Exam Vitals and nursing note reviewed.  Constitutional:      General: She is not in acute distress.    Appearance: She is well-developed.  HENT:     Head: Normocephalic and atraumatic.     Mouth/Throat:     Mouth: Mucous membranes are moist.  Eyes:     Extraocular Movements: Extraocular movements intact.     Conjunctiva/sclera: Conjunctivae normal.     Pupils: Pupils are equal, round, and reactive to light.  Cardiovascular:     Rate and Rhythm: Normal rate and regular rhythm.     Heart sounds: Normal heart sounds. No murmur heard. Pulmonary:     Effort: Pulmonary effort is normal. No respiratory distress.     Breath sounds: Normal breath sounds.  Abdominal:     General: Abdomen is flat. There is no distension.     Palpations: Abdomen is soft.     Tenderness: There is generalized abdominal tenderness. There is no guarding or rebound.  Musculoskeletal:     Cervical back: Neck supple.  Skin:    General: Skin is warm and dry.     Capillary Refill: Capillary refill takes less than 2 seconds.  Neurological:     General: No focal deficit present.     Mental Status: She is alert.  Psychiatric:        Mood and Affect: Mood normal.    ED Results / Procedures / Treatments   Labs (all labs ordered are listed, but only abnormal results are displayed) Labs Reviewed  CBC WITH DIFFERENTIAL/PLATELET - Abnormal; Notable for the following components:      Result Value   Lymphs Abs 0.5 (*)    All other components within normal  limits  COMPREHENSIVE METABOLIC PANEL - Abnormal; Notable for the following components:   Anion gap 16 (*)    All other components within normal limits  LIPASE, BLOOD - Abnormal; Notable for the following components:   Lipase 59 (*)    All other components within normal limits    EKG None  Radiology CT ABDOMEN PELVIS WO CONTRAST  Result Date: 07/24/2021 CLINICAL DATA:  55 year old female with abdominal pain, diarrhea and vomiting since yesterday. Recent renal insufficiency. EXAM: CT ABDOMEN AND PELVIS WITHOUT CONTRAST TECHNIQUE: Multidetector CT imaging of the abdomen and pelvis was performed following the standard protocol without IV contrast. COMPARISON:  CT Abdomen and Pelvis 06/09/2021, 04/19/2021. FINDINGS: Lower chest: Negative.  No pericardial or pleural effusion. Hepatobiliary: Absent gallbladder.  Negative noncontrast liver. Pancreas: Negative. Spleen: Negative. Adrenals/Urinary Tract: Adrenal glands remain within normal limits. 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 No nephrolithiasis, hydronephrosis, or acute renal finding. Negative ureters, bladder. Stomach/Bowel: The cecum is on a lax mesentery located in the right upper abdomen. Appendix appears to be diminutive or absent. Decompressed terminal ileum. No dilated small bowel. Stomach and duodenum appear within normal limits. No convincing bowel inflammation. No free air or free fluid. Vascular/Lymphatic: Normal caliber abdominal aorta. Minimal calcified atherosclerosis. No lymphadenopathy. Reproductive: Negative noncontrast appearance. Other: No pelvic free fluid. Musculoskeletal: Grade 2 versus grade 3 spondylolisthesis at the lumbosacral junction with chronic L5 pars fractures and spinal stenosis appears stable. No acute osseous abnormality identified. IMPRESSION: 1. No acute or inflammatory process identified in the non-contrast abdomen or pelvis. 2. 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 Electronically Signed   By: Odessa FlemingH  Hall M.D.   On: 07/24/2021 10:07    Procedures Procedures   Medications Ordered in ED Medications  sodium chloride 0.9 % bolus 1,000 mL (1,000 mLs Intravenous New Bag/Given 07/24/21 1014)  ondansetron (ZOFRAN) injection 4 mg (4 mg Intravenous Given 07/24/21 0952)  haloperidol lactate (HALDOL) injection 2 mg (2 mg Intravenous Given 07/24/21 1017)    ED Course  I have reviewed the triage vital signs and the nursing notes.  Pertinent labs & imaging results that were available during my care of the patient were reviewed by me and considered in my medical decision making (see chart for details).    MDM Rules/Calculators/A&P                           Holly Roberts is here with nausea, vomiting, diarrhea, abdominal pain.  Normal vitals.  No fever.  Denies any suspicious food intake.  No fever.  States that intermittently this is happened since having her gallbladder removed several months ago.  Having pain all over her abdomen.  Feels very nauseous and has vomited multiple times.  Has had loose stools.  Denies any bloody stools or bloody vomit.  Overall will evaluate for pancreatitis, bowel obstruction, UTI.  Overall suspect may be acute on chronic process of viral process or foodborne illness.  Will give IV fluids, IV Zofran and reevaluate.  Lab work showed no significant Electra abnormality, kidney injury, leukocytosis.  CT scan negative for any acute process.  Overall suspect viral foodborne process.  Discharged in good condition.  Felt better after IV fluids and IV Zofran.  This chart was dictated using voice recognition software.  Despite best efforts to proofread,  errors can occur which can change the documentation meaning.   Final Clinical Impression(s) / ED Diagnoses Final diagnoses:  Nausea and vomiting, intractability of vomiting not specified, unspecified  vomiting type    Rx / DC Orders ED Discharge Orders          Ordered    ondansetron (ZOFRAN) 4 MG tablet  Every 6  hours        07/24/21 1134             CuratoloMadelaine Bhat, DO 07/24/21 1135

## 2021-07-24 NOTE — ED Notes (Addendum)
Pt had small bowel movement in bed, pt cleaned up and given paper scrub pants.

## 2021-07-29 ENCOUNTER — Encounter (HOSPITAL_COMMUNITY): Payer: Self-pay

## 2021-07-29 ENCOUNTER — Inpatient Hospital Stay (HOSPITAL_COMMUNITY)
Admission: EM | Admit: 2021-07-29 | Discharge: 2021-08-10 | DRG: 640 | Disposition: A | Discharge status: Skilled Nursing Facility | Payer: BC Managed Care – PPO | Attending: Internal Medicine | Admitting: Internal Medicine

## 2021-07-29 ENCOUNTER — Other Ambulatory Visit: Payer: Self-pay

## 2021-07-29 DIAGNOSIS — Z781 Physical restraint status: Secondary | ICD-10-CM

## 2021-07-29 DIAGNOSIS — F32A Depression, unspecified: Secondary | ICD-10-CM | POA: Diagnosis present

## 2021-07-29 DIAGNOSIS — Z0189 Encounter for other specified special examinations: Secondary | ICD-10-CM

## 2021-07-29 DIAGNOSIS — Z20822 Contact with and (suspected) exposure to covid-19: Secondary | ICD-10-CM | POA: Diagnosis present

## 2021-07-29 DIAGNOSIS — F319 Bipolar disorder, unspecified: Secondary | ICD-10-CM | POA: Diagnosis present

## 2021-07-29 DIAGNOSIS — G8929 Other chronic pain: Secondary | ICD-10-CM | POA: Diagnosis present

## 2021-07-29 DIAGNOSIS — E872 Acidosis, unspecified: Secondary | ICD-10-CM

## 2021-07-29 DIAGNOSIS — E440 Moderate protein-calorie malnutrition: Secondary | ICD-10-CM | POA: Diagnosis present

## 2021-07-29 DIAGNOSIS — Z23 Encounter for immunization: Secondary | ICD-10-CM

## 2021-07-29 DIAGNOSIS — F419 Anxiety disorder, unspecified: Secondary | ICD-10-CM | POA: Diagnosis present

## 2021-07-29 DIAGNOSIS — K9089 Other intestinal malabsorption: Secondary | ICD-10-CM | POA: Diagnosis present

## 2021-07-29 DIAGNOSIS — K219 Gastro-esophageal reflux disease without esophagitis: Secondary | ICD-10-CM | POA: Diagnosis present

## 2021-07-29 DIAGNOSIS — E876 Hypokalemia: Secondary | ICD-10-CM | POA: Diagnosis present

## 2021-07-29 DIAGNOSIS — Z6841 Body Mass Index (BMI) 40.0 and over, adult: Secondary | ICD-10-CM

## 2021-07-29 DIAGNOSIS — Z882 Allergy status to sulfonamides status: Secondary | ICD-10-CM

## 2021-07-29 DIAGNOSIS — Z9114 Patient's other noncompliance with medication regimen: Secondary | ICD-10-CM

## 2021-07-29 DIAGNOSIS — M545 Low back pain, unspecified: Secondary | ICD-10-CM | POA: Diagnosis present

## 2021-07-29 DIAGNOSIS — G928 Other toxic encephalopathy: Secondary | ICD-10-CM | POA: Diagnosis present

## 2021-07-29 DIAGNOSIS — R197 Diarrhea, unspecified: Secondary | ICD-10-CM | POA: Diagnosis present

## 2021-07-29 DIAGNOSIS — L89152 Pressure ulcer of sacral region, stage 2: Secondary | ICD-10-CM | POA: Diagnosis present

## 2021-07-29 DIAGNOSIS — Z888 Allergy status to other drugs, medicaments and biological substances status: Secondary | ICD-10-CM

## 2021-07-29 DIAGNOSIS — R1110 Vomiting, unspecified: Secondary | ICD-10-CM | POA: Diagnosis present

## 2021-07-29 DIAGNOSIS — F410 Panic disorder [episodic paroxysmal anxiety] without agoraphobia: Secondary | ICD-10-CM | POA: Diagnosis present

## 2021-07-29 DIAGNOSIS — Z881 Allergy status to other antibiotic agents status: Secondary | ICD-10-CM

## 2021-07-29 DIAGNOSIS — I10 Essential (primary) hypertension: Secondary | ICD-10-CM | POA: Diagnosis present

## 2021-07-29 DIAGNOSIS — E861 Hypovolemia: Secondary | ICD-10-CM | POA: Diagnosis present

## 2021-07-29 DIAGNOSIS — L8990 Pressure ulcer of unspecified site, unspecified stage: Secondary | ICD-10-CM | POA: Diagnosis present

## 2021-07-29 DIAGNOSIS — N179 Acute kidney failure, unspecified: Secondary | ICD-10-CM | POA: Diagnosis present

## 2021-07-29 DIAGNOSIS — G9340 Encephalopathy, unspecified: Secondary | ICD-10-CM | POA: Diagnosis present

## 2021-07-29 DIAGNOSIS — G894 Chronic pain syndrome: Secondary | ICD-10-CM | POA: Diagnosis present

## 2021-07-29 DIAGNOSIS — E860 Dehydration: Secondary | ICD-10-CM | POA: Diagnosis present

## 2021-07-29 NOTE — ED Notes (Signed)
Light, Drk Green & Purple drawn, Drk green holding in mini lab, All other tubes sent to lab, will notify lab to process when orders arrive.

## 2021-07-29 NOTE — ED Triage Notes (Signed)
Pt complains of nausea and not being able to eat or drink since today. Pt had gallbladder surgery 3 months ago and has been noncompliant with her diet.   Vitals 152/96 114 hr  26 resp 99% room air  99 cbg  98.68F

## 2021-07-30 ENCOUNTER — Emergency Department (HOSPITAL_COMMUNITY): Payer: BC Managed Care – PPO

## 2021-07-30 ENCOUNTER — Encounter (HOSPITAL_COMMUNITY): Payer: Self-pay

## 2021-07-30 ENCOUNTER — Inpatient Hospital Stay (HOSPITAL_COMMUNITY): Payer: BC Managed Care – PPO

## 2021-07-30 DIAGNOSIS — E872 Acidosis: Secondary | ICD-10-CM | POA: Diagnosis present

## 2021-07-30 DIAGNOSIS — Z881 Allergy status to other antibiotic agents status: Secondary | ICD-10-CM | POA: Diagnosis not present

## 2021-07-30 DIAGNOSIS — Z888 Allergy status to other drugs, medicaments and biological substances status: Secondary | ICD-10-CM | POA: Diagnosis not present

## 2021-07-30 DIAGNOSIS — G9340 Encephalopathy, unspecified: Secondary | ICD-10-CM

## 2021-07-30 DIAGNOSIS — Z23 Encounter for immunization: Secondary | ICD-10-CM | POA: Diagnosis not present

## 2021-07-30 DIAGNOSIS — Z6841 Body Mass Index (BMI) 40.0 and over, adult: Secondary | ICD-10-CM | POA: Diagnosis not present

## 2021-07-30 DIAGNOSIS — Z9114 Patient's other noncompliance with medication regimen: Secondary | ICD-10-CM | POA: Diagnosis not present

## 2021-07-30 DIAGNOSIS — F419 Anxiety disorder, unspecified: Secondary | ICD-10-CM | POA: Diagnosis present

## 2021-07-30 DIAGNOSIS — E876 Hypokalemia: Secondary | ICD-10-CM | POA: Diagnosis present

## 2021-07-30 DIAGNOSIS — E860 Dehydration: Secondary | ICD-10-CM | POA: Diagnosis present

## 2021-07-30 DIAGNOSIS — I10 Essential (primary) hypertension: Secondary | ICD-10-CM | POA: Diagnosis present

## 2021-07-30 DIAGNOSIS — E440 Moderate protein-calorie malnutrition: Secondary | ICD-10-CM | POA: Diagnosis present

## 2021-07-30 DIAGNOSIS — Z882 Allergy status to sulfonamides status: Secondary | ICD-10-CM | POA: Diagnosis not present

## 2021-07-30 DIAGNOSIS — G894 Chronic pain syndrome: Secondary | ICD-10-CM | POA: Diagnosis present

## 2021-07-30 DIAGNOSIS — F319 Bipolar disorder, unspecified: Secondary | ICD-10-CM | POA: Diagnosis present

## 2021-07-30 DIAGNOSIS — K219 Gastro-esophageal reflux disease without esophagitis: Secondary | ICD-10-CM | POA: Diagnosis present

## 2021-07-30 DIAGNOSIS — E861 Hypovolemia: Secondary | ICD-10-CM | POA: Diagnosis present

## 2021-07-30 DIAGNOSIS — N179 Acute kidney failure, unspecified: Secondary | ICD-10-CM | POA: Diagnosis present

## 2021-07-30 DIAGNOSIS — Z20822 Contact with and (suspected) exposure to covid-19: Secondary | ICD-10-CM | POA: Diagnosis present

## 2021-07-30 DIAGNOSIS — Z781 Physical restraint status: Secondary | ICD-10-CM | POA: Diagnosis not present

## 2021-07-30 DIAGNOSIS — G928 Other toxic encephalopathy: Secondary | ICD-10-CM | POA: Diagnosis present

## 2021-07-30 DIAGNOSIS — L89152 Pressure ulcer of sacral region, stage 2: Secondary | ICD-10-CM | POA: Diagnosis present

## 2021-07-30 LAB — CBC WITH DIFFERENTIAL/PLATELET
Abs Immature Granulocytes: 0.31 10*3/uL — ABNORMAL HIGH (ref 0.00–0.07)
Abs Immature Granulocytes: 0.17 10*3/uL — ABNORMAL HIGH (ref 0.00–0.07)
Basophils Absolute: 0.1 10*3/uL (ref 0.0–0.1)
Basophils Absolute: 0 10*3/uL (ref 0.0–0.1)
Basophils Relative: 0 %
Basophils Relative: 0 %
Eosinophils Absolute: 0.1 10*3/uL (ref 0.0–0.5)
Eosinophils Absolute: 0 10*3/uL (ref 0.0–0.5)
Eosinophils Relative: 0 %
Eosinophils Relative: 0 %
HCT: 46.1 % — ABNORMAL HIGH (ref 36.0–46.0)
HCT: 40.5 % (ref 36.0–46.0)
Hemoglobin: 14.8 g/dL (ref 12.0–15.0)
Hemoglobin: 12.5 g/dL (ref 12.0–15.0)
Immature Granulocytes: 2 %
Immature Granulocytes: 1 %
Lymphocytes Relative: 3 %
Lymphocytes Relative: 3 %
Lymphs Abs: 0.6 10*3/uL — ABNORMAL LOW (ref 0.7–4.0)
Lymphs Abs: 0.6 10*3/uL — ABNORMAL LOW (ref 0.7–4.0)
MCH: 30.5 pg (ref 26.0–34.0)
MCH: 29.1 pg (ref 26.0–34.0)
MCHC: 32.1 g/dL (ref 30.0–36.0)
MCHC: 30.9 g/dL (ref 30.0–36.0)
MCV: 94.9 fL (ref 80.0–100.0)
MCV: 94.4 fL (ref 80.0–100.0)
Monocytes Absolute: 1.1 10*3/uL — ABNORMAL HIGH (ref 0.1–1.0)
Monocytes Absolute: 1.2 10*3/uL — ABNORMAL HIGH (ref 0.1–1.0)
Monocytes Relative: 6 %
Monocytes Relative: 7 %
Neutro Abs: 18 10*3/uL — ABNORMAL HIGH (ref 1.7–7.7)
Neutro Abs: 16.8 10*3/uL — ABNORMAL HIGH (ref 1.7–7.7)
Neutrophils Relative %: 89 %
Neutrophils Relative %: 89 %
Platelets: 374 10*3/uL (ref 150–400)
Platelets: 253 10*3/uL (ref 150–400)
RBC: 4.86 MIL/uL (ref 3.87–5.11)
RBC: 4.29 MIL/uL (ref 3.87–5.11)
RDW: 15.3 % (ref 11.5–15.5)
RDW: 15.6 % — ABNORMAL HIGH (ref 11.5–15.5)
WBC: 20.1 10*3/uL — ABNORMAL HIGH (ref 4.0–10.5)
WBC: 18.7 10*3/uL — ABNORMAL HIGH (ref 4.0–10.5)
nRBC: 0 % (ref 0.0–0.2)
nRBC: 0.1 % (ref 0.0–0.2)

## 2021-07-30 LAB — URINALYSIS, MICROSCOPIC (REFLEX): RBC / HPF: >50 RBC/hpf (ref 0–5)

## 2021-07-30 LAB — URINALYSIS, ROUTINE W REFLEX MICROSCOPIC
Bilirubin Urine: NEGATIVE
Glucose, UA: NEGATIVE mg/dL
Glucose, UA: NEGATIVE mg/dL
Ketones, ur: >80 mg/dL — AB
Ketones, ur: >80 mg/dL — AB
Leukocytes,Ua: NEGATIVE
Leukocytes,Ua: NEGATIVE
Nitrite: NEGATIVE
Nitrite: NEGATIVE
Protein, ur: 100 mg/dL — AB
Specific Gravity, Urine: >1.03 — ABNORMAL HIGH (ref 1.005–1.030)
Specific Gravity, Urine: 1.02 (ref 1.005–1.030)
pH: 6 (ref 5.0–8.0)
pH: 6 (ref 5.0–8.0)

## 2021-07-30 LAB — BASIC METABOLIC PANEL
Anion gap: 22 — ABNORMAL HIGH (ref 5–15)
BUN: 14 mg/dL (ref 6–20)
CO2: 11 mmol/L — ABNORMAL LOW (ref 22–32)
Calcium: 9.5 mg/dL (ref 8.9–10.3)
Chloride: 114 mmol/L — ABNORMAL HIGH (ref 98–111)
Creatinine, Ser: 0.98 mg/dL (ref 0.44–1.00)
GFR, Estimated: >60 mL/min (ref >60)
Glucose, Bld: 100 mg/dL — ABNORMAL HIGH (ref 70–99)
Potassium: 3.4 mmol/L — ABNORMAL LOW (ref 3.5–5.1)
Sodium: 147 mmol/L — ABNORMAL HIGH (ref 135–145)

## 2021-07-30 LAB — COMPREHENSIVE METABOLIC PANEL
ALT: 27 U/L (ref 0–44)
ALT: 22 U/L (ref 0–44)
AST: 23 U/L (ref 15–41)
AST: 19 U/L (ref 15–41)
Albumin: 5.5 g/dL — ABNORMAL HIGH (ref 3.5–5.0)
Albumin: 4.9 g/dL (ref 3.5–5.0)
Alkaline Phosphatase: 141 U/L — ABNORMAL HIGH (ref 38–126)
Alkaline Phosphatase: 128 U/L — ABNORMAL HIGH (ref 38–126)
Anion gap: 28 — ABNORMAL HIGH (ref 5–15)
Anion gap: >20 — ABNORMAL HIGH (ref 5–15)
BUN: 20 mg/dL (ref 6–20)
BUN: 20 mg/dL (ref 6–20)
CO2: 7 mmol/L — ABNORMAL LOW (ref 22–32)
CO2: 9 mmol/L — ABNORMAL LOW (ref 22–32)
Calcium: 10.4 mg/dL — ABNORMAL HIGH (ref 8.9–10.3)
Calcium: 9.8 mg/dL (ref 8.9–10.3)
Chloride: 105 mmol/L (ref 98–111)
Chloride: 105 mmol/L (ref 98–111)
Creatinine, Ser: 1.26 mg/dL — ABNORMAL HIGH (ref 0.44–1.00)
Creatinine, Ser: 1.1 mg/dL — ABNORMAL HIGH (ref 0.44–1.00)
GFR, Estimated: 50 mL/min — ABNORMAL LOW (ref >60)
GFR, Estimated: 59 mL/min — ABNORMAL LOW (ref >60)
Glucose, Bld: 116 mg/dL — ABNORMAL HIGH (ref 70–99)
Glucose, Bld: 112 mg/dL — ABNORMAL HIGH (ref 70–99)
Potassium: 3.5 mmol/L (ref 3.5–5.1)
Potassium: 3.6 mmol/L (ref 3.5–5.1)
Sodium: 140 mmol/L (ref 135–145)
Sodium: 143 mmol/L (ref 135–145)
Total Bilirubin: 1.8 mg/dL — ABNORMAL HIGH (ref 0.3–1.2)
Total Bilirubin: 2 mg/dL — ABNORMAL HIGH (ref 0.3–1.2)
Total Protein: 9.1 g/dL — ABNORMAL HIGH (ref 6.5–8.1)
Total Protein: 8 g/dL (ref 6.5–8.1)

## 2021-07-30 LAB — BLOOD GAS, VENOUS
Acid-base deficit: 23.9 mmol/L — ABNORMAL HIGH (ref 0.0–2.0)
Bicarbonate: 5.5 mmol/L — ABNORMAL LOW (ref 20.0–28.0)
O2 Saturation: 71.5 %
Patient temperature: 98.6
pCO2, Ven: 19.3 mmHg — CL (ref 44.0–60.0)
pH, Ven: 7.087 — CL (ref 7.250–7.430)
pO2, Ven: 47.9 mmHg — ABNORMAL HIGH (ref 32.0–45.0)

## 2021-07-30 LAB — BLOOD GAS, ARTERIAL
Acid-base deficit: 17.7 mmol/L — ABNORMAL HIGH (ref 0.0–2.0)
Acid-base deficit: 16.8 mmol/L — ABNORMAL HIGH (ref 0.0–2.0)
Bicarbonate: 9 mmol/L — ABNORMAL LOW (ref 20.0–28.0)
Bicarbonate: 9 mmol/L — ABNORMAL LOW (ref 20.0–28.0)
Drawn by: 331471
Drawn by: 331471
FIO2: 28
FIO2: 21
O2 Content: 2 L/min
O2 Saturation: 75.1 %
O2 Saturation: 98.2 %
Patient temperature: 98.6
Patient temperature: 98.6
pCO2 arterial: 24.4 mmHg — ABNORMAL LOW (ref 32.0–48.0)
pCO2 arterial: 21.5 mmHg — ABNORMAL LOW (ref 32.0–48.0)
pH, Arterial: 7.194 — CL (ref 7.350–7.450)
pH, Arterial: 7.245 — ABNORMAL LOW (ref 7.350–7.450)
pO2, Arterial: 44.7 mmHg — ABNORMAL LOW (ref 83.0–108.0)
pO2, Arterial: 123 mmHg — ABNORMAL HIGH (ref 83.0–108.0)

## 2021-07-30 LAB — AMMONIA
Ammonia: 109 umol/L — ABNORMAL HIGH (ref 9–35)
Ammonia: 70 umol/L — ABNORMAL HIGH (ref 9–35)

## 2021-07-30 LAB — VOLATILES,BLD-ACETONE,ETHANOL,ISOPROP,METHANOL
Acetone, blood: 0.041 g/dL — ABNORMAL HIGH (ref 0.000–0.010)
Ethanol, blood: <0.01 g/dL (ref 0.000–0.010)
Isopropanol, blood: <0.01 g/dL (ref 0.000–0.010)
Methanol, blood: <0.01 g/dL (ref 0.000–0.010)

## 2021-07-30 LAB — GLUCOSE, CAPILLARY: Glucose-Capillary: 107 mg/dL — ABNORMAL HIGH (ref 70–99)

## 2021-07-30 LAB — PROTIME-INR
INR: 1.3 — ABNORMAL HIGH (ref 0.8–1.2)
INR: 1.3 — ABNORMAL HIGH (ref 0.8–1.2)
Prothrombin Time: 16.2 seconds — ABNORMAL HIGH (ref 11.4–15.2)
Prothrombin Time: 16.3 seconds — ABNORMAL HIGH (ref 11.4–15.2)

## 2021-07-30 LAB — CK TOTAL AND CKMB (NOT AT ARMC)
CK, MB: 11.3 ng/mL — ABNORMAL HIGH (ref 0.5–5.0)
Relative Index: 8 — ABNORMAL HIGH (ref 0.0–2.5)
Total CK: 141 U/L (ref 38–234)

## 2021-07-30 LAB — ACETAMINOPHEN LEVEL
Acetaminophen (Tylenol), Serum: <10 ug/mL — ABNORMAL LOW (ref 10–30)
Acetaminophen (Tylenol), Serum: <10 ug/mL — ABNORMAL LOW (ref 10–30)

## 2021-07-30 LAB — CBC
HCT: 41.9 % (ref 36.0–46.0)
Hemoglobin: 13.2 g/dL (ref 12.0–15.0)
MCH: 29.8 pg (ref 26.0–34.0)
MCHC: 31.5 g/dL (ref 30.0–36.0)
MCV: 94.6 fL (ref 80.0–100.0)
Platelets: 302 10*3/uL (ref 150–400)
RBC: 4.43 MIL/uL (ref 3.87–5.11)
RDW: 15.4 % (ref 11.5–15.5)
WBC: 21 10*3/uL — ABNORMAL HIGH (ref 4.0–10.5)
nRBC: 0.1 % (ref 0.0–0.2)

## 2021-07-30 LAB — DIC (DISSEMINATED INTRAVASCULAR COAGULATION)PANEL
D-Dimer, Quant: 1.31 ug/mL-FEU — ABNORMAL HIGH (ref 0.00–0.50)
Fibrinogen: 228 mg/dL (ref 210–475)
INR: 1.3 — ABNORMAL HIGH (ref 0.8–1.2)
Platelets: 267 10*3/uL (ref 150–400)
Prothrombin Time: 16.4 seconds — ABNORMAL HIGH (ref 11.4–15.2)
Smear Review: NONE SEEN
aPTT: 27 seconds (ref 24–36)

## 2021-07-30 LAB — RAPID URINE DRUG SCREEN, HOSP PERFORMED
Amphetamines: NOT DETECTED
Barbiturates: NOT DETECTED
Benzodiazepines: NOT DETECTED
Cocaine: NOT DETECTED
Opiates: NOT DETECTED
Tetrahydrocannabinol: NOT DETECTED

## 2021-07-30 LAB — MRSA NEXT GEN BY PCR, NASAL: MRSA by PCR Next Gen: NOT DETECTED

## 2021-07-30 LAB — CBG MONITORING, ED: Glucose-Capillary: 95 mg/dL (ref 70–99)

## 2021-07-30 LAB — I-STAT BETA HCG BLOOD, ED (MC, WL, AP ONLY): I-stat hCG, quantitative: <5 m[IU]/mL (ref <5)

## 2021-07-30 LAB — PHOSPHORUS: Phosphorus: 2.9 mg/dL (ref 2.5–4.6)

## 2021-07-30 LAB — AMYLASE: Amylase: 455 U/L — ABNORMAL HIGH (ref 28–100)

## 2021-07-30 LAB — OSMOLALITY
Osmolality: 322 mOsm/kg (ref 275–295)
Osmolality: 325 mOsm/kg (ref 275–295)

## 2021-07-30 LAB — ETHYLENE GLYCOL: Ethylene Glycol Lvl: <5 mg/dL

## 2021-07-30 LAB — LACTIC ACID, PLASMA
Lactic Acid, Venous: 1.5 mmol/L (ref 0.5–1.9)
Lactic Acid, Venous: 0.8 mmol/L (ref 0.5–1.9)

## 2021-07-30 LAB — MAGNESIUM: Magnesium: 2.2 mg/dL (ref 1.7–2.4)

## 2021-07-30 LAB — SALICYLATE LEVEL: Salicylate Lvl: <7 mg/dL — ABNORMAL LOW (ref 7.0–30.0)

## 2021-07-30 LAB — LIPASE, BLOOD: Lipase: 322 U/L — ABNORMAL HIGH (ref 11–51)

## 2021-07-30 LAB — TROPONIN I (HIGH SENSITIVITY)
Troponin I (High Sensitivity): 15 ng/L (ref <18)
Troponin I (High Sensitivity): 18 ng/L — ABNORMAL HIGH (ref <18)

## 2021-07-30 LAB — BETA-HYDROXYBUTYRIC ACID: Beta-Hydroxybutyric Acid: >8 mmol/L — ABNORMAL HIGH (ref 0.05–0.27)

## 2021-07-30 LAB — APTT: aPTT: 26 seconds (ref 24–36)

## 2021-07-30 LAB — ETHANOL: Alcohol, Ethyl (B): <10 mg/dL (ref <10)

## 2021-07-30 MED ORDER — ORAL CARE MOUTH RINSE
15.0000 mL | Freq: Two times a day (BID) | OROMUCOSAL | Status: DC
  Administered 2021-07-30 – 2021-08-10 (×18): 15 mL via OROMUCOSAL

## 2021-07-30 MED ORDER — CHLORHEXIDINE GLUCONATE 0.12 % MT SOLN
15.0000 mL | Freq: Two times a day (BID) | OROMUCOSAL | Status: DC
  Administered 2021-07-30 – 2021-08-10 (×21): 15 mL via OROMUCOSAL
  Filled 2021-07-30 (×19): qty 15

## 2021-07-30 MED ORDER — PYRIDOXINE HCL 100 MG/ML IJ SOLN
100.0000 mg | Freq: Once | INTRAMUSCULAR | Status: AC
  Administered 2021-07-30: 100 mg via INTRAVENOUS
  Filled 2021-07-30: qty 1

## 2021-07-30 MED ORDER — PANTOPRAZOLE SODIUM 40 MG IV SOLR
40.0000 mg | Freq: Every day | INTRAVENOUS | Status: DC
  Administered 2021-07-30 – 2021-08-01 (×3): 40 mg via INTRAVENOUS
  Filled 2021-07-30 (×3): qty 40

## 2021-07-30 MED ORDER — HEPARIN SODIUM (PORCINE) 5000 UNIT/ML IJ SOLN
5000.0000 [IU] | Freq: Three times a day (TID) | INTRAMUSCULAR | Status: DC
  Administered 2021-07-30 – 2021-08-10 (×34): 5000 [IU] via SUBCUTANEOUS
  Filled 2021-07-30 (×33): qty 1

## 2021-07-30 MED ORDER — ONDANSETRON HCL 4 MG/2ML IJ SOLN: 4.0000 mg | Freq: Once | INTRAMUSCULAR | Status: DC

## 2021-07-30 MED ORDER — HALOPERIDOL LACTATE 5 MG/ML IJ SOLN
2.0000 mg | Freq: Once | INTRAMUSCULAR | Status: DC
  Filled 2021-07-30: qty 1

## 2021-07-30 MED ORDER — SODIUM CHLORIDE 0.9 % IV SOLN
2.0000 g | Freq: Once | INTRAVENOUS | Status: AC
  Administered 2021-07-30: 2 g via INTRAVENOUS
  Filled 2021-07-30: qty 20

## 2021-07-30 MED ORDER — SODIUM CHLORIDE 0.9 % IV BOLUS
2000.0000 mL | Freq: Once | INTRAVENOUS | Status: AC
  Administered 2021-07-30: 2000 mL via INTRAVENOUS

## 2021-07-30 MED ORDER — DEXTROSE-NACL 5-0.45 % IV SOLN: INTRAVENOUS | Status: DC

## 2021-07-30 MED ORDER — IOHEXOL 350 MG/ML SOLN
80.0000 mL | Freq: Once | INTRAVENOUS | Status: AC | PRN
  Administered 2021-07-30: 80 mL via INTRAVENOUS

## 2021-07-30 MED ORDER — FOLIC ACID 5 MG/ML IJ SOLN
1.0000 mg | Freq: Every day | INTRAMUSCULAR | Status: DC
  Administered 2021-07-30: 1 mg via INTRAVENOUS
  Filled 2021-07-30: qty 0.2

## 2021-07-30 MED ORDER — SODIUM CHLORIDE 0.9 % IV SOLN
15.0000 mg/kg | Freq: Once | INTRAVENOUS | Status: AC
  Administered 2021-07-30: 1830 mg via INTRAVENOUS
  Filled 2021-07-30: qty 1.83

## 2021-07-30 MED ORDER — LORAZEPAM 2 MG/ML IJ SOLN
2.0000 mg | Freq: Once | INTRAMUSCULAR | Status: AC
  Administered 2021-07-30: 2 mg via INTRAMUSCULAR
  Filled 2021-07-30: qty 1

## 2021-07-30 MED ORDER — POLYETHYLENE GLYCOL 3350 17 G PO PACK: 17.0000 g | PACK | Freq: Every day | ORAL | Status: DC | PRN

## 2021-07-30 MED ORDER — METRONIDAZOLE 500 MG/100ML IV SOLN
500.0000 mg | Freq: Once | INTRAVENOUS | Status: AC
  Administered 2021-07-30: 500 mg via INTRAVENOUS
  Filled 2021-07-30: qty 100

## 2021-07-30 MED ORDER — SODIUM BICARBONATE 8.4 % IV SOLN
INTRAVENOUS | Status: DC
  Filled 2021-07-30: qty 1000
  Filled 2021-07-30 (×3): qty 150
  Filled 2021-07-30: qty 1000
  Filled 2021-07-30: qty 150
  Filled 2021-07-30: qty 1000
  Filled 2021-07-30: qty 150
  Filled 2021-07-30: qty 1000

## 2021-07-30 MED ORDER — FENTANYL CITRATE (PF) 100 MCG/2ML IJ SOLN
25.0000 ug | INTRAMUSCULAR | Status: DC | PRN
  Administered 2021-07-30 – 2021-08-02 (×17): 25 ug via INTRAVENOUS
  Filled 2021-07-30 (×18): qty 2

## 2021-07-30 MED ORDER — SODIUM CHLORIDE 0.9 % IV BOLUS
1000.0000 mL | Freq: Once | INTRAVENOUS | Status: AC
  Administered 2021-07-30: 1000 mL via INTRAVENOUS

## 2021-07-30 MED ORDER — POTASSIUM CHLORIDE 10 MEQ/100ML IV SOLN
10.0000 meq | INTRAVENOUS | Status: AC
Start: 2021-07-30 — End: 2021-07-31
  Administered 2021-07-30 (×4): 10 meq via INTRAVENOUS
  Filled 2021-07-30 (×4): qty 100

## 2021-07-30 MED ORDER — CHLORHEXIDINE GLUCONATE CLOTH 2 % EX PADS
6.0000 | MEDICATED_PAD | Freq: Every day | CUTANEOUS | Status: DC
  Administered 2021-07-30 – 2021-08-10 (×10): 6 via TOPICAL

## 2021-07-30 MED ORDER — SODIUM CHLORIDE 0.9 % IV SOLN: 1.0000 mg | Freq: Every day | INTRAVENOUS | Status: DC

## 2021-07-30 MED ORDER — LACTATED RINGERS IV BOLUS
1000.0000 mL | Freq: Once | INTRAVENOUS | Status: AC
  Administered 2021-07-30: 1000 mL via INTRAVENOUS

## 2021-07-30 MED ORDER — THIAMINE HCL 100 MG/ML IJ SOLN: 100.0000 mg | Freq: Every day | INTRAMUSCULAR | Status: DC

## 2021-07-30 MED ORDER — ACETONE (URINE) TEST VI STRP: 1.0000 | ORAL_STRIP | Status: DC | PRN

## 2021-07-30 MED ORDER — DOCUSATE SODIUM 100 MG PO CAPS: 100.0000 mg | ORAL_CAPSULE | Freq: Two times a day (BID) | ORAL | Status: DC | PRN

## 2021-07-30 MED ORDER — SODIUM BICARBONATE 8.4 % IV SOLN
150.0000 meq | Freq: Once | INTRAVENOUS | Status: AC
  Administered 2021-07-30: 150 meq via INTRAVENOUS
  Filled 2021-07-30: qty 50

## 2021-07-30 MED ORDER — ONDANSETRON 4 MG PO TBDP
4.0000 mg | ORAL_TABLET | Freq: Once | ORAL | Status: AC
  Administered 2021-07-30: 4 mg via ORAL
  Filled 2021-07-30: qty 1

## 2021-07-30 MED ORDER — POTASSIUM CHLORIDE 10 MEQ/50ML IV SOLN: 10.0000 meq | INTRAVENOUS | Status: DC

## 2021-07-30 MED ORDER — HALOPERIDOL LACTATE 5 MG/ML IJ SOLN
5.0000 mg | Freq: Once | INTRAMUSCULAR | Status: AC
  Administered 2021-07-30: 5 mg via INTRAMUSCULAR

## 2021-07-30 MED ORDER — THIAMINE HCL 100 MG/ML IJ SOLN
100.0000 mg | Freq: Once | INTRAMUSCULAR | Status: AC
  Administered 2021-07-30: 100 mg via INTRAVENOUS
  Filled 2021-07-30: qty 2

## 2021-07-30 MED ORDER — LORAZEPAM 2 MG/ML IJ SOLN
1.0000 mg | INTRAMUSCULAR | Status: DC | PRN
  Administered 2021-07-30: 1 mg via INTRAVENOUS
  Filled 2021-07-30: qty 1

## 2021-07-30 MED ORDER — THIAMINE HCL 100 MG/ML IJ SOLN
100.0000 mg | Freq: Two times a day (BID) | INTRAMUSCULAR | Status: DC
  Administered 2021-07-30 – 2021-08-02 (×6): 100 mg via INTRAVENOUS
  Filled 2021-07-30 (×6): qty 2

## 2021-07-30 MED ORDER — FOLIC ACID 5 MG/ML IJ SOLN
1.0000 mg | Freq: Two times a day (BID) | INTRAMUSCULAR | Status: DC
  Administered 2021-07-30 – 2021-08-02 (×6): 1 mg via INTRAVENOUS
  Filled 2021-07-30 (×6): qty 0.2

## 2021-07-30 NOTE — ED Notes (Addendum)
RN went into start an IV. Patient laying on her stomach not cooperating or listening to RN or NT, Holly Roberts. RN told patient she needed to lay on her back so IV access can be obtained. Patient continues to not listen or cooperate for IV.

## 2021-07-30 NOTE — ED Notes (Addendum)
I have informed the pt and her x2 visitors of the ED visitor policy. I informed them that pt will be able to have x1 visitor when she is back in a treatment/exam room, up to assigned RN discretion.

## 2021-07-30 NOTE — ED Provider Notes (Signed)
[facility]-EMERGENCY DEPT [facility] Emergency Department Provider Note MRN:  [id number]  Arrival date & time: 07/30/21     Chief Complaint   Nausea   History of Present Illness   Holly Roberts is a 55 y.o. year-old female with a history of bipolar disorder presenting to the ED with chief complaint of abdominal pain.  Diffuse abdominal pain with nausea for the past several hours.  Patient is altered and unable to provide further history.  I was unable to obtain an accurate HPI, PMH, or ROS due to the patient's altered mental status.  Level 5 caveat.  Review of Systems  Positive for abdominal pain, nausea, altered mental status.  Patient's Health History    Past Medical History:  Diagnosis Date   Anxiety    Bipolar disorder (HCC)    Chronic pain    Depression    Hypertension    patient denies    Past Surgical History:  Procedure Laterality Date   CHOLECYSTECTOMY N/A 04/20/2021   Procedure: LAPAROSCOPIC CHOLECYSTECTOMY WITH INTRAOPERATIVE CHOLANGIOGRAM;  Surgeon: [name], MD;  Location: [facility] ORS;  Service: General;  Laterality: N/A;   Right knee surgery     TONSILLECTOMY      Family History  Problem Relation Age of Onset   Cancer Father     Social History   Socioeconomic History   Marital status: Single    Spouse name: Not on file   Number of children: Not on file   Years of education: Not on file   Highest education level: Not on file  Occupational History   Not on file  Tobacco Use   Smoking status: Never   Smokeless tobacco: Never  Vaping Use   Vaping Use: Never used  Substance and Sexual Activity   Alcohol use: Yes    Comment: rarely   Drug use: Never   Sexual activity: Not on file  Other Topics Concern   Not on file  Social History Narrative   Not on file   Social Determinants of Health   Financial Resource Strain: Not on file  Food Insecurity: Not on file  Transportation Needs: Not on file  Physical Activity: Not on file  Stress: Not  on file  Social Connections: Not on file  Intimate Partner Violence: Not on file     Physical Exam   Vitals:   07/30/21 0425 07/30/21 0515  BP: (!) 155/97 (!) 166/103  Pulse: (!) 120 (!) 131  Resp: (!) 40 (!) 24  Temp:    SpO2: 100% 97%    CONSTITUTIONAL: Chronically ill-appearing, in moderate distress due to pain NEURO: Awake, oriented to name only, moves all extremities EYES:  eyes equal and reactive ENT/NECK:  no LAD, no JVD CARDIO: Tachycardic rate, well-perfused, normal S1 and S2 PULM: Tachypneic GI/GU:  normal bowel sounds, non-distended, non-tender MSK/SPINE:  No gross deformities, no edema SKIN:  no rash, atraumatic PSYCH:  Appropriate speech and behavior  *Additional and/or pertinent findings included in MDM below  Diagnostic and Interventional Summary    EKG Interpretation  Date/Time:  Sunday July 30 2021 02:27:48 EDT Ventricular Rate:  117 PR Interval:  162 QRS Duration: 108 QT Interval:  331 QTC Calculation: 462 R Axis:   6 Text Interpretation: Sinus tachycardia Biatrial enlargement Low voltage, precordial leads RSR' in V1 or V2, right VCD or RVH Confirmed by Kennis Carina [phone]) on 07/30/2021 2:29:20 AM       Labs Reviewed  COMPREHENSIVE METABOLIC PANEL - Abnormal; Notable for the following components:  Result Value   CO2 7 (*)    Glucose, Bld 116 (*)    Creatinine, Ser 1.26 (*)    Calcium 10.4 (*)    Total Protein 9.1 (*)    Albumin 5.5 (*)    Alkaline Phosphatase 141 (*)    Total Bilirubin 1.8 (*)    GFR, Estimated 50 (*)    Anion gap 28 (*)    All other components within normal limits  CBC WITH DIFFERENTIAL/PLATELET - Abnormal; Notable for the following components:   WBC 20.1 (*)    HCT 46.1 (*)    Neutro Abs 18.0 (*)    Lymphs Abs 0.6 (*)    Monocytes Absolute 1.1 (*)    Abs Immature Granulocytes 0.31 (*)    All other components within normal limits  PROTIME-INR - Abnormal; Notable for the following components:   Prothrombin  Time 16.2 (*)    INR 1.3 (*)    All other components within normal limits  AMMONIA - Abnormal; Notable for the following components:   Ammonia 109 (*)    All other components within normal limits  BLOOD GAS, VENOUS - Abnormal; Notable for the following components:   pH, Ven 7.087 (*)    pCO2, Ven 19.3 (*)    pO2, Ven 47.9 (*)    Bicarbonate 5.5 (*)    Acid-base deficit 23.9 (*)    All other components within normal limits  ACETAMINOPHEN LEVEL - Abnormal; Notable for the following components:   Acetaminophen (Tylenol), Serum <10 (*)    All other components within normal limits  SALICYLATE LEVEL - Abnormal; Notable for the following components:   Salicylate Lvl <7.0 (*)    All other components within normal limits  CULTURE, BLOOD (SINGLE)  LACTIC ACID, PLASMA  APTT  ETHANOL  URINALYSIS, ROUTINE W REFLEX MICROSCOPIC  OSMOLALITY  ETHYLENE GLYCOL  VOLATILES,BLD-ACETONE,ETHANOL,ISOPROP,METHANOL  I-STAT BETA HCG BLOOD, ED ([facility], [facility], [facility] ONLY)  CBG MONITORING, ED  TROPONIN I (HIGH SENSITIVITY)    CT HEAD WO CONTRAST ( )  Final Result    CT ABDOMEN PELVIS W CONTRAST  Final Result    CT Angio Chest Pulmonary Embolism (PE) W or WO Contrast  Final Result    DG Chest Port 1 View  Final Result      Medications  LORazepam (ATIVAN) injection 1 mg (1 mg Intravenous Given 07/30/21 0525)  metroNIDAZOLE (FLAGYL) IVPB 500 mg (500 mg Intravenous New Bag/Given 07/30/21 0612)  sodium bicarbonate injection 150 mEq (has no administration in time range)  fomepizole (ANTIZOL) 1,830 mg in sodium chloride 0.9 % 100 mL IVPB (has no administration in time range)  thiamine (B-1) injection 100 mg (has no administration in time range)  pyridOXINE (B-6) injection 100 mg (has no administration in time range)  ondansetron (ZOFRAN-ODT) disintegrating tablet 4 mg (4 mg Oral Given 07/30/21 0030)  lactated ringers bolus 1,000 mL (1,000 mLs Intravenous New Bag/Given 07/30/21 0418)  haloperidol lactate (HALDOL)  injection 5 mg (5 mg Intramuscular Given 07/30/21 0258)  LORazepam (ATIVAN) injection 2 mg (2 mg Intramuscular Given 07/30/21 0416)  cefTRIAXone (ROCEPHIN) 2 g in sodium chloride 0.9 % 100 mL IVPB (0 g Intravenous Stopped 07/30/21 0614)  iohexol (OMNIPAQUE) 350 MG/ML injection 80 mL (80 mLs Intravenous Contrast Given 07/30/21 0554)     Procedures  /  Critical Care .Critical Care  Date/Time: 07/30/2021 4:30 AM Performed by: Sabas Sous, MD Authorized by: Sabas Sous, MD   Critical care provider statement:    Critical care  time (minutes):  80   Critical care was necessary to treat or prevent imminent or life-threatening deterioration of the following conditions: Severe metabolic acidosis.   Critical care was time spent personally by me on the following activities:  Discussions with consultants, evaluation of patient's response to treatment, examination of patient, ordering and performing treatments and interventions, ordering and review of laboratory studies, ordering and review of radiographic studies, pulse oximetry, re-evaluation of patient's condition, obtaining history from patient or surrogate and review of old charts Ultrasound ED Peripheral IV (Provider)  Date/Time: 07/30/2021 4:31 AM Performed by: Sabas Sous, MD Authorized by: Sabas Sous, MD   Procedure details:    Indications: multiple failed IV attempts     Skin Prep: chlorhexidine gluconate     Location:  Right AC   Angiocath:  20 G   Bedside Ultrasound Guided: Yes     Patient tolerated procedure without complications: Yes     Dressing applied: Yes    ED Course and Medical Decision Making  I have reviewed the triage vital signs, the nursing notes, and pertinent available records from the EMR.  Listed above are laboratory and imaging tests that I personally ordered, reviewed, and interpreted and then considered in my medical decision making (see below for details).  Patient has had similar presentations for  diffuse abdominal pain ever since having gallbladder surgery.  Evaluated 6 days ago with a negative work-up.  However the altered mental status is a new feature.  Considering metabolic disarray, sepsis, intracranial bleeding, perforated viscus.  Patient is tachycardic and tachypneic, favoring related to discomfort, providing Haldol and will reassess.     Patient remains agitated, altered, without capacity to make decisions.  Needed some physical restraint to obtain IV and obtain labs.  Initial VBG demonstrating metabolic acidosis.  Patient seems to have appropriate respiratory compensation.  She remains tachycardic, breathing up to 40 times per minute.  Given her continued agitation, pulling out her IV, pulling off monitoring, intubation was considered but felt to be a risky procedure given the amount of patient's respiratory compensation.  Using soft restraints and IV Ativan for agitation with good effect.  Differential diagnosis for this significant gap acidosis includes DKA, salicylate toxicity, methanol or ethylene glycol poisoning.  Also considering underlying acute surgical process or sepsis, PE.  Awaiting CT imaging.  CT imaging is without acute process.  Discussed case with both critical care and poison control.  Empirically giving IV fomepizole, thiamine, pyridoxine, and giving 3 A of bicarb.  To be admitted to ICU.  Elmer Sow. Pilar Plate, MD [facility] Emergency Medicine [facility] [email or web address]  Final Clinical Impressions(s) / ED Diagnoses     ICD-10-CM   1. Metabolic acidosis  E87.2       ED Discharge Orders     None        Discharge Instructions Discussed with and Provided to Patient:   Discharge Instructions   None       Sabas Sous, MD 07/30/21 [phone]

## 2021-07-30 NOTE — ED Notes (Signed)
Pt daughter Carollee Herter would like update when available

## 2021-07-30 NOTE — Progress Notes (Signed)
Pharmacy Brief Note - Poison Control Case Follow Up:  Patient presented with metabolic acidosis, case was opened with poison control this morning and pt received a dose of fomepizole, thiamine, pyridoxine, and 150 mEq sodium bicarbonate.   The following labs were obtained: Acetaminophen level, salicylate level, ethylene glycol, and volatiles panel.   At the request of MD, I called Poison Control to report lab results for guidance on if additional fomepizole dosing needed.  The following lab results were provided to poison control:   -BMP, acetaminophen level, salicylate level, volatiles, ethylene glycol, amylase, lipase, UA Ketones, vitals, Beta-hydroxybutyric acid.  Per poison control recommended, no additional fomepizole needed. Recommend trending electrolytes, bicarb.  Cindi Carbon, PharmD 07/30/21 1:41 PM

## 2021-07-30 NOTE — ED Notes (Signed)
Pt said she wanted to leave, she got up on her own and started walking towards the lobby. Halfway there she sat down in a chair saying she wants to go home. Charge nurse and pt nurse are aware

## 2021-07-30 NOTE — Progress Notes (Signed)
Morning Osm gap is 26.0 mOsm/kg Osm Gap - This is a significant Osm Gap; if there is also an anion gap acidosis, you should strongly consider methanol or ethylene glycol intoxication. However, all alcohol measurements are negative from this morning. Daughter denies suurepptios untake. Other causes of Osmolal Gap include other alcohols (acetone, isopropyl alcohol, polyethylene or propylene glycol), sugars (mannitol, sorbitol), lipids (hypertriglyceridemia) or proteins (hypergammaglobulinemia).  Urine tox - negative   LAb review shows-  - bic 11 improved from 7 - anion gap 22 (was 28) improved - acetone 0.041 and p - beta Hydroxyb Butrate > 8  A - starvation ketosis  - low k - improving gap/   Plan  - check TGL 07/31/21 - contninue bic with d5 at 125cc  - replete K   LABS    PULMONARY Recent Labs  Lab 07/30/21 0420 07/30/21 1116 07/30/21 1659  PHART  --  7.194* 7.245*  PCO2ART  --  24.4* 21.5*  PO2ART  --  44.7* 123*  HCO3 5.5* 9.0* 9.0*  O2SAT 71.5 75.1 98.2    CBC Recent Labs  Lab 07/30/21 0226 07/30/21 0843 07/30/21 1705 07/30/21 1717  HGB 14.8 13.2  --  12.5  HCT 46.1* 41.9  --  40.5  WBC 20.1* 21.0*  --  18.7*  PLT 374 302 267 253    COAGULATION Recent Labs  Lab 07/30/21 0226 07/30/21 0843 07/30/21 1705  INR 1.3* 1.3* PENDING    CARDIAC  No results for input(s): TROPONINI in the last 168 hours. No results for input(s): PROBNP in the last 168 hours.   CHEMISTRY Recent Labs  Lab 07/24/21 0953 07/30/21 0226 07/30/21 0843 07/30/21 1717  NA 141 140 143 147*  K 4.1 3.5 3.6 3.4*  CL 98 105 105 114*  CO2 27 7* 9* 11*  GLUCOSE 86 116* 112* 100*  BUN 12 20 20 14   CREATININE 0.71 1.26* 1.10* 0.98  CALCIUM 10.1 10.4* 9.8 9.5  MG  --   --  2.2  --   PHOS  --   --  2.9  --    Estimated Creatinine Clearance: 83.6 mL/min (by C-G formula based on SCr of 0.98 mg/dL).   LIVER Recent Labs  Lab 07/24/21 0953 07/30/21 0226 07/30/21 0843  07/30/21 1705  AST 20 23 19   --   ALT 18 27 22   --   ALKPHOS 125 141* 128*  --   BILITOT 0.7 1.8* 2.0*  --   PROT 8.0 9.1* 8.0  --   ALBUMIN 4.7 5.5* 4.9  --   INR  --  1.3* 1.3* PENDING     INFECTIOUS Recent Labs  Lab 07/30/21 0226 07/30/21 0842  LATICACIDVEN 1.5 0.8     ENDOCRINE CBG (last 3)  Recent Labs    07/30/21 0442  GLUCAP 95         IMAGING x48h  - image(s) personally visualized  -   highlighted in bold CT ABDOMEN PELVIS WO CONTRAST  Result Date: 07/30/2021 CLINICAL DATA:  Abdominal pain and elevated lipase, unremarkable CT earlier in the same day. EXAM: CT ABDOMEN AND PELVIS WITHOUT CONTRAST TECHNIQUE: Multidetector CT imaging of the abdomen and pelvis was performed following the standard protocol without IV contrast. Examination is significantly limited by patient motion artifact. COMPARISON:  07/30/2021 FINDINGS: Lower chest: No acute abnormality is noted in the bases bilaterally. Hepatobiliary: No focal liver abnormality is seen. Status post cholecystectomy. No biliary dilatation. Pancreas: Pancreas shows no mass lesion or ductal obstruction. Some  very minimal peripancreatic inflammatory changes are seen which may represent some mild acute pancreatitis. These changes have progressed slightly in the interval from the prior examination this morning. Spleen: Normal in size without focal abnormality. Adrenals/Urinary Tract: Adrenal glands are within normal limits. Kidneys show no renal calculi. Some residual contrast is seen within the right collecting system as well as the bladder. 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 Stomach/Bowel: Colon shows no obstructive or inflammatory changes. The appendix is not well seen although no inflammatory changes are seen. Small bowel and stomach are within normal limits. Vascular/Lymphatic: Aortic atherosclerosis. No enlarged abdominal or pelvic lymph nodes. Reproductive: Uterus and bilateral adnexa are  unremarkable. Other: No abdominal wall hernia or abnormality. No abdominopelvic ascites. Musculoskeletal: No acute or significant osseous findings. IMPRESSION: Very minimal peripancreatic inflammatory change near the head of the pancreas. No significant phlegmon is noted. These changes are likely related to very mild acute appendicitis. Chronic changes similar to that seen on the prior exam. Examination is somewhat limited due to motion artifact. Electronically Signed   By: Alcide Clever M.D.   On: 07/30/2021 17:06   CT HEAD WO CONTRAST ( )  Result Date: 07/30/2021 CLINICAL DATA:  55 year old female with nausea, decreased p.o. Delirium, combative. EXAM: CT HEAD WITHOUT CONTRAST TECHNIQUE: Contiguous axial images were obtained from the base of the skull through the vertex without intravenous contrast. COMPARISON:  None. FINDINGS: Brain: Chronic encephalomalacia right inferior occipital lobe. Associated mild ex vacuo enlargement of the right occipital horn. Elsewhere cerebral volume is preserved, gray-white matter differentiation within normal limits. No superimposed No midline shift, ventriculomegaly, mass effect, evidence of mass lesion, intracranial hemorrhage or evidence of cortically based acute infarction. Vascular: Calcified atherosclerosis at the skull base. No suspicious intracranial vascular hyperdensity. Skull: Negative. Sinuses/Orbits: Visualized paranasal sinuses and mastoids are clear. Other: Visualized orbits and scalp soft tissues are within normal limits. IMPRESSION: Chronic Right PCA infarct.  No acute intracranial abnormality. Electronically Signed   By: Odessa Fleming M.D.   On: 07/30/2021 06:17   CT Angio Chest Pulmonary Embolism (PE) W or WO Contrast  Result Date: 07/30/2021 CLINICAL DATA:  Nausea.  Concern pulmonary embolism. EXAM: CT ANGIOGRAPHY CHEST WITH CONTRAST TECHNIQUE: Multidetector CT imaging of the chest was performed using the standard protocol during bolus administration of  intravenous contrast. Multiplanar CT image reconstructions and MIPs were obtained to evaluate the vascular anatomy. CONTRAST:  63mL OMNIPAQUE IOHEXOL 350 MG/ML SOLN COMPARISON:  None. FINDINGS: Cardiovascular: No filling defects within the pulmonary arteries to suggest acute pulmonary embolism. Mediastinum/Nodes: No axillary or supraclavicular adenopathy. No mediastinal or hilar adenopathy. No pericardial fluid. Esophagus normal. Lungs/Pleura: No pulmonary infarction. No pneumonia. No pleural fluid. Upper Abdomen: Low-attenuation liver suggests hepatic steatosis. Musculoskeletal: Review of the MIP images confirms the above findings. IMPRESSION: 1. No evidence of pulmonary embolism. 2. No evidence of pneumonia or pulmonary infarction. 3. Hepatic steatosis. Electronically Signed   By: Genevive Bi M.D.   On: 07/30/2021 06:20   CT ABDOMEN PELVIS W CONTRAST  Result Date: 07/30/2021 CLINICAL DATA:  Abdominal pain, acute, nonlocalized EXAM: CT ABDOMEN AND PELVIS WITH CONTRAST TECHNIQUE: Multidetector CT imaging of the abdomen and pelvis was performed using the standard protocol following bolus administration of intravenous contrast. CONTRAST:  63mL OMNIPAQUE IOHEXOL 350 MG/ML SOLN COMPARISON:  None. FINDINGS: Patient movement degrades imaging Lower chest: Lung bases are clear. Hepatobiliary: No focal hepatic lesion. Postcholecystectomy. No biliary dilatation. Low-attenuation liver suggests hepatic steatosis. Some streak artifact patient's  arms at side. Pancreas: Pancreas is normal. No ductal dilatation. No pancreatic inflammation. Spleen: Normal spleen Adrenals/urinary tract: Adrenal glands are normal. No hydronephrosis. Fat density lesion lower pole of the LEFT kidney measures 8 mm (image 44/2). Ureters and bladder normal. Stomach/Bowel: Small hiatal hernia. Stomach, small bowel, appendix, and cecum are normal. The colon and rectosigmoid colon are normal. Vascular/Lymphatic: Abdominal aorta is normal caliber. No  periportal or retroperitoneal adenopathy. No pelvic adenopathy. Reproductive: Uterus and adnexa unremarkable. Other: No free fluid. 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 No change from prior. IMPRESSION: 1. No acute findings in the abdomen pelvis. 2. No evidence of bowel obstruction. 3. Postcholecystectomy without complication. 4. Probable hepatic steatosis. 5. Small RIGHT renal benign angiomyolipoma. Electronically Signed   By: Genevive Bi M.D.   On: 07/30/2021 06:14   DG Chest Port 1 View  Result Date: 07/30/2021 CLINICAL DATA:  Nausea, questionable sepsis EXAM: PORTABLE CHEST 1 VIEW COMPARISON:  04/19/2021 FINDINGS: Low lung volumes. Lungs are clear.  No pleural effusion or pneumothorax. The heart is normal in size. IMPRESSION: No evidence of acute cardiopulmonary disease. Electronically Signed   By: Charline Bills M.D.   On: 07/30/2021 03:22

## 2021-07-30 NOTE — H&P (Addendum)
NAME:  Holly Roberts, MRN:  [id number], DOB:  [birth year], LOS: 0 ADMISSION DATE:  07/29/2021, CONSULTATION DATE:  07/29/2021  REFERRING MD:  Dr Pilar Plate of [facility], CHIEF COMPLAINT:  Acute encephalopathy and severe metabolic acidosis   History of Present Illness: from EDP and review of records. Patient unable to give hx. No family at bedside   55 year old obese female with significant bipolar disease, anxiety, depression and on multiple CNS medications listed below, along with chronic pain syndrome for which she is on gabapentin.  Originally admitted 04/20/2021 for acute cholecystitis.  Status post lap cholecystectomy 04/21/2021.  Postop course complicated by postcholecystectomy diarrhea.  She started on cholestyramine but it is no longer listed in the medical history.  She was then readmitted 06/09/2021 with 4-day history of vomiting and diarrhea and very poor oral intake, this was associated with a lipase of 263 and acute kidney injury with abnormal UA WBC greater than 50 creatinine 2.65 BUN 45, high anion gap metabolic acidosis greater than 20 with an albumin of 4.7, hypoglycemia, mild hyperbilirubinemia 2.2.  Her illness resolved with fluid therapy and her lab normalities resolved at the time of discharge 06/10/2021.  Then on 07/24/2021 she present to the ER with emesis and abdominal pain, diarrhea.  Anion gap again elevated to 16 [no acute kidney injury].  Symptoms resolved with fluids and she was discharged  She represented again on 07/29/2021 to the emergency department with significant nausea, decreased p.o. intake, reports of noncompliance with diet [?  Noncompliance with medications] was found to be in agitated encephalopathy [new compared to June 2022] with significant SIRS physiology [tachycardic tachypneic but afebrile and normal pulse ox] has severe anion gap metabolic acidosis with a gap of 28 and a high albumin of 5.5 [similar to June 2022] slightly elevated ammonia level but negative salicylate,  negative lactic acid negative Tylenol level and normal kidney function [in June 2022 she had acute kidney injury for similar presentation].  QTC and QRS normal. Serum OSM pending. Per daughter: patient has hx of poor water intake and overall food intake is lower after lap chole. Known to not eat/drink repeatedly for days on end esp currently 1 week prior to admit. Definitely was STARTVING prior week including no meds. Was vomiting a lot for 1 week prior to admit Daughter denied antifreeze    EDP started fomepizole after discussing with poison control.  Patient received 3 A of bicarb.  Given 1 dose of empiric antibiotic.  And 1 L of fluid  Results for MERTIS, EYESTONE (MRN [id number]) as of 07/30/2021 08:14  Ref. Range 07/30/2021 02:29  Alcohol, Ethyl (B) Latest Ref Range: <10 mg/dL <84  Salicylate Lvl Latest Ref Range: 7.0 - 30.0 mg/dL <6.9 (L)   Results for AMILYN, BOBLITT (MRN [id number]) as of 07/30/2021 08:14  Ref. Range 07/30/2021 02:29  Acetaminophen (Tylenol), S Latest Ref Range: 10 - 30 ug/mL <10 (L)    Or armodafinil, doxepin, Horizant, Nucynta, Trintellix and Vraylar  Pertinent  Medical History     has a past medical history of Anxiety, Bipolar disorder (HCC), Chronic pain, Depression, and Hypertension.   reports that she has never smoked. She has never used smokeless tobacco.  Past Surgical History:  Procedure Laterality Date   CHOLECYSTECTOMY N/A 04/20/2021   Procedure: LAPAROSCOPIC CHOLECYSTECTOMY WITH INTRAOPERATIVE CHOLANGIOGRAM;  Surgeon: [name], MD;  Location: [facility];  Service: General;  Laterality: N/A;   Right knee surgery     TONSILLECTOMY      Allergies  Allergen  Reactions   Buprenorphine Rash    Patch gave rash- poss adhesive issues. Tried generic and brand name both. Patient states: "I am not allergic to the medication. I am allergic to the adhesive, not the medication."    Amoxicillin-Pot Clavulanate     Other reaction(s): Other (See Comments) States  "strong" antibiotics cause C diff   Wound Dressing Adhesive     Rash   Sulfa Antibiotics Rash    Immunization History  Administered Date(s) Administered   PFIZER(Purple Top)SARS-COV-2 Vaccination 09/10/2020, 10/01/2020    Family History  Problem Relation Age of Onset   Cancer Father      Current Facility-Administered Medications:    LORazepam (ATIVAN) injection 1 mg, 1 mg, Intravenous, Q1H PRN, Sabas Sous, MD, 1 mg at 07/30/21 0525   sodium bicarbonate 150 mEq in dextrose 5 % 1,150 mL infusion, , Intravenous, Continuous, Ollin Hochmuth, MD   sodium chloride 0.9 % bolus 2,000 mL, 2,000 mL, Intravenous, Once, [name], MD  Current Outpatient Medications:    Armodafinil 250 MG tablet, Take 250 mg by mouth every morning., Disp: , Rfl:    diclofenac (VOLTAREN) 50 MG EC tablet, Take 50 mg by mouth daily., Disp: , Rfl:    doxepin (SINEQUAN) 100 MG capsule, Take 100 mg by mouth at bedtime., Disp: , Rfl:    HORIZANT 600 MG TBCR, Take 600-1,200 mg by mouth in the morning and at bedtime. Take 1 tablet (600 mg) in the morning and Take 2 tablets (1200 mg) at bedtime, Disp: , Rfl:    NUCYNTA 100 MG TABS, Take 200 mg by mouth daily in the afternoon., Disp: , Rfl:    NUCYNTA ER 200 MG TB12, Take 200 mg by mouth 2 (two) times daily., Disp: , Rfl:    TRINTELLIX 20 MG TABS tablet, Take 20 mg by mouth daily., Disp: , Rfl:    VRAYLAR 4.5 MG CAPS, Take 4.5 mg by mouth daily., Disp: , Rfl:    Significant Hospital Events: Including procedures, antibiotic start and stop dates in addition to other pertinent events   07/29/2021 - er 8/14 - seen by CCM at [facility] ER bed [facility]  Interim History / Subjective:  x  Objective   Blood pressure (!) 138/55, pulse (!) 127, temperature 97.6 F (36.4 C), temperature source Rectal, resp. rate (!) 36, SpO2 100 %.        Intake/Output Summary (Last 24 hours) at 07/30/2021 0807 Last data filed at 07/30/2021 0865 Gross per 24 hour  Intake 1000 ml   Output --  Net 1000 ml   There were no vitals filed for this visit.  Examination: General: Obese, On bed in ER  HENT: Looks dry oral mucoasa, poor dentitiion. No elevated JVP. No neck nodes Lungs: CTA bialterally but tachyepneic. Not paradoxical Cardiovascular: Tachcyardic. Normal heart sounds. Normal QTc Abdomen: obese Extremities: No cyanosis. No clubbing. No edema Neuro: Prior agitated. Now drowsy, no evidence of clonus.  No evidence of dilated pupil no muscular rigidity GU: Not examined  Resolved Hospital Problem list   x  Assessment & Plan:     History of emergency lap cholecystectomy May 2022 for acute cholecystitis  -Current issues below started after above Significant GI symptoms of reucrrent nausea vomiting, decreaed PO at the time of admission  -?  lap cholecystectomy related versus CNS medication related versus both  -CT abdomen at admit looks normal  ?  Combination of lap chole and CNS medications resulting in decreased p.o. intake and GI symptoms  resulting in dehydration and metabolic acidosis and acute kidney injury. Per daughter - does not abuse etoh. Suspect starvation ketoacidosis  P:   - NPOI - PPI  - NG tube - n might need [facility] consultation    Severe anion gap metabolic acidosis greater than 28, with high albumin at admission. -Suspected drug toxicity (less likely). STarvation ketoacidosis (more likely)  -Normal lactic acid, normal salicylate, normal ethanol level and normal Tylenol level -similar feature in June 2022 admission but at that time did not have encephalopathy but had acute kidney injury  Plan -Hydrate 2 L fluid bolus - Bicarb infusion at 125 cc/h with glucode -Check urine tox -Coordinate with poison control - Fomepizaole (first dose given in the ER 07/30/2021] - check betodhydroxy butrate - empiric thiamine and folic    Moderate respiratory distress due to acidosis and encephalopathy - present on admission AT high risk for  respiratory failure requiring intubation  07/30/2021 -> protecting airway and not paradoxical  P:   ICU monitoring Intubate if needed  AKI - preent on admit (June 2022 acute kidney injury with similar presentation of metabolic acidosis and GI symptoms -resolved with fluids)    P:  Monitor closely Maintain hemodynamics Avoid nephrotoxic drugs Hydrate   Baseline prior to and present on admit: depression, Bipolar and anxiety, chronic pain  -Or armodafinil, doxepin, Horizant, Nucynta, Trintellix and Vraylar  -Medications predispose to agitation, hallucination encephalopathy and serotonin syndrome  At admission: -  Acute agitated metabolic encephalopathy but no evidence of serotonin syndrome - no suicidal ideatioin  - Ct head evidednce of chronic PCA infarct present on admit   P:   Correct metabolic abnormalities Check CK Check for clonus Might need [facility] consultation once better    At risk for QTC prolongation and QRS prolongation with suspected drug toxicity -at admission  07/30/2021: Normal QRS and EKG at admission   P: Monitor    No evidence of infection.  Status post empiric antibiotic in the ER P:   Monitor without antibiotics   At high risk for electrolyte imbalance P: Monitor and replete as needed      AT risk anemi of ICU   P:  - PRBC for hgb </= 6.9gm%    - exceptions are   -  if ACS susepcted/confirmed then transfuse for hgb </= 8.0gm%,  or    -  active bleeding with hemodynamic instability, then transfuse regardless of hemoglobin value   At at all times try to transfuse 1 unit prbc as possible with exception of active hemorrhage     AT risk thrombocytopenia  P Monitor with SQ heparin   A:   Obesity. No DM   P:   ssi   Best Practice (right click and "Reselect all SmartList Selections" daily)   Diet/type: NPO DVT prophylaxis: prophylactic heparin  GI prophylaxis: PPI Lines: N/A Foley:  N/A Code Status:  full code Last date  of multidisciplinary goals of care discussion Symphany Huckabee [phone] LMTCBB ]      this is a meATTESTATION & SIGNATURE   The patient Holly Roberts is critically ill with multiple organ systems failure and requires high complexity decision making for assessment and support, frequent evaluation and titration of therapies, application of advanced monitoring technologies and extensive interpretation of multiple databases.   Critical Care Time devoted to patient care services described in this note is  75  Minutes. This time reflects time of care of this signee Dr [name]. This critical care  time does not reflect procedure time, or teaching time or supervisory time of PA/NP/Med student/Med Resident etc but could involve care discussion time     Dr. [name], M.D., [facility].C.P Pulmonary and Critical Care Medicine Staff Physician [facility] [facility] Pager: [phone], If no answer or between  15:00h - 7:00h: call [phone]  07/30/2021 8:08 AM     LABS    PULMONARY Recent Labs  Lab 07/30/21 0420  HCO3 5.5*  O2SAT 71.5    CBC Recent Labs  Lab 07/24/21 0953 07/30/21 0226  HGB 13.4 14.8  HCT 40.6 46.1*  WBC 6.9 20.1*  PLT 266 374    COAGULATION Recent Labs  Lab 07/30/21 0226  INR 1.3*    CARDIAC  No results for input(s): TROPONINI in the last 168 hours. No results for input(s): PROBNP in the last 168 hours.   CHEMISTRY Recent Labs  Lab 07/24/21 0953 07/30/21 0226  NA 141 140  K 4.1 3.5  CL 98 105  CO2 27 7*  GLUCOSE 86 116*  BUN 12 20  CREATININE 0.71 1.26*  CALCIUM 10.1 10.4*   Estimated Creatinine Clearance: 65 mL/min (A) (by C-G formula based on SCr of 1.26 mg/dL (H)).   LIVER Recent Labs  Lab 07/24/21 0953 07/30/21 0226  AST 20 23  ALT 18 27  ALKPHOS 125 141*  BILITOT 0.7 1.8*  PROT 8.0 9.1*  ALBUMIN 4.7 5.5*  INR  --  1.3*     INFECTIOUS Recent Labs  Lab  07/30/21 0226  LATICACIDVEN 1.5     ENDOCRINE CBG (last 3)  Recent Labs    07/30/21 0442  GLUCAP 95         IMAGING x48h  - image(s) personally visualized  -   highlighted in bold CT HEAD WO CONTRAST ( )  Result Date: 07/30/2021 CLINICAL DATA:  54 year old female with nausea, decreased p.o. Delirium, combative. EXAM: CT HEAD WITHOUT CONTRAST TECHNIQUE: Contiguous axial images were obtained from the base of the skull through the vertex without intravenous contrast. COMPARISON:  None. FINDINGS: Brain: Chronic encephalomalacia right inferior occipital lobe. Associated mild ex vacuo enlargement of the right occipital horn. Elsewhere cerebral volume is preserved, gray-white matter differentiation within normal limits. No superimposed No midline shift, ventriculomegaly, mass effect, evidence of mass lesion, intracranial hemorrhage or evidence of cortically based acute infarction. Vascular: Calcified atherosclerosis at the skull base. No suspicious intracranial vascular hyperdensity. Skull: Negative. Sinuses/Orbits: Visualized paranasal sinuses and mastoids are clear. Other: Visualized orbits and scalp soft tissues are within normal limits. IMPRESSION: Chronic Right PCA infarct.  No acute intracranial abnormality. Electronically Signed   By: Odessa Fleming M.D.   On: 07/30/2021 06:17   CT Angio Chest Pulmonary Embolism (PE) W or WO Contrast  Result Date: 07/30/2021 CLINICAL DATA:  Nausea.  Concern pulmonary embolism. EXAM: CT ANGIOGRAPHY CHEST WITH CONTRAST TECHNIQUE: Multidetector CT imaging of the chest was performed using the standard protocol during bolus administration of intravenous contrast. Multiplanar CT image reconstructions and MIPs were obtained to evaluate the vascular anatomy. CONTRAST:  80mL OMNIPAQUE IOHEXOL 350 MG/ML SOLN COMPARISON:  None. FINDINGS: Cardiovascular: No filling defects within the pulmonary arteries to suggest acute pulmonary embolism. Mediastinum/Nodes: No axillary or  supraclavicular adenopathy. No mediastinal or hilar adenopathy. No pericardial fluid. Esophagus normal. Lungs/Pleura: No pulmonary infarction. No pneumonia. No pleural fluid. Upper Abdomen: Low-attenuation liver suggests hepatic steatosis. Musculoskeletal: Review of the MIP images confirms the above findings. IMPRESSION: 1.  No evidence of pulmonary embolism. 2. No evidence of pneumonia or pulmonary infarction. 3. Hepatic steatosis. Electronically Signed   By: Genevive Bi M.D.   On: 07/30/2021 06:20   CT ABDOMEN PELVIS W CONTRAST  Result Date: 07/30/2021 CLINICAL DATA:  Abdominal pain, acute, nonlocalized EXAM: CT ABDOMEN AND PELVIS WITH CONTRAST TECHNIQUE: Multidetector CT imaging of the abdomen and pelvis was performed using the standard protocol following bolus administration of intravenous contrast. CONTRAST:  80mL OMNIPAQUE IOHEXOL 350 MG/ML SOLN COMPARISON:  None. FINDINGS: Patient movement degrades imaging Lower chest: Lung bases are clear. Hepatobiliary: No focal hepatic lesion. Postcholecystectomy. No biliary dilatation. Low-attenuation liver suggests hepatic steatosis. Some streak artifact patient's arms at side. Pancreas: Pancreas is normal. No ductal dilatation. No pancreatic inflammation. Spleen: Normal spleen Adrenals/urinary tract: Adrenal glands are normal. No hydronephrosis. Fat density lesion lower pole of the LEFT kidney measures 8 mm (image 44/2). Ureters and bladder normal. Stomach/Bowel: Small hiatal hernia. Stomach, small bowel, appendix, and cecum are normal. The colon and rectosigmoid colon are normal. Vascular/Lymphatic: Abdominal aorta is normal caliber. No periportal or retroperitoneal adenopathy. No pelvic adenopathy. Reproductive: Uterus and adnexa unremarkable. Other: No free fluid. 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 No change from prior. IMPRESSION: 1. No acute findings in the abdomen pelvis. 2. No evidence of bowel obstruction. 3.  Postcholecystectomy without complication. 4. Probable hepatic steatosis. 5. Small RIGHT renal benign angiomyolipoma. Electronically Signed   By: Genevive Bi M.D.   On: 07/30/2021 06:14   DG Chest Port 1 View  Result Date: 07/30/2021 CLINICAL DATA:  Nausea, questionable sepsis EXAM: PORTABLE CHEST 1 VIEW COMPARISON:  04/19/2021 FINDINGS: Low lung volumes. Lungs are clear.  No pleural effusion or pneumothorax. The heart is normal in size. IMPRESSION: No evidence of acute cardiopulmonary disease. Electronically Signed   By: Charline Bills M.D.   On: 07/30/2021 03:22

## 2021-07-31 ENCOUNTER — Inpatient Hospital Stay (HOSPITAL_COMMUNITY): Payer: BC Managed Care – PPO

## 2021-07-31 DIAGNOSIS — G9340 Encephalopathy, unspecified: Secondary | ICD-10-CM | POA: Diagnosis not present

## 2021-07-31 DIAGNOSIS — L8990 Pressure ulcer of unspecified site, unspecified stage: Secondary | ICD-10-CM | POA: Diagnosis present

## 2021-07-31 LAB — CBC
HCT: 38.2 % (ref 36.0–46.0)
Hemoglobin: 12.3 g/dL (ref 12.0–15.0)
MCH: 30.1 pg (ref 26.0–34.0)
MCHC: 32.2 g/dL (ref 30.0–36.0)
MCV: 93.6 fL (ref 80.0–100.0)
Platelets: 214 10*3/uL (ref 150–400)
RBC: 4.08 MIL/uL (ref 3.87–5.11)
RDW: 15.8 % — ABNORMAL HIGH (ref 11.5–15.5)
WBC: 13.4 10*3/uL — ABNORMAL HIGH (ref 4.0–10.5)
nRBC: 0.1 % (ref 0.0–0.2)

## 2021-07-31 LAB — TRIGLYCERIDES: Triglycerides: 75 mg/dL (ref <150)

## 2021-07-31 LAB — BASIC METABOLIC PANEL
Anion gap: 16 — ABNORMAL HIGH (ref 5–15)
Anion gap: 18 — ABNORMAL HIGH (ref 5–15)
BUN: 9 mg/dL (ref 6–20)
BUN: 6 mg/dL (ref 6–20)
CO2: 15 mmol/L — ABNORMAL LOW (ref 22–32)
CO2: 21 mmol/L — ABNORMAL LOW (ref 22–32)
Calcium: 9.4 mg/dL (ref 8.9–10.3)
Calcium: 9.5 mg/dL (ref 8.9–10.3)
Chloride: 114 mmol/L — ABNORMAL HIGH (ref 98–111)
Chloride: 105 mmol/L (ref 98–111)
Creatinine, Ser: 1.01 mg/dL — ABNORMAL HIGH (ref 0.44–1.00)
Creatinine, Ser: 0.85 mg/dL (ref 0.44–1.00)
GFR, Estimated: >60 mL/min (ref >60)
GFR, Estimated: >60 mL/min (ref >60)
Glucose, Bld: 95 mg/dL (ref 70–99)
Glucose, Bld: 102 mg/dL — ABNORMAL HIGH (ref 70–99)
Potassium: 3.1 mmol/L — ABNORMAL LOW (ref 3.5–5.1)
Potassium: 2.9 mmol/L — ABNORMAL LOW (ref 3.5–5.1)
Sodium: 145 mmol/L (ref 135–145)
Sodium: 144 mmol/L (ref 135–145)

## 2021-07-31 LAB — MAGNESIUM: Magnesium: 2 mg/dL (ref 1.7–2.4)

## 2021-07-31 LAB — URINE CULTURE: Culture: NO GROWTH

## 2021-07-31 LAB — PHOSPHORUS: Phosphorus: <1 mg/dL — CL (ref 2.5–4.6)

## 2021-07-31 LAB — GLUCOSE, CAPILLARY: Glucose-Capillary: 95 mg/dL (ref 70–99)

## 2021-07-31 LAB — SARS CORONAVIRUS 2 (TAT 6-24 HRS): SARS Coronavirus 2: NEGATIVE

## 2021-07-31 MED ORDER — HYDRALAZINE HCL 20 MG/ML IJ SOLN
10.0000 mg | INTRAMUSCULAR | Status: DC | PRN
  Administered 2021-08-01 – 2021-08-02 (×4): 10 mg via INTRAVENOUS
  Filled 2021-07-31 (×4): qty 1

## 2021-07-31 MED ORDER — LABETALOL HCL 5 MG/ML IV SOLN
20.0000 mg | INTRAVENOUS | Status: DC | PRN
  Administered 2021-07-31 – 2021-08-02 (×10): 20 mg via INTRAVENOUS
  Filled 2021-07-31 (×10): qty 4

## 2021-07-31 MED ORDER — SODIUM CHLORIDE 0.9 % IV SOLN
INTRAVENOUS | Status: DC | PRN
  Administered 2021-07-31: 250 mL via INTRAVENOUS
  Administered 2021-07-31: 500 mL via INTRAVENOUS

## 2021-07-31 MED ORDER — POTASSIUM PHOSPHATES 15 MMOLE/5ML IV SOLN
30.0000 mmol | Freq: Once | INTRAVENOUS | Status: AC
  Administered 2021-07-31: 30 mmol via INTRAVENOUS
  Filled 2021-07-31: qty 10

## 2021-07-31 MED ORDER — POTASSIUM CHLORIDE CRYS ER 20 MEQ PO TBCR
40.0000 meq | EXTENDED_RELEASE_TABLET | Freq: Once | ORAL | Status: DC
  Filled 2021-07-31: qty 2

## 2021-07-31 MED ORDER — POTASSIUM CHLORIDE 10 MEQ/100ML IV SOLN
10.0000 meq | INTRAVENOUS | Status: AC
  Administered 2021-07-31 (×2): 10 meq via INTRAVENOUS
  Filled 2021-07-31 (×2): qty 100

## 2021-07-31 MED ORDER — POTASSIUM CHLORIDE 10 MEQ/100ML IV SOLN
10.0000 meq | INTRAVENOUS | Status: AC
Start: 2021-07-31 — End: 2021-08-01
  Administered 2021-07-31 – 2021-08-01 (×4): 10 meq via INTRAVENOUS
  Filled 2021-07-31 (×4): qty 100

## 2021-07-31 MED ORDER — POTASSIUM CHLORIDE 10 MEQ/100ML IV SOLN
10.0000 meq | INTRAVENOUS | Status: AC
  Administered 2021-08-01 (×2): 10 meq via INTRAVENOUS
  Filled 2021-07-31 (×2): qty 100

## 2021-07-31 NOTE — Progress Notes (Addendum)
eLink Physician-Brief Progress Note Patient Name: Holly Roberts DOB: [birth year] MRN: [id number]   Date of Service  07/31/2021  HPI/Events of Note  Notified of hypertension with BP 169/93, HR 113. Pt remains drowsy. Utox negative, alcohols negative.  eICU Interventions  Give labetalol 20mg  IV prn.     Intervention Category Intermediate Interventions: Hypertension - evaluation and management  07/31/2021, 12:05 AM  4:53 AM Notified of hypokalemia with K 3.1, low phos <1, crea 1.01.  Pt is opening her eyes intermittently.  She is restless in bed.   Plan> Give 08/02/2021 of Kphos and additional Kcl.

## 2021-07-31 NOTE — Progress Notes (Signed)
[facility] ADULT ICU REPLACEMENT PROTOCOL   The patient does apply for the [facility] Adult ICU Electrolyte Replacment Protocol based on the criteria listed below:   1.Exclusion criteria: TCTS patients, ECMO patients and Hypothermia Protocol, and   Dialysis patients 2. Is GFR >/= 30 ml/min? Yes.    Patient's GFR today is >60 3. Is SCr </= 2? No. Patient's SCr is 0.85 mg/dL 4. Did SCr increase >/= 0.5 in 24 hours? No. 5.Pt's weight >40kg  Yes.   6. Abnormal electrolyte(s): K 2.9  7. Electrolytes replaced per protocol   Ardelle Park 07/31/2021 9:36 PM

## 2021-07-31 NOTE — Progress Notes (Signed)
eLink Physician-Brief Progress Note Patient Name: Holly Roberts DOB: 08-22-1966 MRN: [id number]   Date of Service  07/31/2021  HPI/Events of Note  Notified of BP 184.91 despite prn labetalol  eICU Interventions  Ordered prn hydralazine 10 mg IV for SBP > 170     Intervention Category Major Interventions: Hypertension - evaluation and management  Darl Pikes 07/31/2021, 11:24 PM

## 2021-07-31 NOTE — Progress Notes (Signed)
eLink Physician-Brief Progress Note Patient Name: Holly Roberts DOB: 02/18/1966 MRN: [id number]   Date of Service  07/31/2021  HPI/Events of Note  Labs reviewed. Ordered tonight due to concern for refeeding syndrome. Currently feeding on hold a patient pulled out NGT. K 2.9. Phos < 1 this morning.  eICU Interventions  Electrolytes to be replaced and rechecked as per Elink electrolyte replacement protocol Discussed with Elink RN     Intervention Category Major Interventions: Electrolyte abnormality - evaluation and management  Darl Pikes 07/31/2021, 9:26 PM

## 2021-07-31 NOTE — Progress Notes (Signed)
NAME:  Holly Roberts, MRN:  [id number], DOB:  15-Feb-1966, LOS: 1 ADMISSION DATE:  07/29/2021, CONSULTATION DATE:  07/29/2021  REFERRING MD:  Dr Pilar Plate of [facility], CHIEF COMPLAINT:  Acute encephalopathy and severe metabolic acidosis   History of Present Illness: from EDP and review of records. Patient unable to give hx. No family at bedside   55 year old obese female with significant bipolar disease, anxiety, depression and on multiple CNS medications listed below, along with chronic pain syndrome for which she is on gabapentin.  Originally admitted 04/20/2021 for acute cholecystitis.  Status post lap cholecystectomy 04/21/2021.  Postop course complicated by postcholecystectomy diarrhea.  She started on cholestyramine but it is no longer listed in the medical history.  She was then readmitted 06/09/2021 with 4-day history of vomiting and diarrhea and very poor oral intake, this was associated with a lipase of 263 and acute kidney injury with abnormal UA WBC greater than 50 creatinine 2.65 BUN 45, high anion gap metabolic acidosis greater than 20 with an albumin of 4.7, hypoglycemia, mild hyperbilirubinemia 2.2.  Her illness resolved with fluid therapy and her lab normalities resolved at the time of discharge 06/10/2021.  Then on 07/24/2021 she present to the ER with emesis and abdominal pain, diarrhea.  Anion gap again elevated to 16 [no acute kidney injury].  Symptoms resolved with fluids and she was discharged  She represented again on 07/29/2021 to the emergency department with significant nausea, decreased p.o. intake, reports of noncompliance with diet [?  Noncompliance with medications] was found to be in agitated encephalopathy [new compared to June 2022] with significant SIRS physiology [tachycardic tachypneic but afebrile and normal pulse ox] has severe anion gap metabolic acidosis with a gap of 28 and a high albumin of 5.5 [similar to June 2022] slightly elevated ammonia level but negative salicylate,  negative lactic acid negative Tylenol level and normal kidney function [in June 2022 she had acute kidney injury for similar presentation].  QTC and QRS normal. Serum OSM pending. Per daughter: patient has hx of poor water intake and overall food intake is lower after lap chole. Known to not eat/drink repeatedly for days on end esp currently 1 week prior to admit. Definitely was STARTVING prior week including no meds. Was vomiting a lot for 1 week prior to admit Daughter denied antifreeze    EDP started fomepizole after discussing with poison control.  Patient received 3 A of bicarb.  Given 1 dose of empiric antibiotic.  And 1 L of fluid  Results for Holly, Roberts (MRN [id number]) as of 07/30/2021 08:14  Ref. Range 07/30/2021 02:29  Alcohol, Ethyl (B) Latest Ref Range: <10 mg/dL <78  Salicylate Lvl Latest Ref Range: 7.0 - 30.0 mg/dL <2.9 (L)   Results for Holly, Roberts (MRN [id number]) as of 07/30/2021 08:14  Ref. Range 07/30/2021 02:29  Acetaminophen (Tylenol), S Latest Ref Range: 10 - 30 ug/mL <10 (L)    Or armodafinil, doxepin, Horizant, Nucynta, Trintellix and Vraylar  Pertinent  Medical History     has a past medical history of Anxiety, Bipolar disorder (HCC), Chronic pain, Depression, and Hypertension.   reports that she has never smoked. She has never used smokeless tobacco.  Past Surgical History:  Procedure Laterality Date   CHOLECYSTECTOMY N/A 04/20/2021   Procedure: LAPAROSCOPIC CHOLECYSTECTOMY WITH INTRAOPERATIVE CHOLANGIOGRAM;  Surgeon: [name], MD;  Location: [facility];  Service: General;  Laterality: N/A;   Right knee surgery     TONSILLECTOMY      Allergies  Allergen  Reactions   Buprenorphine Rash    Patch gave rash- poss adhesive issues. Tried generic and brand name both. Patient states: "I am not allergic to the medication. I am allergic to the adhesive, not the medication."    Amoxicillin-Pot Clavulanate     Other reaction(s): Other (See Comments) States  "strong" antibiotics cause C diff   Wound Dressing Adhesive     Rash   Sulfa Antibiotics Rash    Immunization History  Administered Date(s) Administered   PFIZER(Purple Top)SARS-COV-2 Vaccination 09/10/2020, 10/01/2020    Family History  Problem Relation Age of Onset   Cancer Father      Current Facility-Administered Medications:    0.9 %  sodium chloride infusion, , Intravenous, PRN, [name], MD, Last Rate: 10 mL/hr at 07/31/21 0835, Infusion Verify at 07/31/21 0835   chlorhexidine (PERIDEX) 0.12 % solution 15 mL, 15 mL, Mouth Rinse, BID, Ramaswamy, Murali, MD, 15 mL at 07/30/21 2158   Chlorhexidine Gluconate Cloth 2 % PADS 6 each, 6 each, Topical, Daily, Ramaswamy, Murali, MD, 6 each at 07/31/21 0530   docusate sodium (COLACE) capsule 100 mg, 100 mg, Oral, BID PRN, [name], MD   fentaNYL (SUBLIMAZE) injection 25 mcg, 25 mcg, Intravenous, Q1H PRN, [name], MD, 25 mcg at 07/31/21 0414   folic acid injection 1 mg, 1 mg, Intravenous, Q12H, Ramaswamy, Murali, MD, 1 mg at 07/30/21 2139   heparin injection 5,000 Units, 5,000 Units, Subcutaneous, Q8H, Ramaswamy, Carmin Muskrat, MD, 5,000 Units at 07/31/21 4098   labetalol (NORMODYNE) injection 20 mg, 20 mg, Intravenous, Q4H PRN, Larinda Buttery, MD, 20 mg at 07/31/21 0504   MEDLINE mouth rinse, 15 mL, Mouth Rinse, q12n4p, Ramaswamy, Murali, MD, 15 mL at 07/30/21 1122   pantoprazole (PROTONIX) injection 40 mg, 40 mg, Intravenous, QHS, Ramaswamy, Murali, MD, 40 mg at 07/30/21 2156   polyethylene glycol (MIRALAX / GLYCOLAX) packet 17 g, 17 g, Oral, Daily PRN, Marchelle Gearing, Murali, MD   potassium PHOSPHATE 30 mmol in dextrose 5 % 500 mL infusion, 30 mmol, Intravenous, Once, Larinda Buttery, MD, Last Rate: 85 mL/hr at 07/31/21 0835, Infusion Verify at 07/31/21 0835   potassium PHOSPHATE 30 mmol in dextrose 5 % 500 mL infusion, 30 mmol, Intravenous, Once, Nathasha Fiorillo, Lesia Sago, MD   sodium bicarbonate 150 mEq in dextrose 5 % 1,150 mL  infusion, , Intravenous, Continuous, Ramaswamy, Murali, MD, Last Rate: 125 mL/hr at 07/31/21 0835, Infusion Verify at 07/31/21 0835   thiamine (B-1) injection 100 mg, 100 mg, Intravenous, Q12H, Ramaswamy, Murali, MD, 100 mg at 07/30/21 2145   Significant Hospital Events: Including procedures, antibiotic start and stop dates in addition to other pertinent events   07/29/2021 - er 8/14 - seen by CCM at [facility] ER bed 03 8/15 refeeding on D5, attends briefly, mumbles responses, very inattentive, some labs improving  Interim History / Subjective:  NAEON. Refeeding. Encephalopathic but attends briefly.   Objective   Blood pressure (!) 165/95, pulse 96, temperature 98.3 F (36.8 C), temperature source Axillary, resp. rate (!) 27, weight 114.9 kg, SpO2 100 %.        Intake/Output Summary (Last 24 hours) at 07/31/2021 0856 Last data filed at 07/31/2021 0835 Gross per 24 hour  Intake 4113.79 ml  Output 1150 ml  Net 2963.79 ml    Filed Weights   07/31/21 0500  Weight: 114.9 kg    Examination: General: Obese, On bed in ER  HENT: Looks dry oral mucoasa, poor dentitiion. No elevated JVP. No neck nodes Lungs: CTA bialterally  but tachyepneic. Not paradoxical Cardiovascular: Tachcyardic. Normal heart sounds. Normal QTc Abdomen: obese Extremities: No cyanosis. No clubbing. No edema Neuro: Prior agitated. Now drowsy, no evidence of clonus.  No evidence of dilated pupil no muscular rigidity GU: Not examined  Resolved Hospital Problem list   x  Assessment & Plan:   Nausea and Vomiting: Suspect related to acidosis. CT AP ok. Seems improved. --NG for TF --PRN anti-emetics  Severe anion gap metabolic acidosis due to starvation ketosis: in setting of poor PO intake. --Normal lactic acid, normal salicylate, normal ethanol level and normal Tylenol level --similar feature in June 2022 admission but at that time did not have encephalopathy but had acute kidney injury --thiamine, folate --D5,  start TF  AKI - present on admission. Due to hypovolemia. Cr improved with fluids. --continue IVF  Depression, Bipolar and anxiety, chronic pain:  --resume home medications including armodafinil, doxepin, Horizant, Nucynta, Trintellix and Vraylar -- EKG 07/30/2021: Normal QRS and EKG at admission  Refeeding: Phos undetectable, K low --K phos IV --K supplementation  Toxic Metabolic encephalopathy: Due to ketosis, off home meds --feed --resume home meds   Best Practice (right click and "Reselect all SmartList Selections" daily)   Diet/type: NPO DVT prophylaxis: prophylactic heparin  GI prophylaxis: PPI Lines: N/A Foley:  N/A Code Status:  full code Last date of multidisciplinary goals of care discussion [n/a]     Critical Care   CRITICAL CARE N/a   I spent 40 minutes in care of the patient on day of service including review of medical record, face-to-face evaluation, coordination of care. 07/31/2021 8:56 AM     LABS    PULMONARY Recent Labs  Lab 07/30/21 0420 07/30/21 1116 07/30/21 1659  PHART  --  7.194* 7.245*  PCO2ART  --  24.4* 21.5*  PO2ART  --  44.7* 123*  HCO3 5.5* 9.0* 9.0*  O2SAT 71.5 75.1 98.2     CBC Recent Labs  Lab 07/30/21 0843 07/30/21 1705 07/30/21 1717 07/31/21 0312  HGB 13.2  --  12.5 12.3  HCT 41.9  --  40.5 38.2  WBC 21.0*  --  18.7* 13.4*  PLT 302 267 253 214     COAGULATION Recent Labs  Lab 07/30/21 0226 07/30/21 0843 07/30/21 1705  INR 1.3* 1.3* 1.3*     CARDIAC  No results for input(s): TROPONINI in the last 168 hours. No results for input(s): PROBNP in the last 168 hours.   CHEMISTRY Recent Labs  Lab 07/24/21 0953 07/30/21 0226 07/30/21 0843 07/30/21 1717 07/31/21 0312  NA 141 140 143 147* 145  K 4.1 3.5 3.6 3.4* 3.1*  CL 98 105 105 114* 114*  CO2 27 7* 9* 11* 15*  GLUCOSE 86 116* 112* 100* 95  BUN 12 20 20 14 9   CREATININE 0.71 1.26* 1.10* 0.98 1.01*  CALCIUM 10.1 10.4* 9.8 9.5 9.4  MG  --    --  2.2  --  2.0  PHOS  --   --  2.9  --  <1.0*    Estimated Creatinine Clearance: 78.3 mL/min (A) (by C-G formula based on SCr of 1.01 mg/dL (H)).   LIVER Recent Labs  Lab 07/24/21 0953 07/30/21 0226 07/30/21 0843 07/30/21 1705  AST 20 23 19   --   ALT 18 27 22   --   ALKPHOS 125 141* 128*  --   BILITOT 0.7 1.8* 2.0*  --   PROT 8.0 9.1* 8.0  --   ALBUMIN 4.7 5.5* 4.9  --  INR  --  1.3* 1.3* 1.3*      INFECTIOUS Recent Labs  Lab 07/30/21 0226 07/30/21 0842  LATICACIDVEN 1.5 0.8      ENDOCRINE CBG (last 3)  Recent Labs    07/30/21 0442 07/30/21 2111  GLUCAP 95 107*          IMAGING x48h  - image(s) personally visualized  -   highlighted in bold CT ABDOMEN PELVIS WO CONTRAST  Result Date: 07/30/2021 CLINICAL DATA:  Abdominal pain and elevated lipase, unremarkable CT earlier in the same day. EXAM: CT ABDOMEN AND PELVIS WITHOUT CONTRAST TECHNIQUE: Multidetector CT imaging of the abdomen and pelvis was performed following the standard protocol without IV contrast. Examination is significantly limited by patient motion artifact. COMPARISON:  07/30/2021 FINDINGS: Lower chest: No acute abnormality is noted in the bases bilaterally. Hepatobiliary: No focal liver abnormality is seen. Status post cholecystectomy. No biliary dilatation. Pancreas: Pancreas shows no mass lesion or ductal obstruction. Some very minimal peripancreatic inflammatory changes are seen which may represent some mild acute pancreatitis. These changes have progressed slightly in the interval from the prior examination this morning. Spleen: Normal in size without focal abnormality. Adrenals/Urinary Tract: Adrenal glands are within normal limits. Kidneys show no renal calculi. Some residual contrast is seen within the right collecting system as well as the bladder. 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 Stomach/Bowel: Colon shows no obstructive or inflammatory changes. The  appendix is not well seen although no inflammatory changes are seen. Small bowel and stomach are within normal limits. Vascular/Lymphatic: Aortic atherosclerosis. No enlarged abdominal or pelvic lymph nodes. Reproductive: Uterus and bilateral adnexa are unremarkable. Other: No abdominal wall hernia or abnormality. No abdominopelvic ascites. Musculoskeletal: No acute or significant osseous findings. IMPRESSION: Very minimal peripancreatic inflammatory change near the head of the pancreas. No significant phlegmon is noted. These changes are likely related to very mild acute appendicitis. Chronic changes similar to that seen on the prior exam. Examination is somewhat limited due to motion artifact. Electronically Signed   By: Alcide Clever M.D.   On: 07/30/2021 17:06   CT HEAD WO CONTRAST ( )  Result Date: 07/30/2021 CLINICAL DATA:  55 year old female with nausea, decreased p.o. Delirium, combative. EXAM: CT HEAD WITHOUT CONTRAST TECHNIQUE: Contiguous axial images were obtained from the base of the skull through the vertex without intravenous contrast. COMPARISON:  None. FINDINGS: Brain: Chronic encephalomalacia right inferior occipital lobe. Associated mild ex vacuo enlargement of the right occipital horn. Elsewhere cerebral volume is preserved, gray-white matter differentiation within normal limits. No superimposed No midline shift, ventriculomegaly, mass effect, evidence of mass lesion, intracranial hemorrhage or evidence of cortically based acute infarction. Vascular: Calcified atherosclerosis at the skull base. No suspicious intracranial vascular hyperdensity. Skull: Negative. Sinuses/Orbits: Visualized paranasal sinuses and mastoids are clear. Other: Visualized orbits and scalp soft tissues are within normal limits. IMPRESSION: Chronic Right PCA infarct.  No acute intracranial abnormality. Electronically Signed   By: Odessa Fleming M.D.   On: 07/30/2021 06:17   CT Angio Chest Pulmonary Embolism (PE) W or WO  Contrast  Result Date: 07/30/2021 CLINICAL DATA:  Nausea.  Concern pulmonary embolism. EXAM: CT ANGIOGRAPHY CHEST WITH CONTRAST TECHNIQUE: Multidetector CT imaging of the chest was performed using the standard protocol during bolus administration of intravenous contrast. Multiplanar CT image reconstructions and MIPs were obtained to evaluate the vascular anatomy. CONTRAST:  80mL OMNIPAQUE IOHEXOL 350 MG/ML SOLN COMPARISON:  None. FINDINGS: Cardiovascular: No filling defects within the pulmonary  arteries to suggest acute pulmonary embolism. Mediastinum/Nodes: No axillary or supraclavicular adenopathy. No mediastinal or hilar adenopathy. No pericardial fluid. Esophagus normal. Lungs/Pleura: No pulmonary infarction. No pneumonia. No pleural fluid. Upper Abdomen: Low-attenuation liver suggests hepatic steatosis. Musculoskeletal: Review of the MIP images confirms the above findings. IMPRESSION: 1. No evidence of pulmonary embolism. 2. No evidence of pneumonia or pulmonary infarction. 3. Hepatic steatosis. Electronically Signed   By: Genevive Bi M.D.   On: 07/30/2021 06:20   CT ABDOMEN PELVIS W CONTRAST  Result Date: 07/30/2021 CLINICAL DATA:  Abdominal pain, acute, nonlocalized EXAM: CT ABDOMEN AND PELVIS WITH CONTRAST TECHNIQUE: Multidetector CT imaging of the abdomen and pelvis was performed using the standard protocol following bolus administration of intravenous contrast. CONTRAST:  80mL OMNIPAQUE IOHEXOL 350 MG/ML SOLN COMPARISON:  None. FINDINGS: Patient movement degrades imaging Lower chest: Lung bases are clear. Hepatobiliary: No focal hepatic lesion. Postcholecystectomy. No biliary dilatation. Low-attenuation liver suggests hepatic steatosis. Some streak artifact patient's arms at side. Pancreas: Pancreas is normal. No ductal dilatation. No pancreatic inflammation. Spleen: Normal spleen Adrenals/urinary tract: Adrenal glands are normal. No hydronephrosis. Fat density lesion lower pole of the LEFT  kidney measures 8 mm (image 44/2). Ureters and bladder normal. Stomach/Bowel: Small hiatal hernia. Stomach, small bowel, appendix, and cecum are normal. The colon and rectosigmoid colon are normal. Vascular/Lymphatic: Abdominal aorta is normal caliber. No periportal or retroperitoneal adenopathy. No pelvic adenopathy. Reproductive: Uterus and adnexa unremarkable. Other: No free fluid. 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 No change from prior. IMPRESSION: 1. No acute findings in the abdomen pelvis. 2. No evidence of bowel obstruction. 3. Postcholecystectomy without complication. 4. Probable hepatic steatosis. 5. Small RIGHT renal benign angiomyolipoma. Electronically Signed   By: Genevive Bi M.D.   On: 07/30/2021 06:14   DG Chest Port 1 View  Result Date: 07/30/2021 CLINICAL DATA:  Nausea, questionable sepsis EXAM: PORTABLE CHEST 1 VIEW COMPARISON:  04/19/2021 FINDINGS: Low lung volumes. Lungs are clear.  No pleural effusion or pneumothorax. The heart is normal in size. IMPRESSION: No evidence of acute cardiopulmonary disease. Electronically Signed   By: Charline Bills M.D.   On: 07/30/2021 03:22

## 2021-08-01 DIAGNOSIS — E872 Acidosis: Principal | ICD-10-CM

## 2021-08-01 LAB — BASIC METABOLIC PANEL
Anion gap: 17 — ABNORMAL HIGH (ref 5–15)
BUN: <5 mg/dL — ABNORMAL LOW (ref 6–20)
CO2: 24 mmol/L (ref 22–32)
Calcium: 8.9 mg/dL (ref 8.9–10.3)
Chloride: 102 mmol/L (ref 98–111)
Creatinine, Ser: 0.67 mg/dL (ref 0.44–1.00)
GFR, Estimated: >60 mL/min (ref >60)
Glucose, Bld: 104 mg/dL — ABNORMAL HIGH (ref 70–99)
Potassium: 3.2 mmol/L — ABNORMAL LOW (ref 3.5–5.1)
Sodium: 143 mmol/L (ref 135–145)

## 2021-08-01 LAB — CBC
HCT: 34.8 % — ABNORMAL LOW (ref 36.0–46.0)
Hemoglobin: 12 g/dL (ref 12.0–15.0)
MCH: 29.7 pg (ref 26.0–34.0)
MCHC: 34.5 g/dL (ref 30.0–36.0)
MCV: 86.1 fL (ref 80.0–100.0)
Platelets: 204 10*3/uL (ref 150–400)
RBC: 4.04 MIL/uL (ref 3.87–5.11)
RDW: 15.5 % (ref 11.5–15.5)
WBC: 7.3 10*3/uL (ref 4.0–10.5)
nRBC: 0 % (ref 0.0–0.2)

## 2021-08-01 LAB — C DIFFICILE (CDIFF) QUICK SCRN (NO PCR REFLEX)
C Diff antigen: NEGATIVE
C Diff interpretation: NOT DETECTED
C Diff toxin: NEGATIVE

## 2021-08-01 LAB — PHOSPHORUS: Phosphorus: 1.9 mg/dL — ABNORMAL LOW (ref 2.5–4.6)

## 2021-08-01 LAB — MAGNESIUM: Magnesium: 1.6 mg/dL — ABNORMAL LOW (ref 1.7–2.4)

## 2021-08-01 MED ORDER — AMLODIPINE BESYLATE 10 MG PO TABS
10.0000 mg | ORAL_TABLET | Freq: Every day | ORAL | Status: DC
  Administered 2021-08-01 – 2021-08-10 (×10): 10 mg via ORAL
  Filled 2021-08-01 (×10): qty 1

## 2021-08-01 MED ORDER — VORTIOXETINE HBR 5 MG PO TABS
20.0000 mg | ORAL_TABLET | Freq: Every day | ORAL | Status: DC
  Administered 2021-08-01 – 2021-08-10 (×11): 20 mg via ORAL
  Filled 2021-08-01 (×10): qty 4

## 2021-08-01 MED ORDER — CARIPRAZINE HCL 1.5 MG PO CAPS
6.0000 mg | ORAL_CAPSULE | Freq: Every day | ORAL | Status: DC
  Administered 2021-08-01 – 2021-08-10 (×10): 6 mg via ORAL
  Filled 2021-08-01 (×11): qty 4

## 2021-08-01 MED ORDER — POTASSIUM CHLORIDE 10 MEQ/100ML IV SOLN
10.0000 meq | INTRAVENOUS | Status: AC
Start: 2021-08-01 — End: 2021-08-01
  Administered 2021-08-01 (×2): 10 meq via INTRAVENOUS
  Filled 2021-08-01 (×2): qty 100

## 2021-08-01 MED ORDER — MAGNESIUM SULFATE 4 GM/100ML IV SOLN
4.0000 g | Freq: Once | INTRAVENOUS | Status: AC
  Administered 2021-08-01: 4 g via INTRAVENOUS
  Filled 2021-08-01: qty 100

## 2021-08-01 MED ORDER — GERHARDT'S BUTT CREAM
TOPICAL_CREAM | CUTANEOUS | Status: DC | PRN
  Filled 2021-08-01 (×2): qty 1

## 2021-08-01 MED ORDER — POTASSIUM PHOSPHATES 15 MMOLE/5ML IV SOLN
30.0000 mmol | Freq: Once | INTRAVENOUS | Status: AC
  Administered 2021-08-01: 30 mmol via INTRAVENOUS
  Filled 2021-08-01: qty 10

## 2021-08-01 MED ORDER — MELATONIN 3 MG PO TABS
3.0000 mg | ORAL_TABLET | Freq: Every evening | ORAL | Status: AC | PRN
Start: 2021-08-01 — End: 2021-08-04
  Administered 2021-08-02 – 2021-08-04 (×3): 3 mg via ORAL
  Filled 2021-08-01 (×3): qty 1

## 2021-08-01 MED ORDER — CARIPRAZINE HCL 6 MG PO CAPS: 6.0000 mg | ORAL_CAPSULE | Freq: Every day | ORAL | Status: DC

## 2021-08-01 MED ORDER — LOPERAMIDE HCL 2 MG PO CAPS
2.0000 mg | ORAL_CAPSULE | Freq: Four times a day (QID) | ORAL | Status: DC | PRN
  Administered 2021-08-02 – 2021-08-06 (×2): 2 mg via ORAL
  Filled 2021-08-01 (×2): qty 1

## 2021-08-01 NOTE — Progress Notes (Addendum)
NAME:  Holly Roberts, MRN:  [id number], DOB:  Jan 03, 1966, LOS: 2 ADMISSION DATE:  07/29/2021, CONSULTATION DATE:  07/29/2021  REFERRING MD:  Dr Pilar Plate of [facility], CHIEF COMPLAINT:  Acute encephalopathy and severe metabolic acidosis   History of Present Illness: from EDP and review of records. Patient unable to give hx. No family at bedside   55 year old obese female with significant bipolar disease, anxiety, depression and on multiple CNS medications listed below, along with chronic pain syndrome for which she is on gabapentin.  Originally admitted 04/20/2021 for acute cholecystitis.  Status post lap cholecystectomy 04/21/2021.  Postop course complicated by postcholecystectomy diarrhea.  She started on cholestyramine but it is no longer listed in the medical history.  She was then readmitted 06/09/2021 with 4-day history of vomiting and diarrhea and very poor oral intake, this was associated with a lipase of 263 and acute kidney injury with abnormal UA WBC greater than 50 creatinine 2.65 BUN 45, high anion gap metabolic acidosis greater than 20 with an albumin of 4.7, hypoglycemia, mild hyperbilirubinemia 2.2.  Her illness resolved with fluid therapy and her lab normalities resolved at the time of discharge 06/10/2021.  Then on 07/24/2021 she present to the ER with emesis and abdominal pain, diarrhea.  Anion gap again elevated to 16 [no acute kidney injury].  Symptoms resolved with fluids and she was discharged  She represented again on 07/29/2021 to the emergency department with significant nausea, decreased p.o. intake, reports of noncompliance with diet [?  Noncompliance with medications] was found to be in agitated encephalopathy [new compared to June 2022] with significant SIRS physiology [tachycardic tachypneic but afebrile and normal pulse ox] has severe anion gap metabolic acidosis with a gap of 28 and a high albumin of 5.5 [similar to June 2022] slightly elevated ammonia level but negative salicylate,  negative lactic acid negative Tylenol level and normal kidney function [in June 2022 she had acute kidney injury for similar presentation].  QTC and QRS normal. Serum OSM pending. Per daughter: patient has hx of poor water intake and overall food intake is lower after lap chole. Known to not eat/drink repeatedly for days on end esp currently 1 week prior to admit. Definitely was STARTVING prior week including no meds. Was vomiting a lot for 1 week prior to admit Daughter denied antifreeze    EDP started fomepizole after discussing with poison control.  Patient received 3 A of bicarb.  Given 1 dose of empiric antibiotic.  And 1 L of fluid  Results for LINDALEE, BOATMAN (MRN [id number]) as of 07/30/2021 08:14  Ref. Range 07/30/2021 02:29  Alcohol, Ethyl (B) Latest Ref Range: <10 mg/dL <78  Salicylate Lvl Latest Ref Range: 7.0 - 30.0 mg/dL <2.9 (L)   Results for YANIKA, CHANNEL (MRN [id number]) as of 07/30/2021 08:14  Ref. Range 07/30/2021 02:29  Acetaminophen (Tylenol), S Latest Ref Range: 10 - 30 ug/mL <10 (L)    Or armodafinil, doxepin, Horizant, Nucynta, Trintellix and Vraylar  Pertinent  Medical History     has a past medical history of Anxiety, Bipolar disorder (HCC), Chronic pain, Depression, and Hypertension.   reports that she has never smoked. She has never used smokeless tobacco.  Past Surgical History:  Procedure Laterality Date   CHOLECYSTECTOMY N/A 04/20/2021   Procedure: LAPAROSCOPIC CHOLECYSTECTOMY WITH INTRAOPERATIVE CHOLANGIOGRAM;  Surgeon: [name], MD;  Location: [facility];  Service: General;  Laterality: N/A;   Right knee surgery     TONSILLECTOMY      Allergies  Allergen  Reactions   Buprenorphine Rash    Patch gave rash- poss adhesive issues. Tried generic and brand name both. Patient states: "I am not allergic to the medication. I am allergic to the adhesive, not the medication."    Amoxicillin-Pot Clavulanate     Other reaction(s): Other (See Comments) States  "strong" antibiotics cause C diff   Wound Dressing Adhesive     Rash   Sulfa Antibiotics Rash    Immunization History  Administered Date(s) Administered   PFIZER(Purple Top)SARS-COV-2 Vaccination 09/10/2020, 10/01/2020    Family History  Problem Relation Age of Onset   Cancer Father      Current Facility-Administered Medications:    0.9 %  sodium chloride infusion, , Intravenous, PRN, [name], MD, Last Rate: 10 mL/hr at 08/01/21 0600, Infusion Verify at 08/01/21 0600   amLODipine (NORVASC) tablet 10 mg, 10 mg, Oral, Daily, Cheralyn Oliver, Lesia Sago, MD   Cariprazine HCl CAPS 6 mg, 6 mg, Oral, Daily, Tahiry Spicer, Lesia Sago, MD   chlorhexidine (PERIDEX) 0.12 % solution 15 mL, 15 mL, Mouth Rinse, BID, Ramaswamy, Murali, MD, 15 mL at 07/31/21 2238   Chlorhexidine Gluconate Cloth 2 % PADS 6 each, 6 each, Topical, Daily, Ramaswamy, Murali, MD, 6 each at 07/31/21 0530   docusate sodium (COLACE) capsule 100 mg, 100 mg, Oral, BID PRN, [name], MD   fentaNYL (SUBLIMAZE) injection 25 mcg, 25 mcg, Intravenous, Q1H PRN, [name], MD, 25 mcg at 08/01/21 1610   folic acid injection 1 mg, 1 mg, Intravenous, Q12H, Ramaswamy, Murali, MD, 1 mg at 07/31/21 2130   heparin injection 5,000 Units, 5,000 Units, Subcutaneous, Q8H, Ramaswamy, Carmin Muskrat, MD, 5,000 Units at 08/01/21 9604   hydrALAZINE (APRESOLINE) injection 10 mg, 10 mg, Intravenous, Q4H PRN, Jeannette Corpus T, MD, 10 mg at 08/01/21 0756   labetalol (NORMODYNE) injection 20 mg, 20 mg, Intravenous, Q4H PRN, Larinda Buttery, MD, 20 mg at 08/01/21 0630   magnesium sulfate IVPB 4 g 100 mL, 4 g, Intravenous, Once, Jeannette Corpus T, MD, Last Rate: 50 mL/hr at 08/01/21 0823, 4 g at 08/01/21 0823   MEDLINE mouth rinse, 15 mL, Mouth Rinse, q12n4p, Ramaswamy, Carmin Muskrat, MD, 15 mL at 07/31/21 1143   pantoprazole (PROTONIX) injection 40 mg, 40 mg, Intravenous, QHS, Ramaswamy, Carmin Muskrat, MD, 40 mg at 07/31/21 2140   polyethylene glycol (MIRALAX /  GLYCOLAX) packet 17 g, 17 g, Oral, Daily PRN, Marchelle Gearing, Carmin Muskrat, MD   potassium chloride 10 mEq in 100 mL IVPB, 10 mEq, Intravenous, Q1 Hr x 2, Aventura, Emily T, MD   potassium PHOSPHATE 30 mmol in dextrose 5 % 500 mL infusion, 30 mmol, Intravenous, Once, Aventura, Emily T, MD   thiamine (B-1) injection 100 mg, 100 mg, Intravenous, Q12H, Ramaswamy, Murali, MD, 100 mg at 07/31/21 2149   vortioxetine HBr (TRINTELLIX) tablet 20 mg, 20 mg, Oral, Daily, Bunyan Brier, Lesia Sago, MD   Significant Hospital Events: Including procedures, antibiotic start and stop dates in addition to other pertinent events   07/29/2021 - er 8/14 - seen by CCM at [facility] ER bed 03 8/15 refeeding on D5, attends briefly, mumbles responses, very inattentive, some labs improving  Interim History / Subjective:  Much more alert. Speech clearer. Still confused.  Objective   Blood pressure (!) 164/87, pulse 100, temperature 97.7 F (36.5 C), temperature source Oral, resp. rate (!) 23, weight 115.4 kg, SpO2 100 %.        Intake/Output Summary (Last 24 hours) at 08/01/2021 0848 Last data filed at 08/01/2021 0745 Gross per  24 hour  Intake 3868.17 ml  Output 1500 ml  Net 2368.17 ml    Filed Weights   07/31/21 0500 08/01/21 0422  Weight: 114.9 kg 115.4 kg    Examination: General: in bed, confused HENT: dry MM Lungs: CTAB, NWOB on RA Cardiovascular: RRR Abdomen: obese Extremities: No cyanosis. No clubbing. No edema Neuro: alert, confused  Resolved Hospital Problem list   x  Assessment & Plan:   Nausea and Vomiting: Suspect related to acidosis. CT AP ok. Seems improved. --PRN anti-emetics  Severe anion gap metabolic acidosis due to starvation ketosis: in setting of poor PO intake. Resolved. --Normal lactic acid, normal salicylate, normal ethanol level and normal Tylenol level --thiamine, folate --stop D5, trial PO, may need  AKI - present on admission. Due to hypovolemia. Cr improved with  fluids.  Depression, Bipolar and anxiety, chronic pain:  --resume home medications including Trintellix and Vraylar --For now hold armodafinil, doxepin, Horizant, Nucynta -- EKG 07/30/2021: Normal QRS and EKG at admission  Refeeding: Phos undetectable, K low --continue K, phos supplementation  Toxic Metabolic encephalopathy: Due to ketosis, off home meds --attempt PO feed --incrementally resume home meds  Sacral injury: present on arrival, excoriated presume from diarrhe at home  Diarrhea: suspect malabsorption. Present on arrival.  --C diff to rule out community acquired C diff   Best Practice (right click and "Reselect all SmartList Selections" daily)   Diet/type: NPO try diet DVT prophylaxis: prophylactic heparin  GI prophylaxis: PPI Lines: N/A Foley:  N/A Code Status:  full code Last date of multidisciplinary goals of care discussion [n/a]     Critical Care   CRITICAL CARE N/a   I spent 38 minutes in care of the patient on day of service including review of medical record, face-to-face evaluation, coordination of care. 08/01/2021 8:48 AM     LABS    PULMONARY Recent Labs  Lab 07/30/21 0420 07/30/21 1116 07/30/21 1659  PHART  --  7.194* 7.245*  PCO2ART  --  24.4* 21.5*  PO2ART  --  44.7* 123*  HCO3 5.5* 9.0* 9.0*  O2SAT 71.5 75.1 98.2     CBC Recent Labs  Lab 07/30/21 1717 07/31/21 0312 08/01/21 0246  HGB 12.5 12.3 12.0  HCT 40.5 38.2 34.8*  WBC 18.7* 13.4* 7.3  PLT 253 214 204     COAGULATION Recent Labs  Lab 07/30/21 0226 07/30/21 0843 07/30/21 1705  INR 1.3* 1.3* 1.3*     CARDIAC  No results for input(s): TROPONINI in the last 168 hours. No results for input(s): PROBNP in the last 168 hours.   CHEMISTRY Recent Labs  Lab 07/30/21 0843 07/30/21 1717 07/31/21 0312 07/31/21 1915 08/01/21 0246  NA 143 147* 145 144 143  K 3.6 3.4* 3.1* 2.9* 3.2*  CL 105 114* 114* 105 102  CO2 9* 11* 15* 21* 24  GLUCOSE 112* 100* 95  102* 104*  BUN 20 14 9 6  <5*  CREATININE 1.10* 0.98 1.01* 0.85 0.67  CALCIUM 9.8 9.5 9.4 9.5 8.9  MG 2.2  --  2.0  --  1.6*  PHOS 2.9  --  <1.0*  --  1.9*    Estimated Creatinine Clearance: 99.1 mL/min (by C-G formula based on SCr of 0.67 mg/dL).   LIVER Recent Labs  Lab 07/30/21 0226 07/30/21 0843 07/30/21 1705  AST 23 19  --   ALT 27 22  --   ALKPHOS 141* 128*  --   BILITOT 1.8* 2.0*  --   PROT 9.1* 8.0  --  ALBUMIN 5.5* 4.9  --   INR 1.3* 1.3* 1.3*      INFECTIOUS Recent Labs  Lab 07/30/21 0226 07/30/21 0842  LATICACIDVEN 1.5 0.8      ENDOCRINE CBG (last 3)  Recent Labs    07/30/21 0442 07/30/21 2111 07/31/21 1438  GLUCAP 95 107* 95          IMAGING x48h  - image(s) personally visualized  -   highlighted in bold CT ABDOMEN PELVIS WO CONTRAST  Result Date: 07/30/2021 CLINICAL DATA:  Abdominal pain and elevated lipase, unremarkable CT earlier in the same day. EXAM: CT ABDOMEN AND PELVIS WITHOUT CONTRAST TECHNIQUE: Multidetector CT imaging of the abdomen and pelvis was performed following the standard protocol without IV contrast. Examination is significantly limited by patient motion artifact. COMPARISON:  07/30/2021 FINDINGS: Lower chest: No acute abnormality is noted in the bases bilaterally. Hepatobiliary: No focal liver abnormality is seen. Status post cholecystectomy. No biliary dilatation. Pancreas: Pancreas shows no mass lesion or ductal obstruction. Some very minimal peripancreatic inflammatory changes are seen which may represent some mild acute pancreatitis. These changes have progressed slightly in the interval from the prior examination this morning. Spleen: Normal in size without focal abnormality. Adrenals/Urinary Tract: Adrenal glands are within normal limits. Kidneys show no renal calculi. Some residual contrast is seen within the right collecting system as well as the bladder. 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 Stomach/Bowel: Colon shows no obstructive or inflammatory changes. The appendix is not well seen although no inflammatory changes are seen. Small bowel and stomach are within normal limits. Vascular/Lymphatic: Aortic atherosclerosis. No enlarged abdominal or pelvic lymph nodes. Reproductive: Uterus and bilateral adnexa are unremarkable. Other: No abdominal wall hernia or abnormality. No abdominopelvic ascites. Musculoskeletal: No acute or significant osseous findings. IMPRESSION: Very minimal peripancreatic inflammatory change near the head of the pancreas. No significant phlegmon is noted. These changes are likely related to very mild acute appendicitis. Chronic changes similar to that seen on the prior exam. Examination is somewhat limited due to motion artifact. Electronically Signed   By: Alcide Clever M.D.   On: 07/30/2021 17:06

## 2021-08-01 NOTE — TOC Initial Note (Signed)
Transition of Care [facility]) - Initial/Assessment Note    Patient Details  Name: Holly Roberts MRN: [id number] Date of Birth: 06-10-1966  Transition of Care [facility]) CM/SW Contact:    Golda Acre, RN Phone Number: 08/01/2021, 8:20 AM  Clinical Narrative:                 55 year old obese female with significant bipolar disease, anxiety, depression and on multiple CNS medications listed below, along with chronic pain syndrome for which she is on gabapentin.  Originally admitted 04/20/2021 for acute cholecystitis.  Status post lap cholecystectomy 04/21/2021.  Postop course complicated by postcholecystectomy diarrhea.  She started on cholestyramine but it is no longer listed in the medical history.  She was then readmitted 06/09/2021 with 4-day history of vomiting and diarrhea and very poor oral intake, this was associated with a lipase of 263 and acute kidney injury with abnormal UA WBC greater than 50 creatinine 2.65 BUN 45, high anion gap metabolic acidosis greater than 20 with an albumin of 4.7, hypoglycemia, mild hyperbilirubinemia 2.2.  Her illness resolved with fluid therapy and her lab normalities resolved at the time of discharge 06/10/2021.  Then on 07/24/2021 she present to the ER with emesis and abdominal pain, diarrhea.  Anion gap again elevated to 16 [no acute kidney injury].  Symptoms resolved with fluids and she was discharged  She represented again on 07/29/2021 to the emergency department with significant nausea, decreased p.o. intake, reports of noncompliance with diet [?  Noncompliance with medications] was found to be in agitated encephalopathy [new compared to June 2022] with significant SIRS physiology [tachycardic tachypneic but afebrile and normal pulse ox] has severe anion gap metabolic acidosis with a gap of 28 and a high albumin of 5.5 [similar to June 2022] slightly elevated ammonia level but negative salicylate, negative lactic acid negative Tylenol level and normal kidney function [in  June 2022 she had acute kidney injury for similar presentation].  QTC and QRS normal. Serum OSM pending. Per daughter: patient has hx of poor water intake and overall food intake is lower after lap chole. Known to not eat/drink repeatedly for days on end esp currently 1 week prior to admit. Definitely was STARTVING prior week including no meds. Was vomiting a lot for 1 week prior to admit Daughter denied antifreeze     EDP started fomepizole after discussing with poison control.  Patient received 3 A of bicarb.  Given 1 dose of empiric antibiotic.  And 1 L of fluid Significant Hospital Events: Including procedures, antibiotic start and stop dates in addition to other pertinent events   07/29/2021 - er 8/14 - seen by CCM at [facility] ER bed 03 8/15 refeeding on D5, attends briefly, mumbles responses, very inattentive, some labs improving  TOC PLAN OF CARE: following for toc needs and progression Expected Discharge Plan: Home/Self Care Barriers to Discharge: Continued Medical Work up   Patient Goals and CMS Choice        Expected Discharge Plan and Services Expected Discharge Plan: Home/Self Care   Discharge Planning Services: CM Consult   Living arrangements for the past 2 months: Apartment                                      Prior Living Arrangements/Services Living arrangements for the past 2 months: Apartment Lives with:: Self Patient language and need for interpreter reviewed:: Yes  Criminal Activity/Legal Involvement Pertinent to Current Situation/Hospitalization: No - Comment as needed  Activities of Daily Living Home Assistive Devices/Equipment: Eyeglasses, Dan Humphreys (specify type) (4 wheeled walker) ADL Screening (condition at time of admission) Patient's cognitive ability adequate to safely complete daily activities?: Yes Is the patient deaf or have difficulty hearing?: No Does the patient have difficulty seeing, even when wearing glasses/contacts?:  No Does the patient have difficulty concentrating, remembering, or making decisions?: No Patient able to express need for assistance with ADLs?: Yes Does the patient have difficulty dressing or bathing?: No Independently performs ADLs?: Yes (appropriate for developmental age) Does the patient have difficulty walking or climbing stairs?: Yes Weakness of Legs: Both Weakness of Arms/Hands: None  Permission Sought/Granted                  Emotional Assessment Appearance:: Appears stated age     Orientation: : Fluctuating Orientation (Suspected and/or reported Sundowners) Alcohol / Substance Use: Not Applicable Psych Involvement: No (comment)  Admission diagnosis:  Metabolic acidosis [E87.2] Acute encephalopathy [G93.40] Patient Active Problem List   Diagnosis Date Noted   Pressure injury of skin 07/31/2021   Acute encephalopathy 07/30/2021   AKI (acute kidney injury) (HCC) 06/09/2021   Hypoglycemia without diagnosis of diabetes mellitus 06/09/2021   Elevated lipase 06/09/2021   Bipolar disorder (HCC) 06/09/2021   Depression 06/09/2021   Anxiety 06/09/2021   Chronic pain 06/09/2021   High anion gap metabolic acidosis 06/09/2021   Vomiting and diarrhea 06/09/2021   Cholecystitis with cholelithiasis 04/19/2021   PCP:  Pcp, No Pharmacy:   [id number] - [place], [place] - [address] AT [facility] [address] [place]  [address] Phone: [phone] Fax: [phone]     Social Determinants of Health (SDOH) Interventions    Readmission Risk Interventions No flowsheet data found.

## 2021-08-01 NOTE — Progress Notes (Signed)
[facility] ADULT ICU REPLACEMENT PROTOCOL   The patient does apply for the [facility] Adult ICU Electrolyte Replacment Protocol based on the criteria listed below:   1.Exclusion criteria: TCTS patients, ECMO patients and Hypothermia Protocol, and   Dialysis patients 2. Is GFR >/= 30 ml/min? Yes.    Patient's GFR today is >60 3. Is SCr </= 2? No. Patient's SCr is 0.67 mg/dL 4. Did SCr increase >/= 0.5 in 24 hours? No. 5.Pt's weight >40kg  Yes.   6. Abnormal electrolyte(s): K 3.2, Mg 1.6, Phos 1.9  7. Electrolytes replaced per protocol   Ardelle Park 08/01/2021 6:40 AM

## 2021-08-01 NOTE — Progress Notes (Signed)
Chaplain responded to request from RN stating patient wanted someone to talk with and agreed for Chaplain to come.  Patient was in the bed with wrist bands as she had been pulling at the wires and attempting to get out of bed.  Patient seemed frustrated.  Patient said faith is not important to her.  She does have a daughter Coralie Common, whom RN says she lives with and is coming to visit tonight.  Chaplain attempted to engage in some breathing exercises and possible ways to find some center and relax.  Patient would try for a moment then revert back to moving around.  Chaplain connected with both RN's providing care and they are trying hard to help her relax and possibly get some sleep.  Chaplain provided support for the care the RN's are providing.  Will have day chaplain for Wednesday follow up.  Chaplain available as needed. Chaplain Agustin Cree, Mdiv.    08/01/21 1841  Clinical Encounter Type  Visited With Patient;Health care provider  Visit Type Psychological support;Social support;Initial;Behavioral Health  Referral From Nurse  Consult/Referral To Chaplain

## 2021-08-01 NOTE — Progress Notes (Signed)
eLink Physician-Brief Progress Note Patient Name: Holly Roberts DOB: 1966-10-29 MRN: [id number]   Date of Service  08/01/2021  HPI/Events of Note  Received request for renewal of restraints Patient remains critically ill and appears confused and is at risk for self harm by pulling lines and tubes  eICU Interventions  Bilateral soft wrist restraints renewed     Intervention Category Minor Interventions: Agitation / anxiety - evaluation and management  Darl Pikes 08/01/2021, 12:50 AM

## 2021-08-01 NOTE — Progress Notes (Signed)
eLink Physician-Brief Progress Note Patient Name: Holly Roberts DOB: 1966-10-26 MRN: [id number]   Date of Service  08/01/2021  HPI/Events of Note  Patient is C-Diff negative . Patient needs something to help her rest.  eICU Interventions  Enteric precautions discontinued and PRN Imodium ordered, Melatonin ordered.        Thomasene Lot Dimitrius Steedman 08/01/2021, 11:49 PM

## 2021-08-02 DIAGNOSIS — L89152 Pressure ulcer of sacral region, stage 2: Secondary | ICD-10-CM

## 2021-08-02 LAB — BASIC METABOLIC PANEL
Anion gap: 17 — ABNORMAL HIGH (ref 5–15)
BUN: <5 mg/dL — ABNORMAL LOW (ref 6–20)
CO2: 25 mmol/L (ref 22–32)
Calcium: 9.2 mg/dL (ref 8.9–10.3)
Chloride: 99 mmol/L (ref 98–111)
Creatinine, Ser: 0.64 mg/dL (ref 0.44–1.00)
GFR, Estimated: >60 mL/min (ref >60)
Glucose, Bld: 96 mg/dL (ref 70–99)
Potassium: 3.1 mmol/L — ABNORMAL LOW (ref 3.5–5.1)
Sodium: 141 mmol/L (ref 135–145)

## 2021-08-02 LAB — MAGNESIUM: Magnesium: 2.3 mg/dL (ref 1.7–2.4)

## 2021-08-02 LAB — PHOSPHORUS: Phosphorus: 2.7 mg/dL (ref 2.5–4.6)

## 2021-08-02 MED ORDER — CHOLESTYRAMINE LIGHT 4 G PO PACK
4.0000 g | PACK | ORAL | Status: DC
  Administered 2021-08-03 – 2021-08-10 (×14): 4 g via ORAL
  Filled 2021-08-02 (×18): qty 1

## 2021-08-02 MED ORDER — PANTOPRAZOLE SODIUM 40 MG PO TBEC
40.0000 mg | DELAYED_RELEASE_TABLET | Freq: Every day | ORAL | Status: DC
  Administered 2021-08-02 – 2021-08-10 (×9): 40 mg via ORAL
  Filled 2021-08-02 (×8): qty 1

## 2021-08-02 MED ORDER — FOLIC ACID 1 MG PO TABS
1.0000 mg | ORAL_TABLET | Freq: Every day | ORAL | Status: DC
  Administered 2021-08-02 – 2021-08-10 (×9): 1 mg via ORAL
  Filled 2021-08-02 (×8): qty 1

## 2021-08-02 MED ORDER — POTASSIUM CHLORIDE CRYS ER 20 MEQ PO TBCR
20.0000 meq | EXTENDED_RELEASE_TABLET | ORAL | Status: AC
  Administered 2021-08-02 (×2): 20 meq via ORAL
  Filled 2021-08-02 (×2): qty 1

## 2021-08-02 MED ORDER — THIAMINE HCL 100 MG PO TABS
100.0000 mg | ORAL_TABLET | Freq: Every day | ORAL | Status: DC
  Administered 2021-08-02 – 2021-08-10 (×9): 100 mg via ORAL
  Filled 2021-08-02 (×8): qty 1

## 2021-08-02 MED ORDER — TAPENTADOL HCL ER 100 MG PO TB12
200.0000 mg | ORAL_TABLET | Freq: Two times a day (BID) | ORAL | Status: DC
Start: 2021-08-02 — End: 2021-08-04
  Administered 2021-08-02 – 2021-08-04 (×5): 200 mg via ORAL
  Filled 2021-08-02 (×5): qty 2

## 2021-08-02 MED ORDER — STERILE WATER FOR INJECTION IJ SOLN
INTRAMUSCULAR | Status: AC
  Filled 2021-08-02: qty 10

## 2021-08-02 MED ORDER — OLANZAPINE 10 MG IM SOLR
10.0000 mg | Freq: Once | INTRAMUSCULAR | Status: AC | PRN
  Administered 2021-08-02: 10 mg via INTRAMUSCULAR
  Filled 2021-08-02: qty 10

## 2021-08-02 MED ORDER — GABAPENTIN 300 MG PO CAPS
300.0000 mg | ORAL_CAPSULE | Freq: Three times a day (TID) | ORAL | Status: DC
  Administered 2021-08-02 – 2021-08-10 (×25): 300 mg via ORAL
  Filled 2021-08-02 (×25): qty 1

## 2021-08-02 MED ORDER — TRAZODONE HCL 50 MG PO TABS
50.0000 mg | ORAL_TABLET | Freq: Every evening | ORAL | Status: DC | PRN
  Administered 2021-08-03 – 2021-08-09 (×6): 50 mg via ORAL
  Filled 2021-08-02 (×6): qty 1

## 2021-08-02 MED ORDER — POTASSIUM CHLORIDE 10 MEQ/100ML IV SOLN
10.0000 meq | INTRAVENOUS | Status: AC
  Administered 2021-08-02 (×3): 10 meq via INTRAVENOUS
  Filled 2021-08-02 (×2): qty 100

## 2021-08-02 MED ORDER — DOXEPIN HCL 50 MG PO CAPS
50.0000 mg | ORAL_CAPSULE | Freq: Every day | ORAL | Status: DC
  Administered 2021-08-02 – 2021-08-09 (×8): 50 mg via ORAL
  Filled 2021-08-02 (×11): qty 1

## 2021-08-02 MED ORDER — OXYCODONE HCL 5 MG PO TABS
5.0000 mg | ORAL_TABLET | ORAL | Status: DC | PRN
  Administered 2021-08-02: 5 mg via ORAL
  Filled 2021-08-02: qty 1

## 2021-08-02 NOTE — Progress Notes (Signed)
Pt received on the unit from ICU. Pt oriented to call bell, unit, and phone. Pt requesting something to help her sleep, MD notified.

## 2021-08-02 NOTE — Progress Notes (Signed)
eLink Physician-Brief Progress Note Patient Name: Holly Roberts DOB: [birth year] MRN: [id number]   Date of Service  08/02/2021  HPI/Events of Note  Patient with agitated delirium.  eICU Interventions  Zyprexa 10 mg IM x 1 ordered.        [name] 08/02/2021, 6:12 AM

## 2021-08-02 NOTE — Progress Notes (Signed)
PROGRESS NOTE    Holly Roberts  WUJ:[id number] DOB: 1966/04/02 DOA: 07/29/2021 PCP: Pcp, No    Brief Narrative:  Holly Roberts is a 55 year old female with past medical history significant for bipolar disorder, anxiety/depression, chronic pain syndrome, morbid obesity who presented to [facility] ED on 8/13 with nausea, decreased oral intake, confusion.  Daughter reports poor oral intake to include liquids/solids following laparoscopic cholecystectomy.  Originally admitted on 04/20/2021 for cold cholecystitis s/p laparoscopic cholecystectomy on 5/6 with postoperative course complicated by postcholecystectomy diarrhea in which she was started on cholestyramine.  Patient was readmitted on 6/24 2022 with 4-day history of vomiting/diarrhea and poor oral intake associated with a lipase of 263, AKI and UTI with high anion gap metabolic acidosis, hypoglycemia, hyperbilirubinemia; which illness resolved with IV fluid therapy and subsequent discharge on 06/10/2021.  Patient then represented to the ED on 07/24/2021 with emesis, abdominal pain, diarrhea with anion gap elevated to 16 and symptoms resolved with IV fluids and was subsequently discharged.  In the ED, patient was noted to be tachycardic, tachycardia hypnic, afebrile with normal SPO2.  Severe anion gap metabolic acidosis of 28 with albumin 5.5, slightly elevated ammonia level but negative salicylate.  Normal lactic acid, normal Tylenol level and normal kidney function.  EDP started omeprazole after discussing with poison control and she received 3 A of sodium bicarbonate, given 1 dose of empiric antibiotics and 1 L of IV fluid.  Patient was initially admitted to the [facility] service for severe anion gap metabolic acidosis with associated nausea and vomiting likely secondary to starvation ketosis.  Patient transferred to [facility] on 07/1719 22.   Assessment & Plan:   Active Problems:   Metabolic acidosis   Acute encephalopathy   Pressure injury of skin   Acute toxic  metabolic encephalopathy; POA: Improving Etiology likely secondary to her underlying starvation ketosis, noncompliance with her home medications.  CT head without contrast on admission with chronic right PCA infarct, otherwise no acute intracranial abnormality.  CTA chest negative for PE, no pneumonia or pulmonary infarction and otherwise unrevealing.  CT abdomen/pelvis with mild acute pancreatitis, otherwise unrevealing.  Continue treatment as below.  Severe anion gap metabolic acidosis 2/2 starvation ketosis Nausea/vomiting Patient presenting with confusion, found to have elevated anion gap with associated nausea and vomiting in the setting of poor oral intake.  Onset of symptoms related to recent cholecystectomy on 04/21/2021.  Multiple ED presentations/hospitalizations for similar events following surgical procedure.  Also question if she is compliant with her home medications. --Anion Gap 28>>17 --IV fluids discontinued, started on diet --Continue to monitor BMP, magnesium, phosphorus daily  Acute renal failure: Resolved Creatinine elevated 1.26 on arrival.  Likely secondary to prerenal azotemia from dehydration.  Started on IV fluids on arrival with resolution. --Cr 1.26>>0.64  Refeeding syndrome Patient with poor oral intake since her cholecystectomy on 04/21/2021.  Multiple hospitalizations with nausea/vomiting with family reports of poor oral intake.  Patient was noted to have low potassium, magnesium and phosphorus which have been supplemented. --Continue to monitor BMP, potassium, magnesium, phosphorus daily --Encourage increase oral intake --Nutrition consult  Bipolar disorder Anxiety/depression --Vraylar 6mg  PO daily --Trintellix 20mg  PO daily --Resume home doxepin at reduced dose 50 mg p.o. nightly  Diarrhea C. difficile negative.  Etiology likely secondary to malabsorption. --Start cholestyramine BID --Imodium as needed  GERD: Protonix 40 mg p.o. daily  Essential  hypertension BP 153/96 this morning --Continue amlodipine 10 mg p.o. daily --Labetalol/hydralazine as needed --Continue monitor BP closely  Sacral ulcer, stage  II; POA Pressure Injury 07/30/21 Sacrum Mid Stage 2 -  Partial thickness loss of dermis presenting as a shallow open injury with a red, pink wound bed without slough. redness (nonblanchable), open area of skin (Active)  07/30/21 1110  Location: Sacrum  Location Orientation: Mid  Staging: Stage 2 -  Partial thickness loss of dermis presenting as a shallow open injury with a red, pink wound bed without slough.  Wound Description (Comments): redness (nonblanchable), open area of skin  Present on Admission: Yes  --Continue local wound care, offloading   DVT prophylaxis: heparin injection 5,000 Units Start: 07/30/21 1400   Code Status: Full Code Family Communication: Updated patient's daughter Holly Roberts via telephone this afternoon  Disposition Plan:  Level of care: Telemetry Status is: Inpatient  Remains inpatient appropriate because:Persistent severe electrolyte disturbances, Altered mental status, Unsafe d/c plan, IV treatments appropriate due to intensity of illness or inability to take PO, and Inpatient level of care appropriate due to severity of illness  Dispo: The patient is from: Home              Anticipated d/c is to:  To be determined              Patient currently is not medically stable to d/c.   Difficult to place patient No  Consultants:  PCCM - signed off 8/17  Procedures:  None  Antimicrobials:  Ceftriaxone 8/14 - 8/14 Metronidazole 8/14 - 8/14   Subjective: Patient seen examined at bedside, resting comfortably.  Pleasantly confused.  No family present at bedside.  Received IM Zyprexa by critical care service earlier this morning for agitation.  States wishes to go home.  Patient knows she is at [place], does not know the year and thinks President Holly Roberts is a current president of [place].   Continues with poor oral intake; assistance with feeding per nursing staff.  No other questions or concerns at this time.  Unable to obtain any further ROS due to her mental status.  No acute concerns this morning per nursing staff.  Objective: Vitals:   08/02/21 0500 08/02/21 0535 08/02/21 0752 08/02/21 1152  BP:      Pulse: (!) 109 86    Resp: 18 20    Temp:   98.2 F (36.8 C) 98.8 F (37.1 C)  TempSrc:   Oral Oral  SpO2: 100% 100%    Weight:      Height:        Intake/Output Summary (Last 24 hours) at 08/02/2021 1204 Last data filed at 08/01/2021 2200 Gross per 24 hour  Intake 852.21 ml  Output 700 ml  Net 152.21 ml   Filed Weights   07/31/21 0500 08/01/21 0422  Weight: 114.9 kg 115.4 kg    Examination:  General exam: Appears calm and comfortable, pleasantly confused, in wrist restraints Respiratory system: Clear to auscultation. Respiratory effort normal.  On room air Cardiovascular system: S1 & S2 heard, RRR. No JVD, murmurs, rubs, gallops or clicks. No pedal edema. Gastrointestinal system: Abdomen is nondistended, soft and nontender. No organomegaly or masses felt. Normal bowel sounds heard. Central nervous system: Alert; oriented to place Holly Roberts), not time (does not know year), or person Building surveyor), nor situation. No focal neurological deficits. Extremities: Moves all extremities independently Skin: Sacral ulcer noted, otherwise no rashes, lesions or ulcers Psychiatry: Judgement and insight appear poor. Mood & affect appropriate.     Data Reviewed: I have personally reviewed following labs and imaging studies  CBC: Recent  Labs  Lab 07/30/21 0226 07/30/21 0843 07/30/21 1705 07/30/21 1717 07/31/21 0312 08/01/21 0246  WBC 20.1* 21.0*  --  18.7* 13.4* 7.3  NEUTROABS 18.0*  --   --  16.8*  --   --   HGB 14.8 13.2  --  12.5 12.3 12.0  HCT 46.1* 41.9  --  40.5 38.2 34.8*  MCV 94.9 94.6  --  94.4 93.6 86.1  PLT 374 302 267 253 214 204   Basic  Metabolic Panel: Recent Labs  Lab 07/30/21 0843 07/30/21 1717 07/31/21 0312 07/31/21 1915 08/01/21 0246 08/02/21 0315  NA 143 147* 145 144 143 141  K 3.6 3.4* 3.1* 2.9* 3.2* 3.1*  CL 105 114* 114* 105 102 99  CO2 9* 11* 15* 21* 24 25  GLUCOSE 112* 100* 95 102* 104* 96  BUN 20 14 9 6  <5* <5*  CREATININE 1.10* 0.98 1.01* 0.85 0.67 0.64  CALCIUM 9.8 9.5 9.4 9.5 8.9 9.2  MG 2.2  --  2.0  --  1.6* 2.3  PHOS 2.9  --  <1.0*  --  1.9* 2.7   GFR: Estimated Creatinine Clearance: 99.1 mL/min (by C-G formula based on SCr of 0.64 mg/dL). Liver Function Tests: Recent Labs  Lab 07/30/21 0226 07/30/21 0843  AST 23 19  ALT 27 22  ALKPHOS 141* 128*  BILITOT 1.8* 2.0*  PROT 9.1* 8.0  ALBUMIN 5.5* 4.9   Recent Labs  Lab 07/30/21 0843  LIPASE 322*  AMYLASE 455*   Recent Labs  Lab 07/30/21 0240 07/30/21 0842  AMMONIA 109* 70*   Coagulation Profile: Recent Labs  Lab 07/30/21 0226 07/30/21 0843 07/30/21 1705  INR 1.3* 1.3* 1.3*   Cardiac Enzymes: Recent Labs  Lab 07/30/21 0843  CKTOTAL 141  CKMB 11.3*   BNP (last 3 results) No results for input(s): PROBNP in the last 8760 hours. HbA1C: No results for input(s): HGBA1C in the last 72 hours. CBG: Recent Labs  Lab 07/30/21 0442 07/30/21 2111 07/31/21 1438  GLUCAP 95 107* 95   Lipid Profile: Recent Labs    07/31/21 0312  TRIG 75   Thyroid Function Tests: No results for input(s): TSH, T4TOTAL, FREET4, T3FREE, THYROIDAB in the last 72 hours. Anemia Panel: No results for input(s): VITAMINB12, FOLATE, FERRITIN, TIBC, IRON, RETICCTPCT in the last 72 hours. Sepsis Labs: Recent Labs  Lab 07/30/21 0226 07/30/21 0842  LATICACIDVEN 1.5 0.8    Recent Results (from the past 240 hour(s))  Blood culture (routine single)     Status: None (Preliminary result)   Collection Time: 07/30/21  2:40 AM   Specimen: BLOOD  Result Value Ref Range Status   Specimen Description   Final    BLOOD RIGHT ANTECUBITAL Performed at  [facility], [address], [place], [place] [address]    Special Requests   Final    BOTTLES DRAWN AEROBIC AND ANAEROBIC Blood Culture results may not be optimal due to an inadequate volume of blood received in culture bottles Performed at [facility], [address] [place], [place] [address]    Culture   Final    NO GROWTH 3 DAYS Performed at [facility], [address], [place], [place] [address]    Report Status PENDING  Incomplete  SARS CORONAVIRUS 2 (TAT 6-24 HRS) Nasopharyngeal Nasopharyngeal Swab     Status: None   Collection Time: 07/30/21 10:22 AM   Specimen: Nasopharyngeal Swab  Result Value Ref Range Status   SARS Coronavirus 2  NEGATIVE NEGATIVE Final    Comment: (NOTE) SARS-CoV-2 target nucleic acids are NOT DETECTED.  The SARS-CoV-2 RNA is generally detectable in upper and lower respiratory specimens during the acute phase of infection. Negative results do not preclude SARS-CoV-2 infection, do not rule out co-infections with other pathogens, and should not be used as the sole basis for treatment or other patient management decisions. Negative results must be combined with clinical observations, patient history, and epidemiological information. The expected result is Negative.  Fact Sheet for Patients: HairSlick.no  Fact Sheet for Healthcare Providers: quierodirigir.com  This test is not yet approved or cleared by the [place] FDA and  has been authorized for detection and/or diagnosis of SARS-CoV-2 by FDA under an Emergency Use Authorization (EUA). This EUA will remain  in effect (meaning this test can be used) for the duration of the COVID-19 declaration under Se ction 564(b)(1) of the Act, 21 U.S.C. section 360bbb-3(b)(1), unless the authorization is terminated or revoked sooner.  Performed at [facility], [address], [place],  [place] [address]   MRSA Next Gen by PCR, Nasal     Status: None   Collection Time: 07/30/21 11:19 AM   Specimen: Nasal Mucosa; Nasal Swab  Result Value Ref Range Status   MRSA by PCR Next Gen NOT DETECTED NOT DETECTED Final    Comment: (NOTE) The GeneXpert MRSA Assay (FDA approved for NASAL specimens only), is one component of a comprehensive MRSA colonization surveillance program. It is not intended to diagnose MRSA infection nor to guide or monitor treatment for MRSA infections. Test performance is not FDA approved in patients less than 10 years old. Performed at [facility], [address] [place], [place] [address]   Urine Culture     Status: None   Collection Time: 07/30/21  2:50 PM   Specimen: In/Out Cath Urine  Result Value Ref Range Status   Specimen Description   Final    IN/OUT CATH URINE Performed at [facility], [address], [place], [place] [address]    Special Requests   Final    NONE Performed at [facility], [address], [place], [place] [address]    Culture   Final    NO GROWTH Performed at [facility], [address], [place], [place] [address]    Report Status 07/31/2021 FINAL  Final  C Difficile Quick Screen (NO PCR Reflex)     Status: None   Collection Time: 08/01/21  4:20 PM   Specimen: STOOL  Result Value Ref Range Status   C Diff antigen NEGATIVE NEGATIVE Final   C Diff toxin NEGATIVE NEGATIVE Final   C Diff interpretation No C. difficile detected.  Final    Comment: Performed at [facility], [address] [place], [place] [address]         Radiology Studies: No results found.      Scheduled Meds:  sterile water (preservative free)       amLODipine  10 mg Oral Daily   cariprazine  6 mg Oral Daily   chlorhexidine  15 mL Mouth Rinse BID   Chlorhexidine Gluconate Cloth  6 each Topical Daily   folic acid  1 mg Intravenous Q12H   heparin  5,000 Units  Subcutaneous Q8H   mouth rinse  15 mL Mouth Rinse q12n4p   pantoprazole (PROTONIX) IV  40 mg Intravenous QHS   thiamine injection  100 mg Intravenous Q12H  vortioxetine HBr  20 mg Oral Daily   Continuous Infusions:  sodium chloride 10 mL/hr at 08/01/21 0600     LOS: 3 days    Time spent: 39 minutes spent on chart review, discussion with nursing staff, consultants, updating family and interview/physical exam; more than 50% of that time was spent in counseling and/or coordination of care.    Alvira Philips [place], DO [facility] Available via Epic secure chat 7am-7pm After these hours, please refer to coverage provider listed on [email or web address] 08/02/2021, 12:04 PM

## 2021-08-02 NOTE — Progress Notes (Signed)
[facility] ADULT ICU REPLACEMENT PROTOCOL   The patient does apply for the [facility] Adult ICU Electrolyte Replacment Protocol based on the criteria listed below:   1.Exclusion criteria: TCTS patients, ECMO patients and Hypothermia Protocol, and   Dialysis patients 2. Is GFR >/= 30 ml/min? Yes.    Patient's GFR today is >60 3. Is SCr </= 2? Yes.   Patient's SCr is 0.64 mg/dL 4. Did SCr increase >/= 0.5 in 24 hours? Yes.   5.Pt's weight >40kg  Yes.   6. Abnormal electrolyte(s): K 3.1  7. Electrolytes replaced per protocol   Ardelle Park 08/02/2021 5:22 AM

## 2021-08-03 LAB — BLOOD CULTURE ID PANEL (REFLEXED) - BCID2

## 2021-08-03 LAB — BASIC METABOLIC PANEL
Anion gap: 14 (ref 5–15)
BUN: 7 mg/dL (ref 6–20)
CO2: 30 mmol/L (ref 22–32)
Calcium: 9.8 mg/dL (ref 8.9–10.3)
Chloride: 101 mmol/L (ref 98–111)
Creatinine, Ser: 0.71 mg/dL (ref 0.44–1.00)
GFR, Estimated: >60 mL/min (ref >60)
Glucose, Bld: 89 mg/dL (ref 70–99)
Potassium: 3.2 mmol/L — ABNORMAL LOW (ref 3.5–5.1)
Sodium: 145 mmol/L (ref 135–145)

## 2021-08-03 LAB — PHOSPHORUS: Phosphorus: 3.8 mg/dL (ref 2.5–4.6)

## 2021-08-03 LAB — MAGNESIUM: Magnesium: 2.4 mg/dL (ref 1.7–2.4)

## 2021-08-03 MED ORDER — LORAZEPAM 0.5 MG PO TABS
0.5000 mg | ORAL_TABLET | Freq: Four times a day (QID) | ORAL | Status: DC | PRN
  Administered 2021-08-03 – 2021-08-06 (×6): 0.5 mg via ORAL
  Filled 2021-08-03 (×6): qty 1

## 2021-08-03 MED ORDER — ENSURE ENLIVE PO LIQD
237.0000 mL | Freq: Two times a day (BID) | ORAL | Status: DC
  Administered 2021-08-03 – 2021-08-10 (×15): 237 mL via ORAL

## 2021-08-03 MED ORDER — SODIUM CHLORIDE 0.9 % IV BOLUS
1000.0000 mL | Freq: Once | INTRAVENOUS | Status: AC
  Administered 2021-08-03: 1000 mL via INTRAVENOUS

## 2021-08-03 MED ORDER — POTASSIUM CHLORIDE CRYS ER 10 MEQ PO TBCR
40.0000 meq | EXTENDED_RELEASE_TABLET | ORAL | Status: AC
  Administered 2021-08-03 (×2): 40 meq via ORAL
  Filled 2021-08-03 (×2): qty 4

## 2021-08-03 MED ORDER — ADULT MULTIVITAMIN W/MINERALS CH
1.0000 | ORAL_TABLET | Freq: Every day | ORAL | Status: DC
  Administered 2021-08-03 – 2021-08-10 (×8): 1 via ORAL
  Filled 2021-08-03 (×8): qty 1

## 2021-08-03 MED ORDER — ONDANSETRON HCL 4 MG/2ML IJ SOLN
4.0000 mg | Freq: Four times a day (QID) | INTRAMUSCULAR | Status: DC | PRN
  Administered 2021-08-03 – 2021-08-10 (×5): 4 mg via INTRAVENOUS
  Filled 2021-08-03 (×6): qty 2

## 2021-08-03 MED ORDER — METOPROLOL TARTRATE 25 MG PO TABS
25.0000 mg | ORAL_TABLET | Freq: Two times a day (BID) | ORAL | Status: DC
  Administered 2021-08-03 – 2021-08-04 (×3): 25 mg via ORAL
  Filled 2021-08-03 (×3): qty 1

## 2021-08-03 MED ORDER — JUVEN PO PACK
1.0000 | PACK | Freq: Two times a day (BID) | ORAL | Status: DC
  Administered 2021-08-05 – 2021-08-10 (×8): 1 via ORAL
  Filled 2021-08-03 (×15): qty 1

## 2021-08-03 NOTE — Progress Notes (Signed)
Initial Nutrition Assessment  DOCUMENTATION CODES:   Obesity unspecified  INTERVENTION:   -Ensure Plus PO BID, each provides 350 kcals and 13g protein  -Multivitamin with minerals daily  -Juven Fruit Punch BID, each serving provides 95kcal and 2.5g of protein (amino acids glutamine and arginine)  NUTRITION DIAGNOSIS:   Inadequate oral intake related to poor appetite, nausea as evidenced by per patient/family report.  GOAL:   Patient will meet greater than or equal to 90% of their needs  MONITOR:   PO intake, Supplement acceptance, Labs, Weight trends, I & O's  REASON FOR ASSESSMENT:   Consult Assessment of nutrition requirement/status  ASSESSMENT:   55 year old female with past medical history significant for bipolar disorder, anxiety/depression, chronic pain syndrome, morbid obesity who presented to [facility] ED on 8/13 with nausea, decreased oral intake, confusion.  Daughter reports poor oral intake to include liquids/solids following laparoscopic cholecystectomy.  Patient with improved mental status, consumed 75% of breakfast this morning. Ate 20% of dinner last night.  Pt is s/p lap cholecystectomy in May 2022 and since that surgery has had nausea and poor PO (x 3 months). Pt wasn't eating or drinking for 1 week PTA per family report. MD diagnosed pt with refeeding syndrome given lab values. Undergoing repletion, K is still low. Thiamine was ordered as well. Will order Ensure and Juven supplements for wound healing and daily MVI.  Per weight records, pt with some increased weight since surgery in May. Suspect malnutrition but unable to meet diagnostic criteria at this time.  I/Os: +6.8L since admit  Medications: Folic acid, Thiamine  Labs reviewed: CBGs: 95 Low K   NUTRITION - FOCUSED PHYSICAL EXAM:  No depletions noted.  Diet Order:   Diet Order             Diet regular Room service appropriate? Yes; Fluid consistency: Thin  Diet effective now                    EDUCATION NEEDS:   No education needs have been identified at this time  Skin:  Skin Assessment: Skin Integrity Issues: Skin Integrity Issues:: Stage II Stage II: mid sacrum  Last BM:  8/18  Height:   Ht Readings from Last 1 Encounters:  08/01/21 5\' 4"  (1.626 m)    Weight:   Wt Readings from Last 1 Encounters:  08/03/21 118.2 kg    BMI:  Body mass index is 44.73 kg/m.  Estimated Nutritional Needs:   Kcal:  1800-2000  Protein:  85-100g  Fluid:  2l/day  08/05/21, MS, RD, LDN Inpatient Clinical Dietitian Contact information available via Amion

## 2021-08-03 NOTE — Progress Notes (Signed)
PHARMACY - PHYSICIAN COMMUNICATION CRITICAL VALUE ALERT - BLOOD CULTURE IDENTIFICATION (BCID)  [name] is an 55 y.o. female who presented to [facility] on 07/29/2021 with a chief complaint of nausea, decreased oral intake, confusion  Assessment: 8/14 BCx 1 set: aerobic bottle only: GPC in clusters, nothing on BCID panel- suspect contaminant  Name of physician (or Provider) Contacted: Johann Capers via Secure chat  Current antibiotics: none  Changes to prescribed antibiotics recommended:  No treatment indicated  Results for orders placed or performed during the hospital encounter of 07/29/21  Blood Culture ID Panel (Reflexed) (Collected: 07/30/2021  2:40 AM)  Result Value Ref Range   Enterococcus faecalis NOT DETECTED NOT DETECTED   Enterococcus Faecium NOT DETECTED NOT DETECTED   Listeria monocytogenes NOT DETECTED NOT DETECTED   Staphylococcus species NOT DETECTED NOT DETECTED   Staphylococcus aureus (BCID) NOT DETECTED NOT DETECTED   Staphylococcus epidermidis NOT DETECTED NOT DETECTED   Staphylococcus lugdunensis NOT DETECTED NOT DETECTED   Streptococcus species NOT DETECTED NOT DETECTED   Streptococcus agalactiae NOT DETECTED NOT DETECTED   Streptococcus pneumoniae NOT DETECTED NOT DETECTED   Streptococcus pyogenes NOT DETECTED NOT DETECTED   A.calcoaceticus-baumannii NOT DETECTED NOT DETECTED   Bacteroides fragilis NOT DETECTED NOT DETECTED   Enterobacterales NOT DETECTED NOT DETECTED   Enterobacter cloacae complex NOT DETECTED NOT DETECTED   Escherichia coli NOT DETECTED NOT DETECTED   Klebsiella aerogenes NOT DETECTED NOT DETECTED   Klebsiella oxytoca NOT DETECTED NOT DETECTED   Klebsiella pneumoniae NOT DETECTED NOT DETECTED   Proteus species NOT DETECTED NOT DETECTED   Salmonella species NOT DETECTED NOT DETECTED   Serratia marcescens NOT DETECTED NOT DETECTED   Haemophilus influenzae NOT DETECTED NOT DETECTED   Neisseria meningitidis NOT DETECTED NOT DETECTED    Pseudomonas aeruginosa NOT DETECTED NOT DETECTED   Stenotrophomonas maltophilia NOT DETECTED NOT DETECTED   Candida albicans NOT DETECTED NOT DETECTED   Candida auris NOT DETECTED NOT DETECTED   Candida glabrata NOT DETECTED NOT DETECTED   Candida krusei NOT DETECTED NOT DETECTED   Candida parapsilosis NOT DETECTED NOT DETECTED   Candida tropicalis NOT DETECTED NOT DETECTED   Cryptococcus neoformans/gattii NOT DETECTED NOT DETECTED    Len Childs T 08/03/2021  7:07 PM

## 2021-08-03 NOTE — Progress Notes (Signed)
PROGRESS NOTE    Holly Roberts  WUJ:[id number] DOB: [birth year] DOA: 07/29/2021 PCP: Pcp, No    Brief Narrative:  Holly Roberts is a 55 year old female with past medical history significant for bipolar disorder, anxiety/depression, chronic pain syndrome, morbid obesity who presented to [facility] ED on 8/13 with nausea, decreased oral intake, confusion.  Daughter reports poor oral intake to include liquids/solids following laparoscopic cholecystectomy.  Originally admitted on 04/20/2021 for cold cholecystitis s/p laparoscopic cholecystectomy on 5/6 with postoperative course complicated by postcholecystectomy diarrhea in which she was started on cholestyramine.  Patient was readmitted on 6/24 2022 with 4-day history of vomiting/diarrhea and poor oral intake associated with a lipase of 263, AKI and UTI with high anion gap metabolic acidosis, hypoglycemia, hyperbilirubinemia; which illness resolved with IV fluid therapy and subsequent discharge on 06/10/2021.  Patient then represented to the ED on 07/24/2021 with emesis, abdominal pain, diarrhea with anion gap elevated to 16 and symptoms resolved with IV fluids and was subsequently discharged.  In the ED, patient was noted to be tachycardic, tachycardia hypnic, afebrile with normal SPO2.  Severe anion gap metabolic acidosis of 28 with albumin 5.5, slightly elevated ammonia level but negative salicylate.  Normal lactic acid, normal Tylenol level and normal kidney function.  EDP started omeprazole after discussing with poison control and she received 3 A of sodium bicarbonate, given 1 dose of empiric antibiotics and 1 L of IV fluid.  Patient was initially admitted to the [facility] service for severe anion gap metabolic acidosis with associated nausea and vomiting likely secondary to starvation ketosis.  Patient transferred to [facility] on 07/1719 22.   Assessment & Plan:   Active Problems:   Metabolic acidosis   Acute encephalopathy   Pressure injury of skin   Acute toxic  metabolic encephalopathy; POA: Improving Etiology likely secondary to her underlying starvation ketosis, noncompliance with her home medications.  CT head without contrast on admission with chronic right PCA infarct, otherwise no acute intracranial abnormality.  CTA chest negative for PE, no pneumonia or pulmonary infarction and otherwise unrevealing.  CT abdomen/pelvis with mild acute pancreatitis, otherwise unrevealing.  Continue treatment as below.  Severe anion gap metabolic acidosis 2/2 starvation ketosis Nausea/vomiting Patient presenting with confusion, found to have elevated anion gap with associated nausea and vomiting in the setting of poor oral intake.  Onset of symptoms related to recent cholecystectomy on 04/21/2021.  Multiple ED presentations/hospitalizations for similar events following surgical procedure.  Also question if she is compliant with her home medications. --Anion Gap 28>>17>14 --Currently tolerating advance diet --Continue to monitor BMP, magnesium, phosphorus daily  Acute renal failure: Resolved Creatinine elevated 1.26 on arrival.  Likely secondary to prerenal azotemia from dehydration.  Started on IV fluids on arrival with resolution. --Cr 1.26>>0.64>0.71  Refeeding syndrome Patient with poor oral intake since her cholecystectomy on 04/21/2021.  Multiple hospitalizations with nausea/vomiting with family reports of poor oral intake.  Patient was noted to have low potassium, magnesium and phosphorus which have been supplemented. --Continue to monitor BMP, potassium, magnesium, phosphorus daily --Encourage increase oral intake; currently tolerating --Dietitian following, appreciate assistance --Museum/phosphorus within normal limits today, replating potassium  Bipolar disorder Anxiety/depression --Vraylar 6mg  PO daily --Trintellix 20mg  PO daily --Resumed home doxepin at reduced dose 50 mg p.o. nightly  Diarrhea C. difficile negative.  Etiology likely secondary to  malabsorption. --Start cholestyramine BID --Imodium as needed  GERD: Protonix 40 mg p.o. daily  Essential hypertension BP 139/91 this morning --Continue amlodipine 10 mg p.o. daily --Start metoprolol  tartrate 25 mg p.o. twice daily --Labetalol/hydralazine as needed --Continue monitor BP closely  Sinus tachycardia Patient develops elevated heart rate while mobilizing, likely secondary to severe deconditioning versus orthostasis. --1 L NS bolus --Starting metoprolol tartrate as above --Repeat orthostatics in the a.m.  Sacral ulcer, stage II; POA Pressure Injury 07/30/21 Sacrum Mid Stage 2 -  Partial thickness loss of dermis presenting as a shallow open injury with a red, pink wound bed without slough. redness (nonblanchable), open area of skin (Active)  07/30/21 1110  Location: Sacrum  Location Orientation: Mid  Staging: Stage 2 -  Partial thickness loss of dermis presenting as a shallow open injury with a red, pink wound bed without slough.  Wound Description (Comments): redness (nonblanchable), open area of skin  Present on Admission: Yes  --Continue local wound care, offloading   DVT prophylaxis: heparin injection 5,000 Units Start: 07/30/21 1400   Code Status: Full Code Family Communication: Updated patient's daughter on patient's telephone in room this morning  Disposition Plan:  Level of care: Telemetry Status is: Inpatient  Remains inpatient appropriate because:Persistent severe electrolyte disturbances, Altered mental status, Unsafe d/c plan, IV treatments appropriate due to intensity of illness or inability to take PO, and Inpatient level of care appropriate due to severity of illness  Dispo: The patient is from: Home              Anticipated d/c is to:  To be determined              Patient currently is not medically stable to d/c.   Difficult to place patient No  Consultants:  PCCM - signed off 8/17  Procedures:  None  Antimicrobials:  Ceftriaxone 8/14 -  8/14 Metronidazole 8/14 - 8/14   Subjective: Patient seen examined at bedside, resting comfortably.  Eating breakfast.  No further nausea/vomiting and tolerating diet.  Updated patient's daughter who is on telephone/FaceTime this morning.  Patient requesting to discharge home but concerned about mobility status given she has been in the bed for 5 days under ICU level of care.  OT work with patient this morning with significant sinus tachycardia with ambulation.  No other questions or concerns at this time.  Awaiting PT evaluation.  No acute concerns overnight per nursing staff.  Objective: Vitals:   08/02/21 1600 08/02/21 1729 08/02/21 2012 08/03/21 0503  BP: (!) 145/87 (!) 162/98 139/82 (!) 139/91  Pulse: 95 (!) 102 98 96  Resp: (!) 21 20 16 20   Temp:  98.3 F (36.8 C) 98.3 F (36.8 C) (!) 97.3 F (36.3 C)  TempSrc:    Oral  SpO2: 95%  97% 100%  Weight:    118.2 kg  Height:        Intake/Output Summary (Last 24 hours) at 08/03/2021 1333 Last data filed at 08/03/2021 0900 Gross per 24 hour  Intake 300 ml  Output --  Net 300 ml   Filed Weights   07/31/21 0500 08/01/21 0422 08/03/21 0503  Weight: 114.9 kg 115.4 kg 118.2 kg    Examination:  General exam: Appears calm and comfortable Respiratory system: Clear to auscultation. Respiratory effort normal.  On room air Cardiovascular system: S1 & S2 heard, RRR. No JVD, murmurs, rubs, gallops or clicks. No pedal edema. Gastrointestinal system: Abdomen is nondistended, soft and nontender. No organomegaly or masses felt. Normal bowel sounds heard. Central nervous system: Alert and oriented to person/place/time; No focal neurological deficits. Extremities: Moves all extremities independently Skin: Sacral ulcer noted, otherwise no rashes, lesions  or ulcers Psychiatry: Judgement and insight appear poor. Mood & affect appropriate.     Data Reviewed: I have personally reviewed following labs and imaging studies  CBC: Recent Labs   Lab 07/30/21 0226 07/30/21 0843 07/30/21 1705 07/30/21 1717 07/31/21 0312 08/01/21 0246  WBC 20.1* 21.0*  --  18.7* 13.4* 7.3  NEUTROABS 18.0*  --   --  16.8*  --   --   HGB 14.8 13.2  --  12.5 12.3 12.0  HCT 46.1* 41.9  --  40.5 38.2 34.8*  MCV 94.9 94.6  --  94.4 93.6 86.1  PLT 374 302 267 253 214 204   Basic Metabolic Panel: Recent Labs  Lab 07/30/21 0843 07/30/21 1717 07/31/21 0312 07/31/21 1915 08/01/21 0246 08/02/21 0315 08/03/21 0753  NA 143   < > 145 144 143 141 145  K 3.6   < > 3.1* 2.9* 3.2* 3.1* 3.2*  CL 105   < > 114* 105 102 99 101  CO2 9*   < > 15* 21* 24 25 30   GLUCOSE 112*   < > 95 102* 104* 96 89  BUN 20   < > 9 6 <5* <5* 7  CREATININE 1.10*   < > 1.01* 0.85 0.67 0.64 0.71  CALCIUM 9.8   < > 9.4 9.5 8.9 9.2 9.8  MG 2.2  --  2.0  --  1.6* 2.3 2.4  PHOS 2.9  --  <1.0*  --  1.9* 2.7 3.8   < > = values in this interval not displayed.   GFR: Estimated Creatinine Clearance: 100.5 mL/min (by C-G formula based on SCr of 0.71 mg/dL). Liver Function Tests: Recent Labs  Lab 07/30/21 0226 07/30/21 0843  AST 23 19  ALT 27 22  ALKPHOS 141* 128*  BILITOT 1.8* 2.0*  PROT 9.1* 8.0  ALBUMIN 5.5* 4.9   Recent Labs  Lab 07/30/21 0843  LIPASE 322*  AMYLASE 455*   Recent Labs  Lab 07/30/21 0240 07/30/21 0842  AMMONIA 109* 70*   Coagulation Profile: Recent Labs  Lab 07/30/21 0226 07/30/21 0843 07/30/21 1705  INR 1.3* 1.3* 1.3*   Cardiac Enzymes: Recent Labs  Lab 07/30/21 0843  CKTOTAL 141  CKMB 11.3*   BNP (last 3 results) No results for input(s): PROBNP in the last 8760 hours. HbA1C: No results for input(s): HGBA1C in the last 72 hours. CBG: Recent Labs  Lab 07/30/21 0442 07/30/21 2111 07/31/21 1438  GLUCAP 95 107* 95   Lipid Profile: No results for input(s): CHOL, HDL, LDLCALC, TRIG, CHOLHDL, LDLDIRECT in the last 72 hours.  Thyroid Function Tests: No results for input(s): TSH, T4TOTAL, FREET4, T3FREE, THYROIDAB in the last 72  hours. Anemia Panel: No results for input(s): VITAMINB12, FOLATE, FERRITIN, TIBC, IRON, RETICCTPCT in the last 72 hours. Sepsis Labs: Recent Labs  Lab 07/30/21 0226 07/30/21 0842  LATICACIDVEN 1.5 0.8    Recent Results (from the past 240 hour(s))  Blood culture (routine single)     Status: None (Preliminary result)   Collection Time: 07/30/21  2:40 AM   Specimen: BLOOD  Result Value Ref Range Status   Specimen Description   Final    BLOOD RIGHT ANTECUBITAL Performed at [facility], [address], [place], [place] [address]    Special Requests   Final    BOTTLES DRAWN AEROBIC AND ANAEROBIC Blood Culture results may not be optimal due to an inadequate volume of blood received in culture bottles Performed at [facility],  [address], [place], [place] [address]    Culture   Final    NO GROWTH 4 DAYS Performed at [facility], [address] [place], [place] [address]    Report Status PENDING  Incomplete  SARS CORONAVIRUS 2 (TAT 6-24 HRS) Nasopharyngeal Nasopharyngeal Swab     Status: None   Collection Time: 07/30/21 10:22 AM   Specimen: Nasopharyngeal Swab  Result Value Ref Range Status   SARS Coronavirus 2 NEGATIVE NEGATIVE Final    Comment: (NOTE) SARS-CoV-2 target nucleic acids are NOT DETECTED.  The SARS-CoV-2 RNA is generally detectable in upper and lower respiratory specimens during the acute phase of infection. Negative results do not preclude SARS-CoV-2 infection, do not rule out co-infections with other pathogens, and should not be used as the sole basis for treatment or other patient management decisions. Negative results must be combined with clinical observations, patient history, and epidemiological information. The expected result is Negative.  Fact Sheet for Patients: HairSlick.no  Fact Sheet for Healthcare Providers: quierodirigir.com  This test is  not yet approved or cleared by the [place] FDA and  has been authorized for detection and/or diagnosis of SARS-CoV-2 by FDA under an Emergency Use Authorization (EUA). This EUA will remain  in effect (meaning this test can be used) for the duration of the COVID-19 declaration under Se ction 564(b)(1) of the Act, 21 U.S.C. section 360bbb-3(b)(1), unless the authorization is terminated or revoked sooner.  Performed at [facility], [address] [place], [place] [address]   MRSA Next Gen by PCR, Nasal     Status: None   Collection Time: 07/30/21 11:19 AM   Specimen: Nasal Mucosa; Nasal Swab  Result Value Ref Range Status   MRSA by PCR Next Gen NOT DETECTED NOT DETECTED Final    Comment: (NOTE) The GeneXpert MRSA Assay (FDA approved for NASAL specimens only), is one component of a comprehensive MRSA colonization surveillance program. It is not intended to diagnose MRSA infection nor to guide or monitor treatment for MRSA infections. Test performance is not FDA approved in patients less than 33 years old. Performed at [facility], [address], [place], [place] [address]   Urine Culture     Status: None   Collection Time: 07/30/21  2:50 PM   Specimen: In/Out Cath Urine  Result Value Ref Range Status   Specimen Description   Final    IN/OUT CATH URINE Performed at [facility], [address] [place], [place] [address]    Special Requests   Final    NONE Performed at [facility], [address] [place], [place] [address]    Culture   Final    NO GROWTH Performed at [facility], [address], [place], [place] [address]    Report Status 07/31/2021 FINAL  Final  C Difficile Quick Screen (NO PCR Reflex)     Status: None   Collection Time: 08/01/21  4:20 PM   Specimen: STOOL  Result Value Ref Range Status   C Diff antigen NEGATIVE NEGATIVE Final   C Diff toxin NEGATIVE NEGATIVE Final   C Diff  interpretation No C. difficile detected.  Final    Comment: Performed at [facility], [address], [place], [place] [address]         Radiology Studies: No results found.      Scheduled Meds:  amLODipine  10 mg Oral Daily   cariprazine  6 mg Oral Daily   chlorhexidine  15 mL Mouth Rinse BID   Chlorhexidine Gluconate Cloth  6 each Topical Daily   cholestyramine light  4 g Oral 2 times per day   doxepin  50 mg Oral QHS   feeding supplement  237 mL Oral BID BM   folic acid  1 mg Oral Daily   gabapentin  300 mg Oral TID   heparin  5,000 Units Subcutaneous Q8H   mouth rinse  15 mL Mouth Rinse q12n4p   multivitamin with minerals  1 tablet Oral Daily   nutrition supplement (JUVEN)  1 packet Oral BID BM   pantoprazole  40 mg Oral Daily   tapentadol  200 mg Oral BID   thiamine  100 mg Oral Daily   vortioxetine HBr  20 mg Oral Daily   Continuous Infusions:  sodium chloride 10 mL/hr at 08/01/21 0600   sodium chloride       LOS: 4 days    Time spent: 39 minutes spent on chart review, discussion with nursing staff, consultants, updating family and interview/physical exam; more than 50% of that time was spent in counseling and/or coordination of care.    Alvira Philips [place], DO [facility] Available via Epic secure chat 7am-7pm After these hours, please refer to coverage provider listed on [email or web address] 08/03/2021, 1:33 PM

## 2021-08-03 NOTE — Evaluation (Signed)
Physical Therapy Evaluation Patient Details Name: Holly Roberts MRN: [id number] DOB: 1966/12/06 Today's Date: 08/03/2021   History of Present Illness  Patient is a 55 year old female who presented to the hosptial on 8/13 with confusion, decreased oral intake, and nausea. patient was found to have acute toxic metabolic encephalopathy,Severe anion gap metabolic acidosis, acute renal failure, and refeeding syndrome. PMH: Pipolar,HTN, and GERD.    Clinical Impression  Holly Roberts is 55 y.o. female admitted with above HPI and diagnosis. Patient is currently limited by functional impairments below (see PT problem list). Patient lives with her daughter and is independent to ambulate with no device but requires assist for ADL's at baseline. Patient currently requires min guard for safety with transfers and was unable to progress to gait training today due to weak/tired feeling and tachycardia of 132 bpm with sit<>stand. She will benefit from continued skilled PT interventions to address impairments and progress independence with mobility, recommending HHPT at this time; pt may progress and not require follow up services. Acute PT will follow and progress as able.   Orthostatic VS for the past 24 hrs:  BP- Lying Pulse- Lying BP- Sitting Pulse- Sitting BP- Standing at 0 minutes Pulse- Standing at 0 minutes  08/03/21 1300 139/82 101 (!) 153/99 111 (!) 186/92 132        Follow Up Recommendations No PT follow up;Home health PT (TBA; anticipate no follow up if pt progresses vs HHPT)    Equipment Recommendations  Rolling walker with 5" wheels;3in1 (PT) (TBA, pt may progress and not require DME)    Recommendations for Other Services       Precautions / Restrictions Precautions Precautions: Fall Precaution Comments: monitor BP and HR Restrictions Weight Bearing Restrictions: No      Mobility  Bed Mobility Overal bed mobility: Modified Independent             General bed mobility comments:  pt taking extra time and effort    Transfers Overall transfer level: Needs assistance Equipment used: Rolling walker (2 wheeled) Transfers: Sit to/from Stand Sit to Stand: Min guard         General transfer comment: pt using bil UE's for power up and reach back to control lowering. min guard for safety.  Ambulation/Gait                Stairs            Wheelchair Mobility    Modified Rankin (Stroke Patients Only)       Balance Overall balance assessment: Needs assistance Sitting-balance support: No upper extremity supported Sitting balance-Leahy Scale: Good     Standing balance support: During functional activity Standing balance-Leahy Scale: Fair                               Pertinent Vitals/Pain Pain Assessment: 0-10 Pain Score: 8  Pain Location: bottom ("I have a sore on  my butt") and knee and back arthritis Pain Descriptors / Indicators: Aching Pain Intervention(s): Limited activity within patient's tolerance;Repositioned;Monitored during session    Home Living Family/patient expects to be discharged to:: Private residence Living Arrangements: Children Available Help at Discharge: Family;Available 24 hours/day Type of Home: Apartment Home Access: Stairs to enter Entrance Stairs-Rails: None Entrance Stairs-Number of Steps: 3 steps to enter Home Layout: One level Home Equipment: Walker - 4 wheels;Shower seat;Toilet riser Additional Comments: pt lives with her oldest daughter (44 y.o.)    Prior  Function Level of Independence: Needs assistance   Gait / Transfers Assistance Needed: pt sits on stool in shower to bathe  ADL's / Homemaking Assistance Needed: pt can drive but dtr usually does the driving. dtr does the cooking and cleaning. dtrs helps her get showered and dressed.        Hand Dominance   Dominant Hand: Right    Extremity/Trunk Assessment   Upper Extremity Assessment Upper Extremity Assessment: Defer to OT  evaluation    Lower Extremity Assessment Lower Extremity Assessment: Generalized weakness    Cervical / Trunk Assessment Cervical / Trunk Assessment: Normal  Communication   Communication: No difficulties  Cognition Arousal/Alertness: Awake/alert Behavior During Therapy: WFL for tasks assessed/performed Overall Cognitive Status: Within Functional Limits for tasks assessed                                 General Comments: pt names hospital and room number, names date, sity, president      General Comments General comments (skin integrity, edema, etc.): see orthostatic vitals    Exercises     Assessment/Plan    PT Assessment Patient needs continued PT services  PT Problem List Decreased strength;Decreased activity tolerance;Decreased balance;Decreased mobility;Decreased knowledge of use of DME;Decreased safety awareness;Decreased knowledge of precautions;Cardiopulmonary status limiting activity       PT Treatment Interventions DME instruction;Gait training;Stair training;Functional mobility training;Therapeutic activities;Therapeutic exercise;Balance training;Patient/family education    PT Goals (Current goals can be found in the Care Plan section)  Acute Rehab PT Goals Patient Stated Goal: to go home today PT Goal Formulation: With patient Time For Goal Achievement: 08/17/21 Potential to Achieve Goals: Good    Frequency Min 3X/week   Barriers to discharge        Co-evaluation               AM-PAC PT "6 Clicks" Mobility  Outcome Measure Help needed turning from your back to your side while in a flat bed without using bedrails?: None Help needed moving from lying on your back to sitting on the side of a flat bed without using bedrails?: None Help needed moving to and from a bed to a chair (including a wheelchair)?: A Little Help needed standing up from a chair using your arms (e.g., wheelchair or bedside chair)?: A Little Help needed to walk in  hospital room?: A Little Help needed climbing 3-5 steps with a railing? : A Lot 6 Click Score: 19    End of Session Equipment Utilized During Treatment: Gait belt Activity Tolerance: Patient tolerated treatment well Patient left: with call bell/phone within reach;in bed;with bed alarm set Nurse Communication: Mobility status PT Visit Diagnosis: Muscle weakness (generalized) (M62.81);Difficulty in walking, not elsewhere classified (R26.2);Unsteadiness on feet (R26.81)    Time: 1255-1316 PT Time Calculation (min) (ACUTE ONLY): 21 min   Charges:   PT Evaluation $PT Eval Moderate Complexity: 1 Mod          Wynn Maudlin, DPT Acute Rehabilitation Services Office [phone] Pager [phone]   Anitra Lauth 08/03/2021, 2:15 PM

## 2021-08-03 NOTE — Evaluation (Signed)
Occupational Therapy Evaluation Patient Details Name: Holly Roberts MRN: [id number] DOB: Feb 19, 1966 Today's Date: 08/03/2021    History of Present Illness patient is a 55 year old female who presented to the hosptial on 8/13 with confusion, decreased oral intake, and nausea. patient was found to have acute toxic metabolic encephalopathy,Severe anion gap metabolic acidosis, acute renal failure, and refeeding syndrome. PMH: bipolar,HTN, and GERD   Clinical Impression   Patient is a 55 year old female who was admitted to the hospital for above. Patient was previously living at home with daughter. Currently patient is min guard for mobility and max A for LB dressing tasks. Patients HR was noted to increase to 130-144bpm with mobility to bathroom. Patient would continue to benefit from skilled OT services at this time while admitted  to address noted deficits in order to improve overall safety and independence in ADLs.      Follow Up Recommendations  No OT follow up    Equipment Recommendations  None recommended by OT    Recommendations for Other Services       Precautions / Restrictions Precautions Precautions: Fall Precaution Comments: monitor HR Restrictions Weight Bearing Restrictions: No      Mobility Bed Mobility Overal bed mobility: Modified Independent                  Transfers Overall transfer level: Needs assistance Equipment used: Rolling walker (2 wheeled) Transfers: Sit to/from Stand Sit to Stand: Min guard         General transfer comment: patient needed cues to slow down with cues for tasks.    Balance Overall balance assessment: Needs assistance Sitting-balance support: No upper extremity supported Sitting balance-Leahy Scale: Good     Standing balance support: During functional activity Standing balance-Leahy Scale: Fair                             ADL either performed or assessed with clinical judgement   ADL Overall ADL's :  Needs assistance/impaired Eating/Feeding: Set up;Sitting   Grooming: Wash/dry face;Oral care;Set up;Sitting   Upper Body Bathing: Set up;Sitting   Lower Body Bathing: Sitting/lateral leans;Minimal assistance   Upper Body Dressing : Set up;Sitting   Lower Body Dressing: Moderate assistance;Sit to/from stand Lower Body Dressing Details (indicate cue type and reason): patient reported patients daughter dons socks for her at home. Toilet Transfer: Min guard;Ambulation;RW Toilet Transfer Details (indicate cue type and reason): patients HR was ntoed to increased from 113 bpm sitting on edge of bed to 130-144 bpm with ambulation. patient once HR reaches 136 bpm requests to sit down consistently. Toileting- Architect and Hygiene: Min guard;Sit to/from stand       Functional mobility during ADLs: Min guard;Rolling walker       Vision Patient Visual Report: No change from baseline       Perception     Praxis      Pertinent Vitals/Pain Pain Assessment: No/denies pain     Hand Dominance Right   Extremity/Trunk Assessment Upper Extremity Assessment Upper Extremity Assessment: RUE deficits/detail;LUE deficits/detail RUE Deficits / Details: patient was able to abduct bilateral shoulder to about 80 degrees. with AROM. patient reported no previous injuries.       Cervical / Trunk Assessment Cervical / Trunk Assessment: Normal   Communication Communication Communication: No difficulties   Cognition Arousal/Alertness: Awake/alert Behavior During Therapy: WFL for tasks assessed/performed Overall Cognitive Status: Within Functional Limits for tasks assessed  General Comments  patients HR was noted to increased from 113 bpm sitting on edge of bed to 130-144 bpm with ambulation. patient once HR reaches 130 bpm requests to sit down consistently with testing during session.patient is unable to describe why but repoted  needing to sit down with increased HR.    Exercises     Shoulder Instructions      Home Living Family/patient expects to be discharged to:: Private residence Living Arrangements: Children Available Help at Discharge: Family;Available 24 hours/day Type of Home: Apartment Home Access: Stairs to enter [employer] of Steps: 3 steps to enter Entrance Stairs-Rails: None Home Layout: One level     Bathroom Shower/Tub: Chief Strategy Officer: Standard     Home Equipment: Environmental consultant - 4 wheels;Shower seat;Toilet riser          Prior Functioning/Environment Level of Independence: Needs assistance  Gait / Transfers Assistance Needed: uses rollator for functional mobility ADL's / Homemaking Assistance Needed: patient daughter helps with cooking, cleaning and driving and LB dressing.patient indicated not being able to stand long.            OT Problem List: Decreased strength;Decreased range of motion;Cardiopulmonary status limiting activity;Impaired balance (sitting and/or standing);Decreased safety awareness;Decreased activity tolerance      OT Treatment/Interventions: Self-care/ADL training;Therapeutic exercise;Energy conservation;DME and/or AE instruction;Cognitive remediation/compensation;Balance training;Patient/family education    OT Goals(Current goals can be found in the care plan section) Acute Rehab OT Goals Patient Stated Goal: to go home today OT Goal Formulation: With patient Time For Goal Achievement: 08/17/21 Potential to Achieve Goals: Good  OT Frequency: Min 2X/week   Barriers to D/C:            Co-evaluation              AM-PAC OT "6 Clicks" Daily Activity     Outcome Measure Help from another person eating meals?: None Help from another person taking care of personal grooming?: A Little Help from another person toileting, which includes using toliet, bedpan, or urinal?: A Little Help from another person bathing (including  washing, rinsing, drying)?: A Little Help from another person to put on and taking off regular upper body clothing?: None Help from another person to put on and taking off regular lower body clothing?: A Little 6 Click Score: 20   End of Session Equipment Utilized During Treatment: Gait belt;Rolling walker Nurse Communication: Mobility status;Other (comment) (and HR increase with ambulation)  Activity Tolerance: Patient tolerated treatment well Patient left: in bed;with call bell/phone within reach  OT Visit Diagnosis: Muscle weakness (generalized) (M62.81);Unsteadiness on feet (R26.81)                Time: 1610-9604 OT Time Calculation (min): 23 min Charges:  OT General Charges $OT Visit: 1 Visit OT Evaluation $OT Eval Low Complexity: 1 Low OT Treatments $Self Care/Home Management : 8-22 mins  Holly Roberts OTR/L, MS Acute Rehabilitation Department Office# [phone] Pager# [phone]   [name] Samone Guhl 08/03/2021, 10:32 AM

## 2021-08-04 LAB — BASIC METABOLIC PANEL
Anion gap: 7 (ref 5–15)
BUN: 6 mg/dL (ref 6–20)
CO2: 30 mmol/L (ref 22–32)
Calcium: 8.8 mg/dL — ABNORMAL LOW (ref 8.9–10.3)
Chloride: 104 mmol/L (ref 98–111)
Creatinine, Ser: 0.63 mg/dL (ref 0.44–1.00)
GFR, Estimated: >60 mL/min (ref >60)
Glucose, Bld: 88 mg/dL (ref 70–99)
Potassium: 3.6 mmol/L (ref 3.5–5.1)
Sodium: 141 mmol/L (ref 135–145)

## 2021-08-04 LAB — GLUCOSE, CAPILLARY
Glucose-Capillary: 88 mg/dL (ref 70–99)
Glucose-Capillary: 115 mg/dL — ABNORMAL HIGH (ref 70–99)

## 2021-08-04 LAB — MAGNESIUM: Magnesium: 2.1 mg/dL (ref 1.7–2.4)

## 2021-08-04 LAB — PHOSPHORUS: Phosphorus: 4.4 mg/dL (ref 2.5–4.6)

## 2021-08-04 MED ORDER — POTASSIUM CHLORIDE CRYS ER 10 MEQ PO TBCR
40.0000 meq | EXTENDED_RELEASE_TABLET | Freq: Once | ORAL | Status: AC
  Administered 2021-08-04: 40 meq via ORAL
  Filled 2021-08-04: qty 4

## 2021-08-04 MED ORDER — METOPROLOL TARTRATE 50 MG PO TABS
50.0000 mg | ORAL_TABLET | Freq: Two times a day (BID) | ORAL | Status: DC
  Administered 2021-08-04 – 2021-08-10 (×12): 50 mg via ORAL
  Filled 2021-08-04 (×12): qty 1

## 2021-08-04 MED ORDER — METOPROLOL TARTRATE 25 MG PO TABS
25.0000 mg | ORAL_TABLET | ORAL | Status: AC
  Administered 2021-08-04: 25 mg via ORAL
  Filled 2021-08-04: qty 1

## 2021-08-04 MED ORDER — TAPENTADOL HCL ER 100 MG PO TB12
200.0000 mg | ORAL_TABLET | Freq: Three times a day (TID) | ORAL | Status: DC
  Administered 2021-08-04 – 2021-08-10 (×19): 200 mg via ORAL
  Filled 2021-08-04 (×18): qty 2

## 2021-08-04 NOTE — NC FL2 (Signed)
[facility] [place] MEDICAID FL2 LEVEL OF CARE SCREENING TOOL     IDENTIFICATION  Patient Name: Holly Roberts Birthdate: [birth year] Sex: female Admission Date (Current Location): 07/29/2021  [facility] and [place] Number:  Producer, television/film/video and Address:  [facility],  [address] [place], [place] [address]      Provider Number: [id number]  Attending Physician Name and Address:  U[place], Eric J, DO  Relative Name and Phone Number:  laurey, fasolino (Daughter)   [phone]    Current Level of Care: Hospital Recommended Level of Care: Skilled Nursing Facility Prior Approval Number:    Date Approved/Denied:   PASRR Number:    Discharge Plan: SNF    Current Diagnoses: Patient Active Problem List   Diagnosis Date Noted   Pressure injury of skin 07/31/2021   Acute encephalopathy 07/30/2021   AKI (acute kidney injury) (HCC) 06/09/2021   Hypoglycemia without diagnosis of diabetes mellitus 06/09/2021   Elevated lipase 06/09/2021   Bipolar disorder (HCC) 06/09/2021   Depression 06/09/2021   Anxiety 06/09/2021   Chronic pain 06/09/2021   Metabolic acidosis 06/09/2021   Vomiting and diarrhea 06/09/2021   Cholecystitis with cholelithiasis 04/19/2021    Orientation RESPIRATION BLADDER Height & Weight     Self, Time, Situation, Place  Normal Continent Weight: 119.1 kg Height:  5\' 4"  (162.6 cm)  BEHAVIORAL SYMPTOMS/MOOD NEUROLOGICAL BOWEL NUTRITION STATUS   (none)  (none) Continent Diet (see d/c summary)  AMBULATORY STATUS COMMUNICATION OF NEEDS Skin   Extensive Assist Verbally Other (Comment) (Pressure injury, Mid Sacrum; Moisture Associated Skin Damage, Bi-lateral buttocks)                       Personal Care Assistance Level of Assistance  Bathing, Feeding, Dressing Bathing Assistance: Maximum assistance Feeding assistance: Independent Dressing Assistance: Limited assistance     Functional Limitations Info  Sight, Hearing, Speech Sight Info: Adequate Hearing  Info: Adequate Speech Info: Adequate    SPECIAL CARE FACTORS FREQUENCY                       Contractures Contractures Info: Not present    Additional Factors Info  Code Status, Allergies Code Status Info: full Allergies Info: Buprenorphine, Amoxicillin, Adhesive, Sulfa Abx           Current Medications (08/04/2021):  This is the current hospital active medication list Current Facility-Administered Medications  Medication Dose Route Frequency Provider Last Rate Last Admin   0.9 %  sodium chloride infusion   Intravenous PRN [name], MD 10 mL/hr at 08/01/21 0600 Infusion Verify at 08/01/21 0600   amLODipine (NORVASC) tablet 10 mg  10 mg Oral Daily Hunsucker, Lesia Sago, MD   10 mg at 08/04/21 1010   cariprazine (VRAYLAR) capsule 6 mg  6 mg Oral Daily Hunsucker, Lesia Sago, MD   6 mg at 08/04/21 1012   chlorhexidine (PERIDEX) 0.12 % solution 15 mL  15 mL Mouth Rinse BID [name], MD   15 mL at 08/04/21 1012   Chlorhexidine Gluconate Cloth 2 % PADS 6 each  6 each Topical Daily [name], MD   6 each at 08/04/21 1014   cholestyramine light (PREVALITE) packet 4 g  4 g Oral 2 times per day [place], Alvira Philips, DO   4 g at 08/03/21 1715   docusate sodium (COLACE) capsule 100 mg  100 mg Oral BID PRN [name], MD       doxepin (SINEQUAN) capsule 50 mg  50 mg Oral QHS [place], Eric J, DO   50 mg at 08/03/21 2113   feeding supplement (ENSURE ENLIVE / ENSURE PLUS) liquid 237 mL  237 mL Oral BID BM [place], Eric J, DO   237 mL at 08/04/21 1014   folic acid (FOLVITE) tablet 1 mg  1 mg Oral Daily [place], Alvira Philips, DO   1 mg at 08/04/21 1011   gabapentin (NEURONTIN) capsule 300 mg  300 mg Oral TID [place], Eric J, DO   300 mg at 08/04/21 1011   Gerhardt's butt cream   Topical PRN Hunsucker, Lesia Sago, MD       heparin injection 5,000 Units  5,000 Units Subcutaneous Q8H [name], MD   5,000 Units at 08/04/21 0603   hydrALAZINE (APRESOLINE) injection 10 mg   10 mg Intravenous Q4H PRN Jeannette Corpus T, MD   10 mg at 08/02/21 0319   labetalol (NORMODYNE) injection 20 mg  20 mg Intravenous Q4H PRN Larinda Buttery, MD   20 mg at 08/02/21 0835   loperamide (IMODIUM) capsule 2 mg  2 mg Oral Q6H PRN Migdalia Dk, MD   2 mg at 08/02/21 0117   LORazepam (ATIVAN) tablet 0.5 mg  0.5 mg Oral Q6H PRN [place], Eric J, DO   0.5 mg at 08/04/21 1010   MEDLINE mouth rinse  15 mL Mouth Rinse q12n4p [name], MD   15 mL at 08/03/21 1513   melatonin tablet 3 mg  3 mg Oral QHS PRN Migdalia Dk, MD   3 mg at 08/04/21 0047   metoprolol tartrate (LOPRESSOR) tablet 25 mg  25 mg Oral BID [place], Eric J, DO   25 mg at 08/04/21 1011   multivitamin with minerals tablet 1 tablet  1 tablet Oral Daily [place], Eric J, DO   1 tablet at 08/04/21 1011   nutrition supplement (JUVEN) (JUVEN) powder packet 1 packet  1 packet Oral BID BM [place], Alvira Philips, DO   1 packet at 08/04/21 1010   ondansetron (ZOFRAN) injection 4 mg  4 mg Intravenous Q6H PRN [place], Eric J, DO   4 mg at 08/03/21 1421   pantoprazole (PROTONIX) EC tablet 40 mg  40 mg Oral Daily [place], Alvira Philips, DO   40 mg at 08/04/21 1010   polyethylene glycol (MIRALAX / GLYCOLAX) packet 17 g  17 g Oral Daily PRN [name], MD       tapentadol (NUCYNTA) 12 hr tablet 200 mg  200 mg Oral BID [place], Alvira Philips, DO   200 mg at 08/04/21 1011   thiamine tablet 100 mg  100 mg Oral Daily [place], Alvira Philips, DO   100 mg at 08/04/21 1010   traZODone (DESYREL) tablet 50 mg  50 mg Oral QHS PRN [place], Eric J, DO   50 mg at 08/03/21 2112   vortioxetine HBr (TRINTELLIX) tablet 20 mg  20 mg Oral Daily Hunsucker, Lesia Sago, MD   20 mg at 08/04/21 1012     Discharge Medications: Please see discharge summary for a list of discharge medications.  Relevant Imaging Results:  Relevant Lab Results:   Additional Information [phone] [employer], [place]

## 2021-08-04 NOTE — Progress Notes (Signed)
Physical Therapy Treatment Patient Details Name: Holly Roberts MRN: [id number] DOB: [birth year] Today's Date: 08/04/2021    History of Present Illness Patient is a 55 year old female who presented to the hosptial on 8/13 with confusion, decreased oral intake, and nausea. patient was found to have acute toxic metabolic encephalopathy,Severe anion gap metabolic acidosis, acute renal failure, and refeeding syndrome. PMH: Pipolar,HTN, and GERD    PT Comments    Patient continues to remain limited by generalized pain, fatigue, and poor activity tolerance. VS taken with mobility and pt hypertensive with orthostatics and tachycardic (see below). BP decreased at EOS to 1582/90 with seated rest after ~10' of ambulation. Pt required min assist/guard for safety with RW during short gait bout. Discussed concerns regarding limited mobility and need for additional therapy to address impairments to improve health and safety at home. Pt and daughters discussed and agreeable to ST rehab at [facility]. Acute PT will continue to progress pt as able.     08/04/21 0939  Orthostatic Lying   BP- Lying 151/86  Pulse- Lying 97  Orthostatic Sitting  BP- Sitting (!) 151/94 (@941 )  Pulse- Sitting 104  Orthostatic Standing at 0 minutes  BP- Standing at 0 minutes (!) 171/111 (@945 )  Pulse- Standing at 0 minutes 121  Orthostatic Standing at 3 minutes  BP- Standing at 3 minutes (!) 211/99 (@948 )  Pulse- Standing at 3 minutes 138   After Rest   08/04/21 0955  Vital Signs  Pulse Rate (!) 102  Pulse Rate Source Dinamap  BP (!) 152/90  BP Location Left Arm (forearm)  BP Method Automatic  Patient Position (if appropriate) Sitting      Follow Up Recommendations  SNF (pt's daughter/pt discussed plan and pt is ready to pursue SNF for ST rehab)     Equipment Recommendations  Rolling walker with 5" wheels;3in1 (PT) (TBA, pt may progress and not require DME)    Recommendations for Other Services        Precautions / Restrictions Precautions Precautions: Fall Precaution Comments: monitor BP and HR Restrictions Weight Bearing Restrictions: No    Mobility  Bed Mobility Overal bed mobility: Modified Independent             General bed mobility comments: pt taking extra time and effort    Transfers Overall transfer level: Needs assistance Equipment used: Rolling walker (2 wheeled) Transfers: Sit to/from Stand Sit to Stand: Min guard         General transfer comment: pt using bil UE's for power up and reach back to control lowering. min guard for safety.  Ambulation/Gait Ambulation/Gait assistance: Min assist;Min guard Gait Distance (Feet): 10 Feet Assistive device: Rolling walker (2 wheeled) Gait Pattern/deviations: Step-through pattern;Decreased stride length;Shuffle Gait velocity: decr   General Gait Details: cues to keep RW clsoer during gait, guarding for safety with assit for walker occasionally during short ambulation in room. pt easily fatigued and distance limited by hypertension/teachycardia.   Stairs             Wheelchair Mobility    Modified Rankin (Stroke Patients Only)       Balance Overall balance assessment: Needs assistance Sitting-balance support: No upper extremity supported Sitting balance-Leahy Scale: Good     Standing balance support: During functional activity Standing balance-Leahy Scale: Fair                              Cognition Arousal/Alertness: Awake/alert Behavior During Therapy: [facility]  for tasks assessed/performed Overall Cognitive Status: Within Functional Limits for tasks assessed                                        Exercises      General Comments        Pertinent Vitals/Pain Pain Assessment: Faces Faces Pain Scale: Hurts even more Pain Location: bottom ("I have a sore on  my butt") and knee and back arthritis Pain Descriptors / Indicators: Aching Pain Intervention(s):  Monitored during session;Limited activity within patient's tolerance;Repositioned;Patient requesting pain meds-RN notified    Home Living                      Prior Function            PT Goals (current goals can now be found in the care plan section) Acute Rehab PT Goals Patient Stated Goal: to go home today PT Goal Formulation: With patient Time For Goal Achievement: 08/17/21 Potential to Achieve Goals: Good Progress towards PT goals: Progressing toward goals    Frequency    Min 3X/week      PT Plan Discharge plan needs to be updated    Co-evaluation              AM-PAC PT "6 Clicks" Mobility   Outcome Measure  Help needed turning from your back to your side while in a flat bed without using bedrails?: None Help needed moving from lying on your back to sitting on the side of a flat bed without using bedrails?: None Help needed moving to and from a bed to a chair (including a wheelchair)?: A Little Help needed standing up from a chair using your arms (e.g., wheelchair or bedside chair)?: A Little Help needed to walk in Roberts room?: A Little Help needed climbing 3-5 steps with a railing? : A Lot 6 Click Score: 19    End of Session Equipment Utilized During Treatment: Gait belt Activity Tolerance: Patient tolerated treatment well Patient left: with call bell/phone within reach;in bed;with bed alarm set Nurse Communication: Mobility status PT Visit Diagnosis: Muscle weakness (generalized) (M62.81);Difficulty in walking, not elsewhere classified (R26.2);Unsteadiness on feet (R26.81)     Time: 0865-7846 PT Time Calculation (min) (ACUTE ONLY): 25 min  Charges:  $Gait Training: 8-22 mins $Therapeutic Activity: 8-22 mins                     Holly Roberts, DPT Acute Rehabilitation Services Office [phone] Pager [phone]    Holly Roberts 08/04/2021, 10:38 AM

## 2021-08-04 NOTE — Progress Notes (Signed)
PROGRESS NOTE    Holly Roberts  XBJ:[id number] DOB: 02/16/1966 DOA: 07/29/2021 PCP: Pcp, No    Brief Narrative:  Holly Roberts is a 55 year old female with past medical history significant for bipolar disorder, anxiety/depression, chronic pain syndrome, morbid obesity who presented to [facility] ED on 8/13 with nausea, decreased oral intake, confusion.  Daughter reports poor oral intake to include liquids/solids following laparoscopic cholecystectomy.  Originally admitted on 04/20/2021 for cold cholecystitis s/p laparoscopic cholecystectomy on 5/6 with postoperative course complicated by postcholecystectomy diarrhea in which she was started on cholestyramine.  Patient was readmitted on 6/24 2022 with 4-day history of vomiting/diarrhea and poor oral intake associated with a lipase of 263, AKI and UTI with high anion gap metabolic acidosis, hypoglycemia, hyperbilirubinemia; which illness resolved with IV fluid therapy and subsequent discharge on 06/10/2021.  Patient then represented to the ED on 07/24/2021 with emesis, abdominal pain, diarrhea with anion gap elevated to 16 and symptoms resolved with IV fluids and was subsequently discharged.  In the ED, patient was noted to be tachycardic, tachycardia hypnic, afebrile with normal SPO2.  Severe anion gap metabolic acidosis of 28 with albumin 5.5, slightly elevated ammonia level but negative salicylate.  Normal lactic acid, normal Tylenol level and normal kidney function.  EDP started omeprazole after discussing with poison control and she received 3 A of sodium bicarbonate, given 1 dose of empiric antibiotics and 1 L of IV fluid.  Patient was initially admitted to the [facility] service for severe anion gap metabolic acidosis with associated nausea and vomiting likely secondary to starvation ketosis.  Patient transferred to [facility] on 08/02/2021.   Assessment & Plan:   Active Problems:   Metabolic acidosis   Acute encephalopathy   Pressure injury of skin   Acute toxic  metabolic encephalopathy; POA: Improving Etiology likely secondary to her underlying starvation ketosis, noncompliance with her home medications.  CT head without contrast on admission with chronic right PCA infarct, otherwise no acute intracranial abnormality.  CTA chest negative for PE, no pneumonia or pulmonary infarction and otherwise unrevealing.  CT abdomen/pelvis with mild acute pancreatitis, otherwise unrevealing.  Continue treatment as below.  Severe anion gap metabolic acidosis 2/2 starvation ketosis: Resolved Nausea/vomiting Patient presenting with confusion, found to have elevated anion gap with associated nausea and vomiting in the setting of poor oral intake.  Onset of symptoms related to recent cholecystectomy on 04/21/2021.  Multiple ED presentations/hospitalizations for similar events following surgical procedure.  Also question if she is compliant with her home medications. --Anion Gap 28>>17>14>7 --Currently tolerating advance diet --Zofran as needed for nausea --Continue to monitor BMP, magnesium, phosphorus daily  Acute renal failure: Resolved Creatinine elevated 1.26 on arrival.  Likely secondary to prerenal azotemia from dehydration.  Started on IV fluids on arrival with resolution. --Cr 1.26>>0.64>0.71>0.63  Refeeding syndrome Patient with poor oral intake since her cholecystectomy on 04/21/2021.  Multiple hospitalizations with nausea/vomiting with family reports of poor oral intake.  Patient was noted to have low potassium, magnesium and phosphorus which have been supplemented. --Encourage increase oral intake; currently tolerating --Dietitian following, appreciate assistance --Museum/phosphorus within normal limits today, replating potassium  Bipolar disorder Anxiety/depression --Vraylar 6mg  PO daily --Trintellix 20mg  PO daily --Resumed home doxepin at reduced dose 50 mg p.o. nightly  Diarrhea C. difficile negative.  Etiology likely secondary to  malabsorption. --Started cholestyramine BID --Imodium as needed  GERD: Protonix 40 mg p.o. daily  Essential hypertension BP 106/64 this morning --Continue amlodipine 10 mg p.o. daily --Increase metoprolol tartrate to 50  mg p.o. twice daily --Labetalol/hydralazine as needed --Continue monitor BP closely  Sinus tachycardia Patient develops elevated heart rate while mobilizing, likely secondary to severe deconditioning versus orthostasis. --Increasing metoprolol tartrate to 50 mg p.o. twice daily today --Repeat orthostatics in the a.m.  Sacral ulcer, stage II; POA Pressure Injury 07/30/21 Sacrum Mid Stage 2 -  Partial thickness loss of dermis presenting as a shallow open injury with a red, pink wound bed without slough. redness (nonblanchable), open area of skin (Active)  07/30/21 1110  Location: Sacrum  Location Orientation: Mid  Staging: Stage 2 -  Partial thickness loss of dermis presenting as a shallow open injury with a red, pink wound bed without slough.  Wound Description (Comments): redness (nonblanchable), open area of skin  Present on Admission: Yes  --Continue local wound care, offloading   DVT prophylaxis: heparin injection 5,000 Units Start: 07/30/21 1400   Code Status: Full Code Family Communication: No family present at bedside this morning, updated patient's daughter on telephone yesterday  Disposition Plan:  Level of care: Telemetry Status is: Inpatient  Remains inpatient appropriate because:Persistent severe electrolyte disturbances, Altered mental status, Unsafe d/c plan, IV treatments appropriate due to intensity of illness or inability to take PO, and Inpatient level of care appropriate due to severity of illness  Dispo: The patient is from: Home              Anticipated d/c is to: SNF              Patient currently is medically stable to d/c.   Difficult to place patient No  Consultants:  PCCM - signed off 8/17  Procedures:  None  Antimicrobials:   Ceftriaxone 8/14 - 8/14 Metronidazole 8/14 - 8/14   Subjective: Patient seen examined at bedside, resting comfortably.  Feels overall much improved today.  Worked with PT continues with elevated heart rate on ambulation, likely secondary to severe deconditioning.  Patient now agreeable for SNF placement, social work for assistance.  No other questions or concerns at this time.  Denies headache, no chest pain, no palpitations, no shortness of breath, no abdominal pain.  No acute events overnight per nursing staff.  Objective: Vitals:   08/03/21 1509 08/03/21 2015 08/04/21 0451 08/04/21 0955  BP: 132/85 135/86 106/64 (!) 152/90  Pulse: 95 (!) 101 84 (!) 102  Resp: 20 20 20    Temp: 98.3 F (36.8 C) 98.3 F (36.8 C) 99 F (37.2 C)   TempSrc: Oral Oral Oral   SpO2: 99% 99% 93%   Weight:   119.1 kg   Height:        Intake/Output Summary (Last 24 hours) at 08/04/2021 1301 Last data filed at 08/03/2021 1500 Gross per 24 hour  Intake 321.92 ml  Output --  Net 321.92 ml   Filed Weights   08/01/21 0422 08/03/21 0503 08/04/21 0451  Weight: 115.4 kg 118.2 kg 119.1 kg    Examination:  General exam: Appears calm and comfortable Respiratory system: Clear to auscultation. Respiratory effort normal.  On room air Cardiovascular system: S1 & S2 heard, RRR. No JVD, murmurs, rubs, gallops or clicks. No pedal edema. Gastrointestinal system: Abdomen is nondistended, soft and nontender. No organomegaly or masses felt. Normal bowel sounds heard. Central nervous system: Alert and oriented to person/place/time; No focal neurological deficits. Extremities: Moves all extremities independently Skin: Sacral ulcer noted, otherwise no rashes, lesions or ulcers Psychiatry: Judgement and insight appear poor. Mood & affect appropriate.     Data Reviewed: I have  personally reviewed following labs and imaging studies  CBC: Recent Labs  Lab 07/30/21 0226 07/30/21 0843 07/30/21 1705 07/30/21 1717  07/31/21 0312 08/01/21 0246  WBC 20.1* 21.0*  --  18.7* 13.4* 7.3  NEUTROABS 18.0*  --   --  16.8*  --   --   HGB 14.8 13.2  --  12.5 12.3 12.0  HCT 46.1* 41.9  --  40.5 38.2 34.8*  MCV 94.9 94.6  --  94.4 93.6 86.1  PLT 374 302 267 253 214 204   Basic Metabolic Panel: Recent Labs  Lab 07/31/21 0312 07/31/21 1915 08/01/21 0246 08/02/21 0315 08/03/21 0753 08/04/21 0525  NA 145 144 143 141 145 141  K 3.1* 2.9* 3.2* 3.1* 3.2* 3.6  CL 114* 105 102 99 101 104  CO2 15* 21* 24 25 30 30   GLUCOSE 95 102* 104* 96 89 88  BUN 9 6 <5* <5* 7 6  CREATININE 1.01* 0.85 0.67 0.64 0.71 0.63  CALCIUM 9.4 9.5 8.9 9.2 9.8 8.8*  MG 2.0  --  1.6* 2.3 2.4 2.1  PHOS <1.0*  --  1.9* 2.7 3.8 4.4   GFR: Estimated Creatinine Clearance: 101 mL/min (by C-G formula based on SCr of 0.63 mg/dL). Liver Function Tests: Recent Labs  Lab 07/30/21 0226 07/30/21 0843  AST 23 19  ALT 27 22  ALKPHOS 141* 128*  BILITOT 1.8* 2.0*  PROT 9.1* 8.0  ALBUMIN 5.5* 4.9   Recent Labs  Lab 07/30/21 0843  LIPASE 322*  AMYLASE 455*   Recent Labs  Lab 07/30/21 0240 07/30/21 0842  AMMONIA 109* 70*   Coagulation Profile: Recent Labs  Lab 07/30/21 0226 07/30/21 0843 07/30/21 1705  INR 1.3* 1.3* 1.3*   Cardiac Enzymes: Recent Labs  Lab 07/30/21 0843  CKTOTAL 141  CKMB 11.3*   BNP (last 3 results) No results for input(s): PROBNP in the last 8760 hours. HbA1C: No results for input(s): HGBA1C in the last 72 hours. CBG: Recent Labs  Lab 07/30/21 0442 07/30/21 2111 07/31/21 1438 08/04/21 0732 08/04/21 1142  GLUCAP 95 107* 95 88 115*   Lipid Profile: No results for input(s): CHOL, HDL, LDLCALC, TRIG, CHOLHDL, LDLDIRECT in the last 72 hours.  Thyroid Function Tests: No results for input(s): TSH, T4TOTAL, FREET4, T3FREE, THYROIDAB in the last 72 hours. Anemia Panel: No results for input(s): VITAMINB12, FOLATE, FERRITIN, TIBC, IRON, RETICCTPCT in the last 72 hours. Sepsis Labs: Recent Labs   Lab 07/30/21 0226 07/30/21 0842  LATICACIDVEN 1.5 0.8    Recent Results (from the past 240 hour(s))  Blood culture (routine single)     Status: Abnormal (Preliminary result)   Collection Time: 07/30/21  2:40 AM   Specimen: BLOOD  Result Value Ref Range Status   Specimen Description   Final    BLOOD RIGHT ANTECUBITAL Performed at [facility], [address], [place], [place] [address]    Special Requests   Final    BOTTLES DRAWN AEROBIC AND ANAEROBIC Blood Culture results may not be optimal due to an inadequate volume of blood received in culture bottles Performed at [facility], [address] [place], [place] [address]    Culture  Setup Time   Final    GRAM POSITIVE COCCI IN CLUSTERS AEROBIC BOTTLE ONLY Organism ID to follow CRITICAL RESULT CALLED TO, READ BACK BY AND VERIFIED WITH: Sophronia Simas PHARMD 1900 08/03/21 A BROWNING    Culture (A)  Final    MICROCOCCUS LUTEUS/LYLAE THE SIGNIFICANCE OF ISOLATING  THIS ORGANISM FROM A SINGLE SET OF BLOOD CULTURES WHEN MULTIPLE SETS ARE DRAWN IS UNCERTAIN. PLEASE NOTIFY THE MICROBIOLOGY DEPARTMENT WITHIN ONE WEEK IF SPECIATION AND SENSITIVITIES ARE REQUIRED. Performed at [facility], [address], [place], [place] [address]    Report Status PENDING  Incomplete  Blood Culture ID Panel (Reflexed)     Status: None   Collection Time: 07/30/21  2:40 AM  Result Value Ref Range Status   Enterococcus faecalis NOT DETECTED NOT DETECTED Final   Enterococcus Faecium NOT DETECTED NOT DETECTED Final   Listeria monocytogenes NOT DETECTED NOT DETECTED Final   Staphylococcus species NOT DETECTED NOT DETECTED Final   Staphylococcus aureus (BCID) NOT DETECTED NOT DETECTED Final   Staphylococcus epidermidis NOT DETECTED NOT DETECTED Final   Staphylococcus lugdunensis NOT DETECTED NOT DETECTED Final   Streptococcus species NOT DETECTED NOT DETECTED Final   Streptococcus agalactiae NOT DETECTED NOT DETECTED  Final   Streptococcus pneumoniae NOT DETECTED NOT DETECTED Final   Streptococcus pyogenes NOT DETECTED NOT DETECTED Final   A.calcoaceticus-baumannii NOT DETECTED NOT DETECTED Final   Bacteroides fragilis NOT DETECTED NOT DETECTED Final   Enterobacterales NOT DETECTED NOT DETECTED Final   Enterobacter cloacae complex NOT DETECTED NOT DETECTED Final   Escherichia coli NOT DETECTED NOT DETECTED Final   Klebsiella aerogenes NOT DETECTED NOT DETECTED Final   Klebsiella oxytoca NOT DETECTED NOT DETECTED Final   Klebsiella pneumoniae NOT DETECTED NOT DETECTED Final   Proteus species NOT DETECTED NOT DETECTED Final   Salmonella species NOT DETECTED NOT DETECTED Final   Serratia marcescens NOT DETECTED NOT DETECTED Final   Haemophilus influenzae NOT DETECTED NOT DETECTED Final   Neisseria meningitidis NOT DETECTED NOT DETECTED Final   Pseudomonas aeruginosa NOT DETECTED NOT DETECTED Final   Stenotrophomonas maltophilia NOT DETECTED NOT DETECTED Final   Candida albicans NOT DETECTED NOT DETECTED Final   Candida auris NOT DETECTED NOT DETECTED Final   Candida glabrata NOT DETECTED NOT DETECTED Final   Candida krusei NOT DETECTED NOT DETECTED Final   Candida parapsilosis NOT DETECTED NOT DETECTED Final   Candida tropicalis NOT DETECTED NOT DETECTED Final   Cryptococcus neoformans/gattii NOT DETECTED NOT DETECTED Final    Comment: Performed at [facility], [address], [place], [place] [address]  SARS CORONAVIRUS 2 (TAT 6-24 HRS) Nasopharyngeal Nasopharyngeal Swab     Status: None   Collection Time: 07/30/21 10:22 AM   Specimen: Nasopharyngeal Swab  Result Value Ref Range Status   SARS Coronavirus 2 NEGATIVE NEGATIVE Final    Comment: (NOTE) SARS-CoV-2 target nucleic acids are NOT DETECTED.  The SARS-CoV-2 RNA is generally detectable in upper and lower respiratory specimens during the acute phase of infection. Negative results do not preclude SARS-CoV-2 infection, do not rule  out co-infections with other pathogens, and should not be used as the sole basis for treatment or other patient management decisions. Negative results must be combined with clinical observations, patient history, and epidemiological information. The expected result is Negative.  Fact Sheet for Patients: HairSlick.no  Fact Sheet for Healthcare Providers: quierodirigir.com  This test is not yet approved or cleared by the [place] FDA and  has been authorized for detection and/or diagnosis of SARS-CoV-2 by FDA under an Emergency Use Authorization (EUA). This EUA will remain  in effect (meaning this test can be used) for the duration of the COVID-19 declaration under Se ction 564(b)(1) of the Act, 21 U.S.C. section 360bbb-3(b)(1), unless the authorization is terminated or revoked sooner.  Performed at [facility], [address], [place], [place] [address]   MRSA Next Gen by PCR, Nasal     Status: None   Collection Time: 07/30/21 11:19 AM   Specimen: Nasal Mucosa; Nasal Swab  Result Value Ref Range Status   MRSA by PCR Next Gen NOT DETECTED NOT DETECTED Final    Comment: (NOTE) The GeneXpert MRSA Assay (FDA approved for NASAL specimens only), is one component of a comprehensive MRSA colonization surveillance program. It is not intended to diagnose MRSA infection nor to guide or monitor treatment for MRSA infections. Test performance is not FDA approved in patients less than 23 years old. Performed at [facility], [address], [place], [place] [address]   Urine Culture     Status: None   Collection Time: 07/30/21  2:50 PM   Specimen: In/Out Cath Urine  Result Value Ref Range Status   Specimen Description   Final    IN/OUT CATH URINE Performed at [facility], [address] [place], [place] [address]    Special Requests   Final    NONE Performed at [facility], [address], [place], [place] [address]    Culture   Final    NO GROWTH Performed at [facility], [address], [place], [place] [address]    Report Status 07/31/2021 FINAL  Final  C Difficile Quick Screen (NO PCR Reflex)     Status: None   Collection Time: 08/01/21  4:20 PM   Specimen: STOOL  Result Value Ref Range Status   C Diff antigen NEGATIVE NEGATIVE Final   C Diff toxin NEGATIVE NEGATIVE Final   C Diff interpretation No C. difficile detected.  Final    Comment: Performed at [facility], [address] [place], [place] [address]         Radiology Studies: No results found.      Scheduled Meds:  amLODipine  10 mg Oral Daily   cariprazine  6 mg Oral Daily   chlorhexidine  15 mL Mouth Rinse BID   Chlorhexidine Gluconate Cloth  6 each Topical Daily   cholestyramine light  4 g Oral 2 times per day   doxepin  50 mg Oral QHS   feeding supplement  237 mL Oral BID BM   folic acid  1 mg Oral Daily   gabapentin  300 mg Oral TID   heparin  5,000 Units Subcutaneous Q8H   mouth rinse  15 mL Mouth Rinse q12n4p   metoprolol tartrate  25 mg Oral BID   multivitamin with minerals  1 tablet Oral Daily   nutrition supplement (JUVEN)  1 packet Oral BID BM   pantoprazole  40 mg Oral Daily   tapentadol  200 mg Oral BID   thiamine  100 mg Oral Daily   vortioxetine HBr  20 mg Oral Daily   Continuous Infusions:  sodium chloride 10 mL/hr at 08/01/21 0600     LOS: 5 days    Time spent: 39 minutes spent on chart review, discussion with nursing staff, consultants, updating family and interview/physical exam; more than 50% of that time was spent in counseling and/or coordination of care.    Alvira Philips [place], DO [facility] Available via Epic secure chat 7am-7pm After these hours, please refer to coverage provider listed on [email or web address] 08/04/2021, 1:01 PM

## 2021-08-04 NOTE — TOC Initial Note (Signed)
Transition of Care [facility]) - Initial/Assessment Note    Patient Details  Name: Holly Roberts MRN: [id number] Date of Birth: 15-Mar-1966  [facility] CM/SW Contact:    Holly Rogue, LCSW Phone Number: 08/04/2021, 10:26 AM  Clinical Narrative:    Patient seen in follow up to PT recommendation of SNF/24 hour supervision.  Holly Roberts is reluctantly willing to consider SNF, stating she knows that a sedentary life style is a death wish, and she wants to be stronger and healthier for her kids.  She lives here in [place] in an apartment with her daughter, and is employed, working remotely for [employer].  I explained SNF bed search process, and insurance auth. process.    Bed search initiated.  TOC will continue to follow during the course of hospitalization.            Expected Discharge Plan: Skilled Nursing Facility Barriers to Discharge: SNF Pending bed offer   Patient Goals and CMS Choice Patient states their goals for this hospitalization and ongoing recovery are:: "I need to do this for my kids."   Choice offered to / list presented to : Patient  Expected Discharge Plan and Services Expected Discharge Plan: Skilled Nursing Facility   Discharge Planning Services: CM Consult Post Acute Care Choice: Skilled Nursing Facility Living arrangements for the past 2 months: Apartment                                      Prior Living Arrangements/Services Living arrangements for the past 2 months: Apartment Lives with:: Adult Children Patient language and need for interpreter reviewed:: Yes        Need for Family Participation in Patient Care: Yes (Comment) Care giver support system in place?: Yes (comment)   Criminal Activity/Legal Involvement Pertinent to Current Situation/Hospitalization: No - Comment as needed  Activities of Daily Living Home Assistive Devices/Equipment: Eyeglasses, Holly Roberts (specify type) (4 wheeled walker) ADL Screening (condition at time of  admission) Patient's cognitive ability adequate to safely complete daily activities?: Yes Is the patient deaf or have difficulty hearing?: No Does the patient have difficulty seeing, even when wearing glasses/contacts?: No Does the patient have difficulty concentrating, remembering, or making decisions?: No Patient able to express need for assistance with ADLs?: Yes Does the patient have difficulty dressing or bathing?: No Independently performs ADLs?: Yes (appropriate for developmental age) Does the patient have difficulty walking or climbing stairs?: Yes Weakness of Legs: Both Weakness of Arms/Hands: None  Permission Sought/Granted   Permission granted to share information with : Yes, Verbal Permission Granted  Share Information with NAME: Holly Roberts (Daughter)   [phone]           Emotional Assessment Appearance:: Appears stated age Attitude/Demeanor/Rapport: Engaged Affect (typically observed): Appropriate Orientation: : Oriented to Self, Oriented to Place, Oriented to  Time, Oriented to Situation Alcohol / Substance Use: Not Applicable Psych Involvement: No (comment)  Admission diagnosis:  Metabolic acidosis [E87.2] Acute encephalopathy [G93.40] Patient Active Problem List   Diagnosis Date Noted   Pressure injury of skin 07/31/2021   Acute encephalopathy 07/30/2021   AKI (acute kidney injury) (HCC) 06/09/2021   Hypoglycemia without diagnosis of diabetes mellitus 06/09/2021   Elevated lipase 06/09/2021   Bipolar disorder (HCC) 06/09/2021   Depression 06/09/2021   Anxiety 06/09/2021   Chronic pain 06/09/2021   Metabolic acidosis 06/09/2021   Vomiting and diarrhea 06/09/2021   Cholecystitis  with cholelithiasis 04/19/2021   PCP:  Pcp, No Pharmacy:   [id number] - [place], [place] - [address] AT [facility] [address] [place] [address] Phone: [phone] Fax: [phone]     Social Determinants of  Health (SDOH) Interventions    Readmission Risk Interventions No flowsheet data found.

## 2021-08-05 LAB — CULTURE, BLOOD (SINGLE)

## 2021-08-05 LAB — GLUCOSE, CAPILLARY
Glucose-Capillary: 111 mg/dL — ABNORMAL HIGH (ref 70–99)
Glucose-Capillary: 123 mg/dL — ABNORMAL HIGH (ref 70–99)

## 2021-08-05 NOTE — Progress Notes (Signed)
PROGRESS NOTE    Holly Roberts  JXB:[id number] DOB: [birth year] DOA: 07/29/2021 PCP: Pcp, No    Brief Narrative:  Holly Roberts is a 55 year old female with past medical history significant for bipolar disorder, anxiety/depression, chronic pain syndrome, morbid obesity who presented to [facility] ED on 8/13 with nausea, decreased oral intake, confusion.  Daughter reports poor oral intake to include liquids/solids following laparoscopic cholecystectomy.  Originally admitted on 04/20/2021 for cold cholecystitis s/p laparoscopic cholecystectomy on 5/6 with postoperative course complicated by postcholecystectomy diarrhea in which she was started on cholestyramine.  Patient was readmitted on 6/24 2022 with 4-day history of vomiting/diarrhea and poor oral intake associated with a lipase of 263, AKI and UTI with high anion gap metabolic acidosis, hypoglycemia, hyperbilirubinemia; which illness resolved with IV fluid therapy and subsequent discharge on 06/10/2021.  Patient then represented to the ED on 07/24/2021 with emesis, abdominal pain, diarrhea with anion gap elevated to 16 and symptoms resolved with IV fluids and was subsequently discharged.  In the ED, patient was noted to be tachycardic, tachycardia hypnic, afebrile with normal SPO2.  Severe anion gap metabolic acidosis of 28 with albumin 5.5, slightly elevated ammonia level but negative salicylate.  Normal lactic acid, normal Tylenol level and normal kidney function.  EDP started omeprazole after discussing with poison control and she received 3 A of sodium bicarbonate, given 1 dose of empiric antibiotics and 1 L of IV fluid.  Patient was initially admitted to the [facility] service for severe anion gap metabolic acidosis with associated nausea and vomiting likely secondary to starvation ketosis.  Patient transferred to [facility] on 08/02/2021.   Assessment & Plan:   Active Problems:   Metabolic acidosis   Acute encephalopathy   Pressure injury of skin   Acute toxic  metabolic encephalopathy; POA: Improving Etiology likely secondary to her underlying starvation ketosis, noncompliance with her home medications.  CT head without contrast on admission with chronic right PCA infarct, otherwise no acute intracranial abnormality.  CTA chest negative for PE, no pneumonia or pulmonary infarction and otherwise unrevealing.  CT abdomen/pelvis with mild acute pancreatitis, otherwise unrevealing.  Continue treatment as below.  Severe anion gap metabolic acidosis 2/2 starvation ketosis: Resolved Nausea/vomiting Patient presenting with confusion, found to have elevated anion gap with associated nausea and vomiting in the setting of poor oral intake.  Onset of symptoms related to recent cholecystectomy on 04/21/2021.  Multiple ED presentations/hospitalizations for similar events following surgical procedure.  Also question if she is compliant with her home medications. --Anion Gap 28>>17>14>7 --Currently tolerating advance diet --Zofran as needed for nausea --Continue to monitor BMP, magnesium, phosphorus daily  Acute renal failure: Resolved Creatinine elevated 1.26 on arrival.  Likely secondary to prerenal azotemia from dehydration.  Started on IV fluids on arrival with resolution. --Cr 1.26>>0.64>0.71>0.63  Refeeding syndrome Patient with poor oral intake since her cholecystectomy on 04/21/2021.  Multiple hospitalizations with nausea/vomiting with family reports of poor oral intake.  Patient was noted to have low potassium, magnesium and phosphorus which have been supplemented. --Encourage increase oral intake; currently tolerating --Dietitian following, appreciate assistance  Bipolar disorder Anxiety/depression --Vraylar 6mg  PO daily --Trintellix 20mg  PO daily --Resumed home doxepin at reduced dose 50 mg p.o. nightly  Diarrhea C. difficile negative.  Etiology likely secondary to malabsorption. --Started cholestyramine BID --Imodium as needed  GERD: Protonix 40 mg  p.o. daily  Essential hypertension BP 121/82 this morning --Amlodipine 10 mg p.o. daily --Metoprolol tartrate 50 mg p.o. twice daily --Labetalol/hydralazine as needed --Continue monitor BP  closely  Sinus tachycardia Patient develops elevated heart rate while mobilizing, likely secondary to severe deconditioning versus orthostasis. --Metoprolol tartrate to 50 mg p.o. BID  Sacral ulcer, stage II; POA Pressure Injury 07/30/21 Sacrum Mid Stage 2 -  Partial thickness loss of dermis presenting as a shallow open injury with a red, pink wound bed without slough. redness (nonblanchable), open area of skin (Active)  07/30/21 1110  Location: Sacrum  Location Orientation: Mid  Staging: Stage 2 -  Partial thickness loss of dermis presenting as a shallow open injury with a red, pink wound bed without slough.  Wound Description (Comments): redness (nonblanchable), open area of skin  Present on Admission: Yes  --Continue local wound care, offloading   DVT prophylaxis: heparin injection 5,000 Units Start: 07/30/21 1400   Code Status: Full Code Family Communication: No family present at bedside this morning Disposition Plan:  Level of care: Telemetry Status is: Inpatient  Remains inpatient appropriate because:Persistent severe electrolyte disturbances, Altered mental status, Unsafe d/c plan, IV treatments appropriate due to intensity of illness or inability to take PO, and Inpatient level of care appropriate due to severity of illness  Dispo: The patient is from: Home              Anticipated d/c is to: SNF              Patient currently is medically stable to d/c.   Difficult to place patient No  Consultants:  PCCM - signed off 8/17  Procedures:  None  Antimicrobials:  Ceftriaxone 8/14 - 8/14 Metronidazole 8/14 - 8/14   Subjective: Patient seen examined at bedside, resting comfortably.  Sleeping but easily arousable.  No specific complaints this morning.  No family present at  bedside.  Awaiting SNF placement. No other questions or concerns at this time.  Denies headache, no chest pain, no palpitations, no shortness of breath, no abdominal pain.  No acute events overnight per nursing staff.  Objective: Vitals:   08/04/21 2047 08/05/21 0457 08/05/21 0458 08/05/21 1328  BP: (!) 148/103 121/82  118/83  Pulse: 85 79  78  Resp: 20 20    Temp: 98.6 F (37 C) 97.8 F (36.6 C)  98.3 F (36.8 C)  TempSrc:  Oral    SpO2: 98% 97%  95%  Weight:   120.1 kg   Height:        Intake/Output Summary (Last 24 hours) at 08/05/2021 1439 Last data filed at 08/05/2021 0900 Gross per 24 hour  Intake 240 ml  Output --  Net 240 ml   Filed Weights   08/03/21 0503 08/04/21 0451 08/05/21 0458  Weight: 118.2 kg 119.1 kg 120.1 kg    Examination:  General exam: Appears calm and comfortable Respiratory system: Clear to auscultation. Respiratory effort normal.  On room air Cardiovascular system: S1 & S2 heard, RRR. No JVD, murmurs, rubs, gallops or clicks. No pedal edema. Gastrointestinal system: Abdomen is nondistended, soft and nontender. No organomegaly or masses felt. Normal bowel sounds heard. Central nervous system: Alert and oriented to person/place/time; No focal neurological deficits. Extremities: Moves all extremities independently Skin: Sacral ulcer noted, otherwise no rashes, lesions or ulcers Psychiatry: Judgement and insight appear poor. Mood & affect appropriate.     Data Reviewed: I have personally reviewed following labs and imaging studies  CBC: Recent Labs  Lab 07/30/21 0226 07/30/21 0843 07/30/21 1705 07/30/21 1717 07/31/21 0312 08/01/21 0246  WBC 20.1* 21.0*  --  18.7* 13.4* 7.3  NEUTROABS 18.0*  --   --  16.8*  --   --   HGB 14.8 13.2  --  12.5 12.3 12.0  HCT 46.1* 41.9  --  40.5 38.2 34.8*  MCV 94.9 94.6  --  94.4 93.6 86.1  PLT 374 302 267 253 214 204   Basic Metabolic Panel: Recent Labs  Lab 07/31/21 0312 07/31/21 1915 08/01/21 0246  08/02/21 0315 08/03/21 0753 08/04/21 0525  NA 145 144 143 141 145 141  K 3.1* 2.9* 3.2* 3.1* 3.2* 3.6  CL 114* 105 102 99 101 104  CO2 15* 21* 24 25 30 30   GLUCOSE 95 102* 104* 96 89 88  BUN 9 6 <5* <5* 7 6  CREATININE 1.01* 0.85 0.67 0.64 0.71 0.63  CALCIUM 9.4 9.5 8.9 9.2 9.8 8.8*  MG 2.0  --  1.6* 2.3 2.4 2.1  PHOS <1.0*  --  1.9* 2.7 3.8 4.4   GFR: Estimated Creatinine Clearance: 101.5 mL/min (by C-G formula based on SCr of 0.63 mg/dL). Liver Function Tests: Recent Labs  Lab 07/30/21 0226 07/30/21 0843  AST 23 19  ALT 27 22  ALKPHOS 141* 128*  BILITOT 1.8* 2.0*  PROT 9.1* 8.0  ALBUMIN 5.5* 4.9   Recent Labs  Lab 07/30/21 0843  LIPASE 322*  AMYLASE 455*   Recent Labs  Lab 07/30/21 0240 07/30/21 0842  AMMONIA 109* 70*   Coagulation Profile: Recent Labs  Lab 07/30/21 0226 07/30/21 0843 07/30/21 1705  INR 1.3* 1.3* 1.3*   Cardiac Enzymes: Recent Labs  Lab 07/30/21 0843  CKTOTAL 141  CKMB 11.3*   BNP (last 3 results) No results for input(s): PROBNP in the last 8760 hours. HbA1C: No results for input(s): HGBA1C in the last 72 hours. CBG: Recent Labs  Lab 07/30/21 2111 07/31/21 1438 08/04/21 0732 08/04/21 1142 08/05/21 1121  GLUCAP 107* 95 88 115* 111*   Lipid Profile: No results for input(s): CHOL, HDL, LDLCALC, TRIG, CHOLHDL, LDLDIRECT in the last 72 hours.  Thyroid Function Tests: No results for input(s): TSH, T4TOTAL, FREET4, T3FREE, THYROIDAB in the last 72 hours. Anemia Panel: No results for input(s): VITAMINB12, FOLATE, FERRITIN, TIBC, IRON, RETICCTPCT in the last 72 hours. Sepsis Labs: Recent Labs  Lab 07/30/21 0226 07/30/21 0842  LATICACIDVEN 1.5 0.8    Recent Results (from the past 240 hour(s))  Blood culture (routine single)     Status: Abnormal   Collection Time: 07/30/21  2:40 AM   Specimen: BLOOD  Result Value Ref Range Status   Specimen Description   Final    BLOOD RIGHT ANTECUBITAL Performed at [facility], [address], [place], [place] [address]    Special Requests   Final    BOTTLES DRAWN AEROBIC AND ANAEROBIC Blood Culture results may not be optimal due to an inadequate volume of blood received in culture bottles Performed at [facility], [address], [place], [place] [address]    Culture  Setup Time   Final    GRAM POSITIVE COCCI IN CLUSTERS AEROBIC BOTTLE ONLY Organism ID to follow CRITICAL RESULT CALLED TO, READ BACK BY AND VERIFIED WITH: Sophronia Simas PHARMD 1900 08/03/21 A BROWNING    Culture (A)  Final    MICROCOCCUS LUTEUS/LYLAE THE SIGNIFICANCE OF ISOLATING THIS ORGANISM FROM A SINGLE SET OF BLOOD CULTURES WHEN MULTIPLE SETS ARE DRAWN IS UNCERTAIN. PLEASE NOTIFY THE MICROBIOLOGY DEPARTMENT WITHIN ONE WEEK IF SPECIATION AND SENSITIVITIES ARE REQUIRED. Performed at [facility], [address], [place], [place] [address]  Report Status 08/05/2021 FINAL  Final  Blood Culture ID Panel (Reflexed)     Status: None   Collection Time: 07/30/21  2:40 AM  Result Value Ref Range Status   Enterococcus faecalis NOT DETECTED NOT DETECTED Final   Enterococcus Faecium NOT DETECTED NOT DETECTED Final   Listeria monocytogenes NOT DETECTED NOT DETECTED Final   Staphylococcus species NOT DETECTED NOT DETECTED Final   Staphylococcus aureus (BCID) NOT DETECTED NOT DETECTED Final   Staphylococcus epidermidis NOT DETECTED NOT DETECTED Final   Staphylococcus lugdunensis NOT DETECTED NOT DETECTED Final   Streptococcus species NOT DETECTED NOT DETECTED Final   Streptococcus agalactiae NOT DETECTED NOT DETECTED Final   Streptococcus pneumoniae NOT DETECTED NOT DETECTED Final   Streptococcus pyogenes NOT DETECTED NOT DETECTED Final   A.calcoaceticus-baumannii NOT DETECTED NOT DETECTED Final   Bacteroides fragilis NOT DETECTED NOT DETECTED Final   Enterobacterales NOT DETECTED NOT DETECTED Final   Enterobacter cloacae complex NOT DETECTED NOT DETECTED Final    Escherichia coli NOT DETECTED NOT DETECTED Final   Klebsiella aerogenes NOT DETECTED NOT DETECTED Final   Klebsiella oxytoca NOT DETECTED NOT DETECTED Final   Klebsiella pneumoniae NOT DETECTED NOT DETECTED Final   Proteus species NOT DETECTED NOT DETECTED Final   Salmonella species NOT DETECTED NOT DETECTED Final   Serratia marcescens NOT DETECTED NOT DETECTED Final   Haemophilus influenzae NOT DETECTED NOT DETECTED Final   Neisseria meningitidis NOT DETECTED NOT DETECTED Final   Pseudomonas aeruginosa NOT DETECTED NOT DETECTED Final   Stenotrophomonas maltophilia NOT DETECTED NOT DETECTED Final   Candida albicans NOT DETECTED NOT DETECTED Final   Candida auris NOT DETECTED NOT DETECTED Final   Candida glabrata NOT DETECTED NOT DETECTED Final   Candida krusei NOT DETECTED NOT DETECTED Final   Candida parapsilosis NOT DETECTED NOT DETECTED Final   Candida tropicalis NOT DETECTED NOT DETECTED Final   Cryptococcus neoformans/gattii NOT DETECTED NOT DETECTED Final    Comment: Performed at [facility], [address], [place], [place] [address]  SARS CORONAVIRUS 2 (TAT 6-24 HRS) Nasopharyngeal Nasopharyngeal Swab     Status: None   Collection Time: 07/30/21 10:22 AM   Specimen: Nasopharyngeal Swab  Result Value Ref Range Status   SARS Coronavirus 2 NEGATIVE NEGATIVE Final    Comment: (NOTE) SARS-CoV-2 target nucleic acids are NOT DETECTED.  The SARS-CoV-2 RNA is generally detectable in upper and lower respiratory specimens during the acute phase of infection. Negative results do not preclude SARS-CoV-2 infection, do not rule out co-infections with other pathogens, and should not be used as the sole basis for treatment or other patient management decisions. Negative results must be combined with clinical observations, patient history, and epidemiological information. The expected result is Negative.  Fact Sheet for Patients: HairSlick.no  Fact  Sheet for Healthcare Providers: quierodirigir.com  This test is not yet approved or cleared by the [place] FDA and  has been authorized for detection and/or diagnosis of SARS-CoV-2 by FDA under an Emergency Use Authorization (EUA). This EUA will remain  in effect (meaning this test can be used) for the duration of the COVID-19 declaration under Se ction 564(b)(1) of the Act, 21 U.S.C. section 360bbb-3(b)(1), unless the authorization is terminated or revoked sooner.  Performed at [facility], [address], [place], [place] [address]   MRSA Next Gen by PCR, Nasal     Status: None   Collection Time: 07/30/21 11:19 AM   Specimen: Nasal Mucosa; Nasal Swab  Result Value  Ref Range Status   MRSA by PCR Next Gen NOT DETECTED NOT DETECTED Final    Comment: (NOTE) The GeneXpert MRSA Assay (FDA approved for NASAL specimens only), is one component of a comprehensive MRSA colonization surveillance program. It is not intended to diagnose MRSA infection nor to guide or monitor treatment for MRSA infections. Test performance is not FDA approved in patients less than 8 years old. Performed at [facility], [address] [place], [place] [address]   Urine Culture     Status: None   Collection Time: 07/30/21  2:50 PM   Specimen: In/Out Cath Urine  Result Value Ref Range Status   Specimen Description   Final    IN/OUT CATH URINE Performed at [facility], [address] [place], [place] [address]    Special Requests   Final    NONE Performed at [facility], [address], [place], [place] [address]    Culture   Final    NO GROWTH Performed at [facility], [address], [place], [place] [address]    Report Status 07/31/2021 FINAL  Final  C Difficile Quick Screen (NO PCR Reflex)     Status: None   Collection Time: 08/01/21  4:20 PM   Specimen: STOOL  Result Value Ref Range Status    C Diff antigen NEGATIVE NEGATIVE Final   C Diff toxin NEGATIVE NEGATIVE Final   C Diff interpretation No C. difficile detected.  Final    Comment: Performed at [facility], [address], [place], [place] [address]         Radiology Studies: No results found.      Scheduled Meds:  amLODipine  10 mg Oral Daily   cariprazine  6 mg Oral Daily   chlorhexidine  15 mL Mouth Rinse BID   Chlorhexidine Gluconate Cloth  6 each Topical Daily   cholestyramine light  4 g Oral 2 times per day   doxepin  50 mg Oral QHS   feeding supplement  237 mL Oral BID BM   folic acid  1 mg Oral Daily   gabapentin  300 mg Oral TID   heparin  5,000 Units Subcutaneous Q8H   mouth rinse  15 mL Mouth Rinse q12n4p   metoprolol tartrate  50 mg Oral BID   multivitamin with minerals  1 tablet Oral Daily   nutrition supplement (JUVEN)  1 packet Oral BID BM   pantoprazole  40 mg Oral Daily   tapentadol  200 mg Oral TID   thiamine  100 mg Oral Daily   vortioxetine HBr  20 mg Oral Daily   Continuous Infusions:  sodium chloride 10 mL/hr at 08/01/21 0600     LOS: 6 days    Time spent: 37 minutes spent on chart review, discussion with nursing staff, consultants, updating family and interview/physical exam; more than 50% of that time was spent in counseling and/or coordination of care.    Alvira Philips [place], DO [facility] Available via Epic secure chat 7am-7pm After these hours, please refer to coverage provider listed on [email or web address] 08/05/2021, 2:39 PM

## 2021-08-06 MED ORDER — LORAZEPAM 0.5 MG PO TABS
0.5000 mg | ORAL_TABLET | ORAL | Status: DC | PRN
  Administered 2021-08-06 – 2021-08-10 (×13): 0.5 mg via ORAL
  Filled 2021-08-06 (×13): qty 1

## 2021-08-06 NOTE — Progress Notes (Signed)
PROGRESS NOTE    Holly Roberts  ONG:[id number] DOB: 1966-11-16 DOA: 07/29/2021 PCP: Pcp, No    Brief Narrative:  Holly Roberts is a 55 year old female with past medical history significant for bipolar disorder, anxiety/depression, chronic pain syndrome, morbid obesity who presented to [facility] ED on 8/13 with nausea, decreased oral intake, confusion.  Daughter reports poor oral intake to include liquids/solids following laparoscopic cholecystectomy.  Originally admitted on 04/20/2021 for cold cholecystitis s/p laparoscopic cholecystectomy on 5/6 with postoperative course complicated by postcholecystectomy diarrhea in which she was started on cholestyramine.  Patient was readmitted on 6/24 2022 with 4-day history of vomiting/diarrhea and poor oral intake associated with a lipase of 263, AKI and UTI with high anion gap metabolic acidosis, hypoglycemia, hyperbilirubinemia; which illness resolved with IV fluid therapy and subsequent discharge on 06/10/2021.  Patient then represented to the ED on 07/24/2021 with emesis, abdominal pain, diarrhea with anion gap elevated to 16 and symptoms resolved with IV fluids and was subsequently discharged.  In the ED, patient was noted to be tachycardic, tachycardia hypnic, afebrile with normal SPO2.  Severe anion gap metabolic acidosis of 28 with albumin 5.5, slightly elevated ammonia level but negative salicylate.  Normal lactic acid, normal Tylenol level and normal kidney function.  EDP started omeprazole after discussing with poison control and she received 3 A of sodium bicarbonate, given 1 dose of empiric antibiotics and 1 L of IV fluid.  Patient was initially admitted to the [facility] service for severe anion gap metabolic acidosis with associated nausea and vomiting likely secondary to starvation ketosis.  Patient transferred to [facility] on 08/02/2021.   Assessment & Plan:   Active Problems:   Metabolic acidosis   Acute encephalopathy   Pressure injury of skin   Acute toxic  metabolic encephalopathy; POA: Improving Etiology likely secondary to her underlying starvation ketosis, noncompliance with her home medications.  CT head without contrast on admission with chronic right PCA infarct, otherwise no acute intracranial abnormality.  CTA chest negative for PE, no pneumonia or pulmonary infarction and otherwise unrevealing.  CT abdomen/pelvis with mild acute pancreatitis, otherwise unrevealing.  Continue treatment as below.  Severe anion gap metabolic acidosis 2/2 starvation ketosis: Resolved Nausea/vomiting Patient presenting with confusion, found to have elevated anion gap with associated nausea and vomiting in the setting of poor oral intake.  Onset of symptoms related to recent cholecystectomy on 04/21/2021.  Multiple ED presentations/hospitalizations for similar events following surgical procedure.  Also question if she is compliant with her home medications. --Anion Gap 28>>17>14>7 --Currently tolerating advance diet --Zofran as needed for nausea --Continue to monitor BMP, magnesium, phosphorus daily  Acute renal failure: Resolved Creatinine elevated 1.26 on arrival.  Likely secondary to prerenal azotemia from dehydration.  Started on IV fluids on arrival with resolution. --Cr 1.26>>0.64>0.71>0.63  Refeeding syndrome Patient with poor oral intake since her cholecystectomy on 04/21/2021.  Multiple hospitalizations with nausea/vomiting with family reports of poor oral intake.  Patient was noted to have low potassium, magnesium and phosphorus which have been supplemented. --Encourage increase oral intake; currently tolerating --Dietitian following, appreciate assistance  Bipolar disorder Anxiety/depression --Vraylar 6mg  PO daily --Trintellix 20mg  PO daily --Resumed home doxepin at reduced dose 50 mg p.o. nightly  Diarrhea C. difficile negative.  Etiology likely secondary to malabsorption. --Started cholestyramine BID --Imodium as needed  GERD: Protonix 40 mg  p.o. daily  Essential hypertension BP 111/83 this morning --Amlodipine 10 mg p.o. daily --Metoprolol tartrate 50 mg p.o. twice daily --Labetalol/hydralazine as needed --Continue monitor BP  closely  Sinus tachycardia Patient develops elevated heart rate while mobilizing, likely secondary to severe deconditioning versus orthostasis. --Metoprolol tartrate to 50 mg p.o. BID  Sacral ulcer, stage II; POA Pressure Injury 07/30/21 Sacrum Mid Stage 2 -  Partial thickness loss of dermis presenting as a shallow open injury with a red, pink wound bed without slough. redness (nonblanchable), open area of skin (Active)  07/30/21 1110  Location: Sacrum  Location Orientation: Mid  Staging: Stage 2 -  Partial thickness loss of dermis presenting as a shallow open injury with a red, pink wound bed without slough.  Wound Description (Comments): redness (nonblanchable), open area of skin  Present on Admission: Yes  --Continue local wound care, offloading   DVT prophylaxis: heparin injection 5,000 Units Start: 07/30/21 1400   Code Status: Full Code Family Communication: No family present at bedside this morning Disposition Plan:  Level of care: Telemetry Status is: Inpatient  Remains inpatient appropriate because:Persistent severe electrolyte disturbances, Altered mental status, Unsafe d/c plan, IV treatments appropriate due to intensity of illness or inability to take PO, and Inpatient level of care appropriate due to severity of illness  Dispo: The patient is from: Home              Anticipated d/c is to: SNF              Patient currently is medically stable to d/c.   Difficult to place patient No  Consultants:  PCCM - signed off 8/17  Procedures:  None  Antimicrobials:  Ceftriaxone 8/14 - 8/14 Metronidazole 8/14 - 8/14   Subjective: Patient seen examined at bedside, resting comfortably.  Watching TV.  No complaints this morning.  No family present at bedside, but anticipating her  daughter to visit her later this afternoon.  Awaiting SNF placement. No other questions or concerns at this time.  Denies headache, no chest pain, no palpitations, no shortness of breath, no abdominal pain.  No acute events overnight per nursing staff.  Objective: Vitals:   08/05/21 1328 08/05/21 1921 08/06/21 0441 08/06/21 0444  BP: 118/83 123/85 111/83   Pulse: 78 83 81   Resp:  20 20   Temp: 98.3 F (36.8 C) 98.4 F (36.9 C) 98.3 F (36.8 C)   TempSrc:  Oral Oral   SpO2: 95% 97% 96%   Weight:    118.6 kg  Height:        Intake/Output Summary (Last 24 hours) at 08/06/2021 1114 Last data filed at 08/06/2021 0900 Gross per 24 hour  Intake 580 ml  Output --  Net 580 ml   Filed Weights   08/04/21 0451 08/05/21 0458 08/06/21 0444  Weight: 119.1 kg 120.1 kg 118.6 kg    Examination:  General exam: Appears calm and comfortable Respiratory system: Clear to auscultation. Respiratory effort normal.  On room air Cardiovascular system: S1 & S2 heard, RRR. No JVD, murmurs, rubs, gallops or clicks. No pedal edema. Gastrointestinal system: Abdomen is nondistended, soft and nontender. No organomegaly or masses felt. Normal bowel sounds heard. Central nervous system: Alert and oriented to person/place/time; No focal neurological deficits. Extremities: Moves all extremities independently Skin: Sacral ulcer noted, otherwise no rashes, lesions or ulcers Psychiatry: Judgement and insight appear poor. Mood & affect appropriate.     Data Reviewed: I have personally reviewed following labs and imaging studies  CBC: Recent Labs  Lab 07/30/21 1705 07/30/21 1717 07/31/21 0312 08/01/21 0246  WBC  --  18.7* 13.4* 7.3  NEUTROABS  --  16.8*  --   --   HGB  --  12.5 12.3 12.0  HCT  --  40.5 38.2 34.8*  MCV  --  94.4 93.6 86.1  PLT 267 253 214 204   Basic Metabolic Panel: Recent Labs  Lab 07/31/21 0312 07/31/21 1915 08/01/21 0246 08/02/21 0315 08/03/21 0753 08/04/21 0525  NA 145 144  143 141 145 141  K 3.1* 2.9* 3.2* 3.1* 3.2* 3.6  CL 114* 105 102 99 101 104  CO2 15* 21* 24 25 30 30   GLUCOSE 95 102* 104* 96 89 88  BUN 9 6 <5* <5* 7 6  CREATININE 1.01* 0.85 0.67 0.64 0.71 0.63  CALCIUM 9.4 9.5 8.9 9.2 9.8 8.8*  MG 2.0  --  1.6* 2.3 2.4 2.1  PHOS <1.0*  --  1.9* 2.7 3.8 4.4   GFR: Estimated Creatinine Clearance: 100.7 mL/min (by C-G formula based on SCr of 0.63 mg/dL). Liver Function Tests: No results for input(s): AST, ALT, ALKPHOS, BILITOT, PROT, ALBUMIN in the last 168 hours.  No results for input(s): LIPASE, AMYLASE in the last 168 hours.  No results for input(s): AMMONIA in the last 168 hours.  Coagulation Profile: Recent Labs  Lab 07/30/21 1705  INR 1.3*   Cardiac Enzymes: No results for input(s): CKTOTAL, CKMB, CKMBINDEX, TROPONINI in the last 168 hours.  BNP (last 3 results) No results for input(s): PROBNP in the last 8760 hours. HbA1C: No results for input(s): HGBA1C in the last 72 hours. CBG: Recent Labs  Lab 07/31/21 1438 08/04/21 0732 08/04/21 1142 08/05/21 1121 08/05/21 1628  GLUCAP 95 88 115* 111* 123*   Lipid Profile: No results for input(s): CHOL, HDL, LDLCALC, TRIG, CHOLHDL, LDLDIRECT in the last 72 hours.  Thyroid Function Tests: No results for input(s): TSH, T4TOTAL, FREET4, T3FREE, THYROIDAB in the last 72 hours. Anemia Panel: No results for input(s): VITAMINB12, FOLATE, FERRITIN, TIBC, IRON, RETICCTPCT in the last 72 hours. Sepsis Labs: No results for input(s): PROCALCITON, LATICACIDVEN in the last 168 hours.   Recent Results (from the past 240 hour(s))  Blood culture (routine single)     Status: Abnormal   Collection Time: 07/30/21  2:40 AM   Specimen: BLOOD  Result Value Ref Range Status   Specimen Description   Final    BLOOD RIGHT ANTECUBITAL Performed at [facility], [address] [place], [place] [address]    Special Requests   Final    BOTTLES DRAWN AEROBIC AND ANAEROBIC Blood Culture  results may not be optimal due to an inadequate volume of blood received in culture bottles Performed at [facility], [address] [place], [place] [address]    Culture  Setup Time   Final    GRAM POSITIVE COCCI IN CLUSTERS AEROBIC BOTTLE ONLY Organism ID to follow CRITICAL RESULT CALLED TO, READ BACK BY AND VERIFIED WITH: Sophronia Simas PHARMD 1900 08/03/21 A BROWNING    Culture (A)  Final    MICROCOCCUS LUTEUS/LYLAE THE SIGNIFICANCE OF ISOLATING THIS ORGANISM FROM A SINGLE SET OF BLOOD CULTURES WHEN MULTIPLE SETS ARE DRAWN IS UNCERTAIN. PLEASE NOTIFY THE MICROBIOLOGY DEPARTMENT WITHIN ONE WEEK IF SPECIATION AND SENSITIVITIES ARE REQUIRED. Performed at [facility], [address], [place], [place] [address]    Report Status 08/05/2021 FINAL  Final  Blood Culture ID Panel (Reflexed)     Status: None   Collection Time: 07/30/21  2:40 AM  Result Value Ref Range Status   Enterococcus faecalis NOT DETECTED NOT  DETECTED Final   Enterococcus Faecium NOT DETECTED NOT DETECTED Final   Listeria monocytogenes NOT DETECTED NOT DETECTED Final   Staphylococcus species NOT DETECTED NOT DETECTED Final   Staphylococcus aureus (BCID) NOT DETECTED NOT DETECTED Final   Staphylococcus epidermidis NOT DETECTED NOT DETECTED Final   Staphylococcus lugdunensis NOT DETECTED NOT DETECTED Final   Streptococcus species NOT DETECTED NOT DETECTED Final   Streptococcus agalactiae NOT DETECTED NOT DETECTED Final   Streptococcus pneumoniae NOT DETECTED NOT DETECTED Final   Streptococcus pyogenes NOT DETECTED NOT DETECTED Final   A.calcoaceticus-baumannii NOT DETECTED NOT DETECTED Final   Bacteroides fragilis NOT DETECTED NOT DETECTED Final   Enterobacterales NOT DETECTED NOT DETECTED Final   Enterobacter cloacae complex NOT DETECTED NOT DETECTED Final   Escherichia coli NOT DETECTED NOT DETECTED Final   Klebsiella aerogenes NOT DETECTED NOT DETECTED Final   Klebsiella oxytoca NOT DETECTED NOT  DETECTED Final   Klebsiella pneumoniae NOT DETECTED NOT DETECTED Final   Proteus species NOT DETECTED NOT DETECTED Final   Salmonella species NOT DETECTED NOT DETECTED Final   Serratia marcescens NOT DETECTED NOT DETECTED Final   Haemophilus influenzae NOT DETECTED NOT DETECTED Final   Neisseria meningitidis NOT DETECTED NOT DETECTED Final   Pseudomonas aeruginosa NOT DETECTED NOT DETECTED Final   Stenotrophomonas maltophilia NOT DETECTED NOT DETECTED Final   Candida albicans NOT DETECTED NOT DETECTED Final   Candida auris NOT DETECTED NOT DETECTED Final   Candida glabrata NOT DETECTED NOT DETECTED Final   Candida krusei NOT DETECTED NOT DETECTED Final   Candida parapsilosis NOT DETECTED NOT DETECTED Final   Candida tropicalis NOT DETECTED NOT DETECTED Final   Cryptococcus neoformans/gattii NOT DETECTED NOT DETECTED Final    Comment: Performed at [facility], [address], [place], [place] [address]  SARS CORONAVIRUS 2 (TAT 6-24 HRS) Nasopharyngeal Nasopharyngeal Swab     Status: None   Collection Time: 07/30/21 10:22 AM   Specimen: Nasopharyngeal Swab  Result Value Ref Range Status   SARS Coronavirus 2 NEGATIVE NEGATIVE Final    Comment: (NOTE) SARS-CoV-2 target nucleic acids are NOT DETECTED.  The SARS-CoV-2 RNA is generally detectable in upper and lower respiratory specimens during the acute phase of infection. Negative results do not preclude SARS-CoV-2 infection, do not rule out co-infections with other pathogens, and should not be used as the sole basis for treatment or other patient management decisions. Negative results must be combined with clinical observations, patient history, and epidemiological information. The expected result is Negative.  Fact Sheet for Patients: HairSlick.no  Fact Sheet for Healthcare Providers: quierodirigir.com  This test is not yet approved or cleared by the [place] FDA and   has been authorized for detection and/or diagnosis of SARS-CoV-2 by FDA under an Emergency Use Authorization (EUA). This EUA will remain  in effect (meaning this test can be used) for the duration of the COVID-19 declaration under Se ction 564(b)(1) of the Act, 21 U.S.C. section 360bbb-3(b)(1), unless the authorization is terminated or revoked sooner.  Performed at [facility], [address] [place], [place] [address]   MRSA Next Gen by PCR, Nasal     Status: None   Collection Time: 07/30/21 11:19 AM   Specimen: Nasal Mucosa; Nasal Swab  Result Value Ref Range Status   MRSA by PCR Next Gen NOT DETECTED NOT DETECTED Final    Comment: (NOTE) The GeneXpert MRSA Assay (FDA approved for NASAL specimens only), is one component of a comprehensive MRSA colonization surveillance  program. It is not intended to diagnose MRSA infection nor to guide or monitor treatment for MRSA infections. Test performance is not FDA approved in patients less than 48 years old. Performed at [facility], [address], [place], [place] [address]   Urine Culture     Status: None   Collection Time: 07/30/21  2:50 PM   Specimen: In/Out Cath Urine  Result Value Ref Range Status   Specimen Description   Final    IN/OUT CATH URINE Performed at [facility], [address], [place], [place] [address]    Special Requests   Final    NONE Performed at [facility], [address] [place], [place] [address]    Culture   Final    NO GROWTH Performed at [facility], [address], [place], [place] [address]    Report Status 07/31/2021 FINAL  Final  C Difficile Quick Screen (NO PCR Reflex)     Status: None   Collection Time: 08/01/21  4:20 PM   Specimen: STOOL  Result Value Ref Range Status   C Diff antigen NEGATIVE NEGATIVE Final   C Diff toxin NEGATIVE NEGATIVE Final   C Diff interpretation No C. difficile detected.  Final     Comment: Performed at [facility], [address] [place], [place] [address]         Radiology Studies: No results found.      Scheduled Meds:  amLODipine  10 mg Oral Daily   cariprazine  6 mg Oral Daily   chlorhexidine  15 mL Mouth Rinse BID   Chlorhexidine Gluconate Cloth  6 each Topical Daily   cholestyramine light  4 g Oral 2 times per day   doxepin  50 mg Oral QHS   feeding supplement  237 mL Oral BID BM   folic acid  1 mg Oral Daily   gabapentin  300 mg Oral TID   heparin  5,000 Units Subcutaneous Q8H   mouth rinse  15 mL Mouth Rinse q12n4p   metoprolol tartrate  50 mg Oral BID   multivitamin with minerals  1 tablet Oral Daily   nutrition supplement (JUVEN)  1 packet Oral BID BM   pantoprazole  40 mg Oral Daily   tapentadol  200 mg Oral TID   thiamine  100 mg Oral Daily   vortioxetine HBr  20 mg Oral Daily   Continuous Infusions:  sodium chloride 10 mL/hr at 08/01/21 0600     LOS: 7 days    Time spent: 37 minutes spent on chart review, discussion with nursing staff, consultants, updating family and interview/physical exam; more than 50% of that time was spent in counseling and/or coordination of care.    Alvira Philips [place], DO [facility] Available via Epic secure chat 7am-7pm After these hours, please refer to coverage provider listed on [email or web address] 08/06/2021, 11:14 AM

## 2021-08-07 MED ORDER — MELATONIN 3 MG PO TABS
3.0000 mg | ORAL_TABLET | Freq: Every day | ORAL | Status: DC
  Administered 2021-08-07 – 2021-08-09 (×3): 3 mg via ORAL
  Filled 2021-08-07 (×3): qty 1

## 2021-08-07 NOTE — TOC Progression Note (Signed)
Transition of Care [facility]) - Progression Note    Patient Details  Name: Holly Roberts MRN: [id number] Date of Birth: 10-02-1966  [facility]) CM/SW Contact  Darleene Cleaver, [place] Phone Number: 08/07/2021, 12:11 PM  Clinical Narrative:     CSW received a phone call from [facility], she will be here tomorrow after 10am.  CSW also sent updated PT notes to SNFs awaiting bed offers.  CSW to continue to follow patient's progress throughout discharge planning.  Expected Discharge Plan: Skilled Nursing Facility Barriers to Discharge: SNF Pending bed offer  Expected Discharge Plan and Services Expected Discharge Plan: Skilled Nursing Facility   Discharge Planning Services: CM Consult Post Acute Care Choice: Skilled Nursing Facility Living arrangements for the past 2 months: Apartment                                       Social Determinants of Health (SDOH) Interventions    Readmission Risk Interventions No flowsheet data found.

## 2021-08-07 NOTE — Progress Notes (Signed)
Physical Therapy Treatment Patient Details Name: Holly Roberts MRN: [id number] DOB: 08-Jun-1966 Today's Date: 08/07/2021    History of Present Illness Patient is a 55 year old female who presented to the hosptial on 8/13 with confusion, decreased oral intake, and nausea. patient was found to have acute toxic metabolic encephalopathy,Severe anion gap metabolic acidosis, acute renal failure, and refeeding syndrome. PMH: Pipolar,HTN, and GERD    PT Comments    Patient agreeable to therapy despite limited sleep last night. Start of session pt ambulated short distance to sink and completed self care/therapeutic activities in standing: brush teeth, was face. Patient fatigues easily and required seated rest break prior to gait. Pt's HR more stable with activity and reached max of 128 bpm with ~60' ambulation. She was instructed on functional LE strengthening with sit<>stands. Patient will benefit from skilled PT interventions to progress mobility as able.     Follow Up Recommendations  SNF (pt's daughter/pt discussed plan and pt is ready to pursue SNF for ST rehab)     Equipment Recommendations  Rolling walker with 5" wheels;3in1 (PT) (TBA, pt may progress and not require DME)    Recommendations for Other Services       Precautions / Restrictions Precautions Precautions: Fall Precaution Comments: monitor BP and HR Restrictions Weight Bearing Restrictions: No    Mobility  Bed Mobility Overal bed mobility: Needs Assistance Bed Mobility: Supine to Sit     Supine to sit: Min guard;Min assist     General bed mobility comments: pt taking extra time and effort, assist and cues for use of bed rail required    Transfers Overall transfer level: Needs assistance Equipment used: Rolling walker (2 wheeled) Transfers: Sit to/from Stand Sit to Stand: Min guard;From elevated surface         General transfer comment: pt using bil UE's for power up and reach back to control lowering. min  guard for safety.  Ambulation/Gait Ambulation/Gait assistance: Min assist;Min guard  60 (40, 20) Assistive device: Rolling walker (2 wheeled) Gait Pattern/deviations: Step-through pattern;Decreased stride length;Shuffle Gait velocity: decr   General Gait Details: cues to keep walker close for safe proximity, pt amb short distance to sink and then required seated rest. amb in hallway with intermittent min assist for walker and min guard for safety. no overt LOB noted. pt easily fatigued and required seated rest after amb ~40'.   Stairs             Wheelchair Mobility    Modified Rankin (Stroke Patients Only)       Balance Overall balance assessment: Needs assistance Sitting-balance support: No upper extremity supported Sitting balance-Leahy Scale: Good     Standing balance support: During functional activity Standing balance-Leahy Scale: Fair                              Cognition Arousal/Alertness: Awake/alert Behavior During Therapy: WFL for tasks assessed/performed Overall Cognitive Status: Within Functional Limits for tasks assessed                                        Exercises Other Exercises Other Exercises: 5x Sit<>Stand from recliner with bil UE use for power up.    General Comments        Pertinent Vitals/Pain Pain Assessment: Faces Faces Pain Scale: Hurts little more Pain Location: general back and knees  Pain Descriptors / Indicators: Aching Pain Intervention(s): Limited activity within patient's tolerance;Monitored during session;Repositioned    Home Living                      Prior Function            PT Goals (current goals can now be found in the care plan section) Acute Rehab PT Goals Patient Stated Goal: to go home today PT Goal Formulation: With patient Time For Goal Achievement: 08/17/21 Potential to Achieve Goals: Good Progress towards PT goals: Progressing toward goals     Frequency    Min 3X/week      PT Plan Discharge plan needs to be updated    Co-evaluation              AM-PAC PT "6 Clicks" Mobility   Outcome Measure  Help needed turning from your back to your side while in a flat bed without using bedrails?: None Help needed moving from lying on your back to sitting on the side of a flat bed without using bedrails?: None Help needed moving to and from a bed to a chair (including a wheelchair)?: A Little Help needed standing up from a chair using your arms (e.g., wheelchair or bedside chair)?: A Little Help needed to walk in hospital room?: A Little Help needed climbing 3-5 steps with a railing? : A Lot 6 Click Score: 19    End of Session Equipment Utilized During Treatment: Gait belt Activity Tolerance: Patient tolerated treatment well Patient left: with call bell/phone within reach;in bed;with bed alarm set Nurse Communication: Mobility status PT Visit Diagnosis: Muscle weakness (generalized) (M62.81);Difficulty in walking, not elsewhere classified (R26.2);Unsteadiness on feet (R26.81)     Time: 6283-1517 PT Time Calculation (min) (ACUTE ONLY): 29 min  Charges:  $Gait Training: 8-22 mins $Therapeutic Activity: 8-22 mins                     Wynn Maudlin, DPT Acute Rehabilitation Services Office [phone] Pager [phone]    Anitra Lauth 08/07/2021, 10:16 AM

## 2021-08-07 NOTE — Progress Notes (Signed)
PROGRESS NOTE    Holly Roberts  ZOX:[id number] DOB: 1966/06/25 DOA: 07/29/2021 PCP: Pcp, No    Brief Narrative:  Holly Roberts is a 55 year old female with past medical history significant for bipolar disorder, anxiety/depression, chronic pain syndrome, morbid obesity who presented to [facility] ED on 8/13 with nausea, decreased oral intake, confusion.  Daughter reports poor oral intake to include liquids/solids following laparoscopic cholecystectomy.  Originally admitted on 04/20/2021 for cold cholecystitis s/p laparoscopic cholecystectomy on 5/6 with postoperative course complicated by postcholecystectomy diarrhea in which she was started on cholestyramine.  Patient was readmitted on 6/24 2022 with 4-day history of vomiting/diarrhea and poor oral intake associated with a lipase of 263, AKI and UTI with high anion gap metabolic acidosis, hypoglycemia, hyperbilirubinemia; which illness resolved with IV fluid therapy and subsequent discharge on 06/10/2021.  Patient then represented to the ED on 07/24/2021 with emesis, abdominal pain, diarrhea with anion gap elevated to 16 and symptoms resolved with IV fluids and was subsequently discharged.  In the ED, patient was noted to be tachycardic, tachycardia hypnic, afebrile with normal SPO2.  Severe anion gap metabolic acidosis of 28 with albumin 5.5, slightly elevated ammonia level but negative salicylate.  Normal lactic acid, normal Tylenol level and normal kidney function.  EDP started omeprazole after discussing with poison control and she received 3 A of sodium bicarbonate, given 1 dose of empiric antibiotics and 1 L of IV fluid.  Patient was initially admitted to the [facility] service for severe anion gap metabolic acidosis with associated nausea and vomiting likely secondary to starvation ketosis.  Patient transferred to [facility] on 08/02/2021.   Assessment & Plan:   Active Problems:   Metabolic acidosis   Acute encephalopathy   Pressure injury of skin   Acute toxic  metabolic encephalopathy; POA: Improving Etiology likely secondary to her underlying starvation ketosis, noncompliance with her home medications.  CT head without contrast on admission with chronic right PCA infarct, otherwise no acute intracranial abnormality.  CTA chest negative for PE, no pneumonia or pulmonary infarction and otherwise unrevealing.  CT abdomen/pelvis with mild acute pancreatitis, otherwise unrevealing.  Continue treatment as below.  Severe anion gap metabolic acidosis 2/2 starvation ketosis: Resolved Nausea/vomiting Patient presenting with confusion, found to have elevated anion gap with associated nausea and vomiting in the setting of poor oral intake.  Onset of symptoms related to recent cholecystectomy on 04/21/2021.  Multiple ED presentations/hospitalizations for similar events following surgical procedure.  Also question if she is compliant with her home medications. --Anion Gap 28>>17>14>7 --Currently tolerating advance diet --Zofran as needed for nausea --Continue to monitor BMP, magnesium, phosphorus daily  Acute renal failure: Resolved Creatinine elevated 1.26 on arrival.  Likely secondary to prerenal azotemia from dehydration.  Started on IV fluids on arrival with resolution. --Cr 1.26>>0.64>0.71>0.63  Refeeding syndrome Patient with poor oral intake since her cholecystectomy on 04/21/2021.  Multiple hospitalizations with nausea/vomiting with family reports of poor oral intake.  Patient was noted to have low potassium, magnesium and phosphorus which have been supplemented. --Encourage increase oral intake; currently tolerating --Dietitian following, appreciate assistance  Bipolar disorder Anxiety/depression --Vraylar 6mg  PO daily --Trintellix 20mg  PO daily --Resumed home doxepin at reduced dose 50 mg p.o. nightly  Diarrhea C. difficile negative.  Etiology likely secondary to malabsorption. --Started cholestyramine BID --Imodium as needed  GERD: Protonix 40 mg  p.o. daily  Essential hypertension BP 111/83 this morning --Amlodipine 10 mg p.o. daily --Metoprolol tartrate 50 mg p.o. twice daily --Labetalol/hydralazine as needed --Continue monitor BP  closely  Sinus tachycardia Patient develops elevated heart rate while mobilizing, likely secondary to severe deconditioning versus orthostasis. --Metoprolol tartrate to 50 mg p.o. BID  Sacral ulcer, stage II; POA Pressure Injury 07/30/21 Sacrum Mid Stage 2 -  Partial thickness loss of dermis presenting as a shallow open injury with a red, pink wound bed without slough. redness (nonblanchable), open area of skin (Active)  07/30/21 1110  Location: Sacrum  Location Orientation: Mid  Staging: Stage 2 -  Partial thickness loss of dermis presenting as a shallow open injury with a red, pink wound bed without slough.  Wound Description (Comments): redness (nonblanchable), open area of skin  Present on Admission: Yes  --Continue local wound care, offloading   DVT prophylaxis: heparin injection 5,000 Units Start: 07/30/21 1400   Code Status: Full Code Family Communication: No family present at bedside this morning Disposition Plan:  Level of care: Telemetry Status is: Inpatient  Remains inpatient appropriate because:Persistent severe electrolyte disturbances, Altered mental status, Unsafe d/c plan, IV treatments appropriate due to intensity of illness or inability to take PO, and Inpatient level of care appropriate due to severity of illness  Dispo: The patient is from: Home              Anticipated d/c is to: SNF              Patient currently is medically stable to d/c.   Difficult to place patient No  Consultants:  PCCM - signed off 8/17  Procedures:  None  Antimicrobials:  Ceftriaxone 8/14 - 8/14 Metronidazole 8/14 - 8/14   Subjective: Patient seen examined at bedside, resting comfortably.  Updated daughter present at bedside.  Awaiting bed offers for SNF placement. No other questions or  concerns at this time.  Denies headache, no chest pain, no palpitations, no shortness of breath, no abdominal pain.  No acute events overnight per nursing staff.  Objective: Vitals:   08/06/21 0444 08/06/21 1949 08/07/21 0428 08/07/21 0430  BP:  117/83 127/76   Pulse:  83 77   Resp:  18 20   Temp:  98 F (36.7 C) 98 F (36.7 C)   TempSrc:  Oral Oral   SpO2:  95% 97%   Weight: 118.6 kg   117.6 kg  Height:        Intake/Output Summary (Last 24 hours) at 08/07/2021 1253 Last data filed at 08/06/2021 2126 Gross per 24 hour  Intake 440 ml  Output --  Net 440 ml   Filed Weights   08/05/21 0458 08/06/21 0444 08/07/21 0430  Weight: 120.1 kg 118.6 kg 117.6 kg    Examination:  General exam: Appears calm and comfortable Respiratory system: Clear to auscultation. Respiratory effort normal.  On room air Cardiovascular system: S1 & S2 heard, RRR. No JVD, murmurs, rubs, gallops or clicks. No pedal edema. Gastrointestinal system: Abdomen is nondistended, soft and nontender. No organomegaly or masses felt. Normal bowel sounds heard. Central nervous system: Alert and oriented to person/place/time; No focal neurological deficits. Extremities: Moves all extremities independently Skin: Sacral ulcer noted, otherwise no rashes, lesions or ulcers Psychiatry: Judgement and insight appear appropriate. Mood & affect appropriate.     Data Reviewed: I have personally reviewed following labs and imaging studies  CBC: Recent Labs  Lab 08/01/21 0246  WBC 7.3  HGB 12.0  HCT 34.8*  MCV 86.1  PLT 204   Basic Metabolic Panel: Recent Labs  Lab 07/31/21 1915 08/01/21 0246 08/02/21 0315 08/03/21 0753 08/04/21 0525  NA  144 143 141 145 141  K 2.9* 3.2* 3.1* 3.2* 3.6  CL 105 102 99 101 104  CO2 21* 24 25 30 30   GLUCOSE 102* 104* 96 89 88  BUN 6 <5* <5* 7 6  CREATININE 0.85 0.67 0.64 0.71 0.63  CALCIUM 9.5 8.9 9.2 9.8 8.8*  MG  --  1.6* 2.3 2.4 2.1  PHOS  --  1.9* 2.7 3.8 4.4    GFR: Estimated Creatinine Clearance: 100.2 mL/min (by C-G formula based on SCr of 0.63 mg/dL). Liver Function Tests: No results for input(s): AST, ALT, ALKPHOS, BILITOT, PROT, ALBUMIN in the last 168 hours.  No results for input(s): LIPASE, AMYLASE in the last 168 hours.  No results for input(s): AMMONIA in the last 168 hours.  Coagulation Profile: No results for input(s): INR, PROTIME in the last 168 hours.  Cardiac Enzymes: No results for input(s): CKTOTAL, CKMB, CKMBINDEX, TROPONINI in the last 168 hours.  BNP (last 3 results) No results for input(s): PROBNP in the last 8760 hours. HbA1C: No results for input(s): HGBA1C in the last 72 hours. CBG: Recent Labs  Lab 07/31/21 1438 08/04/21 0732 08/04/21 1142 08/05/21 1121 08/05/21 1628  GLUCAP 95 88 115* 111* 123*   Lipid Profile: No results for input(s): CHOL, HDL, LDLCALC, TRIG, CHOLHDL, LDLDIRECT in the last 72 hours.  Thyroid Function Tests: No results for input(s): TSH, T4TOTAL, FREET4, T3FREE, THYROIDAB in the last 72 hours. Anemia Panel: No results for input(s): VITAMINB12, FOLATE, FERRITIN, TIBC, IRON, RETICCTPCT in the last 72 hours. Sepsis Labs: No results for input(s): PROCALCITON, LATICACIDVEN in the last 168 hours.   Recent Results (from the past 240 hour(s))  Blood culture (routine single)     Status: Abnormal   Collection Time: 07/30/21  2:40 AM   Specimen: BLOOD  Result Value Ref Range Status   Specimen Description   Final    BLOOD RIGHT ANTECUBITAL Performed at [facility], [address], [place], [place] [address]    Special Requests   Final    BOTTLES DRAWN AEROBIC AND ANAEROBIC Blood Culture results may not be optimal due to an inadequate volume of blood received in culture bottles Performed at [facility], [address] [place], [place] [address]    Culture  Setup Time   Final    GRAM POSITIVE COCCI IN CLUSTERS AEROBIC BOTTLE ONLY Organism ID to  follow CRITICAL RESULT CALLED TO, READ BACK BY AND VERIFIED WITH: Sophronia Simas PHARMD 1900 08/03/21 A BROWNING    Culture (A)  Final    MICROCOCCUS LUTEUS/LYLAE THE SIGNIFICANCE OF ISOLATING THIS ORGANISM FROM A SINGLE SET OF BLOOD CULTURES WHEN MULTIPLE SETS ARE DRAWN IS UNCERTAIN. PLEASE NOTIFY THE MICROBIOLOGY DEPARTMENT WITHIN ONE WEEK IF SPECIATION AND SENSITIVITIES ARE REQUIRED. Performed at [facility], [address], [place], [place] [address]    Report Status 08/05/2021 FINAL  Final  Blood Culture ID Panel (Reflexed)     Status: None   Collection Time: 07/30/21  2:40 AM  Result Value Ref Range Status   Enterococcus faecalis NOT DETECTED NOT DETECTED Final   Enterococcus Faecium NOT DETECTED NOT DETECTED Final   Listeria monocytogenes NOT DETECTED NOT DETECTED Final   Staphylococcus species NOT DETECTED NOT DETECTED Final   Staphylococcus aureus (BCID) NOT DETECTED NOT DETECTED Final   Staphylococcus epidermidis NOT DETECTED NOT DETECTED Final   Staphylococcus lugdunensis NOT DETECTED NOT DETECTED Final   Streptococcus species NOT DETECTED NOT DETECTED Final   Streptococcus  agalactiae NOT DETECTED NOT DETECTED Final   Streptococcus pneumoniae NOT DETECTED NOT DETECTED Final   Streptococcus pyogenes NOT DETECTED NOT DETECTED Final   A.calcoaceticus-baumannii NOT DETECTED NOT DETECTED Final   Bacteroides fragilis NOT DETECTED NOT DETECTED Final   Enterobacterales NOT DETECTED NOT DETECTED Final   Enterobacter cloacae complex NOT DETECTED NOT DETECTED Final   Escherichia coli NOT DETECTED NOT DETECTED Final   Klebsiella aerogenes NOT DETECTED NOT DETECTED Final   Klebsiella oxytoca NOT DETECTED NOT DETECTED Final   Klebsiella pneumoniae NOT DETECTED NOT DETECTED Final   Proteus species NOT DETECTED NOT DETECTED Final   Salmonella species NOT DETECTED NOT DETECTED Final   Serratia marcescens NOT DETECTED NOT DETECTED Final   Haemophilus influenzae NOT DETECTED NOT DETECTED  Final   Neisseria meningitidis NOT DETECTED NOT DETECTED Final   Pseudomonas aeruginosa NOT DETECTED NOT DETECTED Final   Stenotrophomonas maltophilia NOT DETECTED NOT DETECTED Final   Candida albicans NOT DETECTED NOT DETECTED Final   Candida auris NOT DETECTED NOT DETECTED Final   Candida glabrata NOT DETECTED NOT DETECTED Final   Candida krusei NOT DETECTED NOT DETECTED Final   Candida parapsilosis NOT DETECTED NOT DETECTED Final   Candida tropicalis NOT DETECTED NOT DETECTED Final   Cryptococcus neoformans/gattii NOT DETECTED NOT DETECTED Final    Comment: Performed at [facility], [address] [place], [place] [address]  SARS CORONAVIRUS 2 (TAT 6-24 HRS) Nasopharyngeal Nasopharyngeal Swab     Status: None   Collection Time: 07/30/21 10:22 AM   Specimen: Nasopharyngeal Swab  Result Value Ref Range Status   SARS Coronavirus 2 NEGATIVE NEGATIVE Final    Comment: (NOTE) SARS-CoV-2 target nucleic acids are NOT DETECTED.  The SARS-CoV-2 RNA is generally detectable in upper and lower respiratory specimens during the acute phase of infection. Negative results do not preclude SARS-CoV-2 infection, do not rule out co-infections with other pathogens, and should not be used as the sole basis for treatment or other patient management decisions. Negative results must be combined with clinical observations, patient history, and epidemiological information. The expected result is Negative.  Fact Sheet for Patients: HairSlick.no  Fact Sheet for Healthcare Providers: quierodirigir.com  This test is not yet approved or cleared by the [place] FDA and  has been authorized for detection and/or diagnosis of SARS-CoV-2 by FDA under an Emergency Use Authorization (EUA). This EUA will remain  in effect (meaning this test can be used) for the duration of the COVID-19 declaration under Se ction 564(b)(1) of the Act, 21  U.S.C. section 360bbb-3(b)(1), unless the authorization is terminated or revoked sooner.  Performed at [facility], [address] [place], [place] [address]   MRSA Next Gen by PCR, Nasal     Status: None   Collection Time: 07/30/21 11:19 AM   Specimen: Nasal Mucosa; Nasal Swab  Result Value Ref Range Status   MRSA by PCR Next Gen NOT DETECTED NOT DETECTED Final    Comment: (NOTE) The GeneXpert MRSA Assay (FDA approved for NASAL specimens only), is one component of a comprehensive MRSA colonization surveillance program. It is not intended to diagnose MRSA infection nor to guide or monitor treatment for MRSA infections. Test performance is not FDA approved in patients less than 71 years old. Performed at [facility], [address], [place], [place] [address]   Urine Culture     Status: None   Collection Time: 07/30/21  2:50 PM   Specimen: In/Out Cath Urine  Result Value Ref Range Status   Specimen Description   Final    IN/OUT CATH URINE Performed at [facility], [address] [place], [place] [address]    Special Requests   Final    NONE Performed at [facility], [address] [place], [place] [address]    Culture   Final    NO GROWTH Performed at [facility], [address], [place], [place] [address]    Report Status 07/31/2021 FINAL  Final  C Difficile Quick Screen (NO PCR Reflex)     Status: None   Collection Time: 08/01/21  4:20 PM   Specimen: STOOL  Result Value Ref Range Status   C Diff antigen NEGATIVE NEGATIVE Final   C Diff toxin NEGATIVE NEGATIVE Final   C Diff interpretation No C. difficile detected.  Final    Comment: Performed at [facility], [address] [place], [place] [address]         Radiology Studies: No results found.      Scheduled Meds:  amLODipine  10 mg Oral Daily   cariprazine  6 mg Oral Daily   chlorhexidine  15 mL Mouth Rinse  BID   Chlorhexidine Gluconate Cloth  6 each Topical Daily   cholestyramine light  4 g Oral 2 times per day   doxepin  50 mg Oral QHS   feeding supplement  237 mL Oral BID BM   folic acid  1 mg Oral Daily   gabapentin  300 mg Oral TID   heparin  5,000 Units Subcutaneous Q8H   mouth rinse  15 mL Mouth Rinse q12n4p   melatonin  3 mg Oral QHS   metoprolol tartrate  50 mg Oral BID   multivitamin with minerals  1 tablet Oral Daily   nutrition supplement (JUVEN)  1 packet Oral BID BM   pantoprazole  40 mg Oral Daily   tapentadol  200 mg Oral TID   thiamine  100 mg Oral Daily   vortioxetine HBr  20 mg Oral Daily   Continuous Infusions:  sodium chloride 10 mL/hr at 08/01/21 0600     LOS: 8 days    Time spent: 37 minutes spent on chart review, discussion with nursing staff, consultants, updating family and interview/physical exam; more than 50% of that time was spent in counseling and/or coordination of care.    Alvira Philips [place], DO [facility] Available via Epic secure chat 7am-7pm After these hours, please refer to coverage provider listed on [email or web address] 08/07/2021, 12:53 PM

## 2021-08-07 NOTE — Progress Notes (Signed)
Occupational Therapy Treatment Patient Details Name: Holly Roberts MRN: [id number] DOB: 30-Aug-1966 Today's Date: 08/07/2021    History of present illness Patient is a 55 year old female who presented to the hosptial on 8/13 with confusion, decreased oral intake, and nausea. patient was found to have acute toxic metabolic encephalopathy,Severe anion gap metabolic acidosis, acute renal failure, and refeeding syndrome. PMH: Pipolar,HTN, and GERD   OT comments  Patient was educated on LB dressing with AE sitting on edge of bed. Patient was able to demonstrate understanding with increased time. Patient reported she would look into total hip kit purchase. Patient's discharge plan changed to SNF with patients decreased activity tolerance and cardiopulmonary endurance.  OT will continue to follow acutely.    Follow Up Recommendations  SNF    Equipment Recommendations  None recommended by OT    Recommendations for Other Services      Precautions / Restrictions Precautions Precautions: Fall Precaution Comments: monitor BP and HR Restrictions Weight Bearing Restrictions: No       Mobility Bed Mobility Overal bed mobility: Needs Assistance Bed Mobility: Supine to Sit     Supine to sit: Min guard;Min assist     General bed mobility comments: pt taking extra time and effort, assist and cues for use of bed rail required    Transfers Overall transfer level: Needs assistance Equipment used: Rolling walker (2 wheeled) Transfers: Sit to/from Stand Sit to Stand: Min guard;From elevated surface         General transfer comment: pt using bil UE's for power up and reach back to control lowering. min guard for safety.    Balance Overall balance assessment: Needs assistance Sitting-balance support: No upper extremity supported Sitting balance-Leahy Scale: Good     Standing balance support: During functional activity Standing balance-Leahy Scale: Fair                              ADL either performed or assessed with clinical judgement   ADL                       Lower Body Dressing: Minimal assistance;Cueing for compensatory techniques Lower Body Dressing Details (indicate cue type and reason): patient was educated on total hip kit with patient demonstrating the ability to participate in LB dressing with increased independence. patient was able to doff/don socks with min guard with education on proper seuqencing sitting on edge of bed.               General ADL Comments: patients HR sitting on edge of bed was 97 bpm. patient after attempting standing increased to 116bpm in standing. patient reported being dizzy with attempt to stand.     Vision Patient Visual Report: No change from baseline     Perception     Praxis      Cognition Arousal/Alertness: Awake/alert Behavior During Therapy: WFL for tasks assessed/performed Overall Cognitive Status: Within Functional Limits for tasks assessed                                          Exercises Exercises: Other exercises Other Exercises Other Exercises: 5x Sit<>Stand from recliner with bil UE use for power up and to control lowering.   Shoulder Instructions       General Comments      Pertinent  Vitals/ Pain       Pain Assessment: No/denies pain Faces Pain Scale: Hurts little more Pain Location: general back and knees Pain Descriptors / Indicators: Aching Pain Intervention(s): Limited activity within patient's tolerance;Monitored during session;Repositioned  Home Living                                          Prior Functioning/Environment              Frequency  Min 2X/week        Progress Toward Goals  OT Goals(current goals can now be found in the care plan section)  Progress towards OT goals: Progressing toward goals  Acute Rehab OT Goals Patient Stated Goal: to go home today  Plan Discharge plan needs to be updated     Co-evaluation                 AM-PAC OT "6 Clicks" Daily Activity     Outcome Measure   Help from another person eating meals?: None Help from another person taking care of personal grooming?: A Little Help from another person toileting, which includes using toliet, bedpan, or urinal?: A Little Help from another person bathing (including washing, rinsing, drying)?: A Little Help from another person to put on and taking off regular upper body clothing?: None Help from another person to put on and taking off regular lower body clothing?: A Little 6 Click Score: 20    End of Session    OT Visit Diagnosis: Muscle weakness (generalized) (M62.81);Unsteadiness on feet (R26.81)   Activity Tolerance Patient tolerated treatment well   Patient Left in bed;with call bell/phone within reach   Nurse Communication          Time: [phone] OT Time Calculation (min): 30 min  Charges: OT General Charges $OT Visit: 1 Visit OT Treatments $Self Care/Home Management : 23-37 mins  Sharyn Blitz OTR/L, MS Acute Rehabilitation Department Office# [phone] Pager# [phone]    [name] Holly Roberts 08/07/2021, 10:47 AM

## 2021-08-08 DIAGNOSIS — I10 Essential (primary) hypertension: Secondary | ICD-10-CM | POA: Diagnosis present

## 2021-08-08 DIAGNOSIS — K9089 Other intestinal malabsorption: Secondary | ICD-10-CM | POA: Diagnosis present

## 2021-08-08 NOTE — Plan of Care (Signed)
  Problem: Education: Goal: Knowledge of General Education information will improve Description: Including pain rating scale, medication(s)/side effects and non-pharmacologic comfort measures Outcome: Progressing   Problem: Clinical Measurements: Goal: Ability to maintain clinical measurements within normal limits will improve Outcome: Progressing Goal: Diagnostic test results will improve Outcome: Progressing Goal: Respiratory complications will improve Outcome: Progressing Goal: Cardiovascular complication will be avoided Outcome: Progressing   Problem: Nutrition: Goal: Adequate nutrition will be maintained Outcome: Progressing   Problem: Coping: Goal: Level of anxiety will decrease Outcome: Progressing   Problem: Pain Managment: Goal: General experience of comfort will improve Outcome: Progressing   Problem: Safety: Goal: Ability to remain free from injury will improve Outcome: Progressing

## 2021-08-08 NOTE — TOC Progression Note (Signed)
Transition of Care [facility]) - Progression Note    Patient Details  Name: Holly Roberts MRN: [id number] Date of Birth: [birth year]  [facility] [place] CM/SW Contact  Ida Rogue, [place] Phone Number: 08/08/2021, 11:47 AM  Clinical Narrative:   Patient selects kind bed offer from [facility].  Georgeann Oppenheim is initiating insurance authorization today. TOC will continue to follow during the course of hospitalization.     Expected Discharge Plan: Skilled Nursing Facility Barriers to Discharge: Other (must enter comment) Scientist, water quality authorization)  Expected Discharge Plan and Services Expected Discharge Plan: Skilled Nursing Facility   Discharge Planning Services: CM Consult Post Acute Care Choice: Skilled Nursing Facility Living arrangements for the past 2 months: Apartment                                       Social Determinants of Health (SDOH) Interventions    Readmission Risk Interventions No flowsheet data found.

## 2021-08-08 NOTE — Progress Notes (Signed)
PROGRESS NOTE    Holly Roberts  VQQ:[id number] DOB: [birth year] DOA: 07/29/2021 PCP: Pcp, No    Brief Narrative:  Holly Roberts is a 55 year old female with past medical history significant for bipolar disorder, anxiety/depression, chronic pain syndrome, morbid obesity who presented to [facility] ED on 8/13 with nausea, decreased oral intake, confusion.  Daughter reports poor oral intake to include liquids/solids following laparoscopic cholecystectomy.  Originally admitted on 04/20/2021 for cold cholecystitis s/p laparoscopic cholecystectomy on 5/6 with postoperative course complicated by postcholecystectomy diarrhea in which she was started on cholestyramine.  Patient was readmitted on 6/24 2022 with 4-day history of vomiting/diarrhea and poor oral intake associated with a lipase of 263, AKI and UTI with high anion gap metabolic acidosis, hypoglycemia, hyperbilirubinemia; which illness resolved with IV fluid therapy and subsequent discharge on 06/10/2021.  Patient then represented to the ED on 07/24/2021 with emesis, abdominal pain, diarrhea with anion gap elevated to 16 and symptoms resolved with IV fluids and was subsequently discharged.  In the ED, patient was noted to be tachycardic, tachycardia hypnic, afebrile with normal SPO2.  Severe anion gap metabolic acidosis of 28 with albumin 5.5, slightly elevated ammonia level but negative salicylate.  Normal lactic acid, normal Tylenol level and normal kidney function.  EDP started omeprazole after discussing with poison control and she received 3 A of sodium bicarbonate, given 1 dose of empiric antibiotics and 1 L of IV fluid.  Patient was initially admitted to the [facility] service for severe anion gap metabolic acidosis with associated nausea and vomiting likely secondary to starvation ketosis.  Patient transferred to [facility] on 08/02/2021.   Assessment & Plan:   Active Problems:   Metabolic acidosis   Acute encephalopathy   Pressure injury of skin   Acute toxic  metabolic encephalopathy; POA: Improving Etiology likely secondary to her underlying starvation ketosis, noncompliance with her home medications.  CT head without contrast on admission with chronic right PCA infarct, otherwise no acute intracranial abnormality.  CTA chest negative for PE, no pneumonia or pulmonary infarction and otherwise unrevealing.  CT abdomen/pelvis with mild acute pancreatitis, otherwise unrevealing.  Continue treatment as below.  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  Onset of symptoms related to recent cholecystectomy on 04/21/2021.  Multiple ED presentations/hospitalizations for similar events following surgical procedure.  Also question if she is compliant with her home medications.  C. difficile negative.  Believe this is all related to bile acid malabsorption as she has improved remarkably taking cholestyramine. --Anion Gap 28>>17>14>7 -- Tolerating diet --Zofran as needed for nausea --Cholestyramine twice daily --BMP, magnesium, phosphorus repeat in am  Acute renal failure: Resolved Creatinine elevated 1.26 on arrival.  Likely secondary to prerenal azotemia from dehydration.  Started on IV fluids on arrival with resolution. --Cr 1.26>>0.64>0.71>0.63  Refeeding syndrome Patient with poor oral intake since her cholecystectomy on 04/21/2021.  Multiple hospitalizations with nausea/vomiting with family reports of poor oral intake.  Patient was noted to have low potassium, magnesium and phosphorus which have been supplemented. --Encourage increase oral intake; currently tolerating --Dietitian following, appreciate assistance  Bipolar disorder Anxiety/depression --Vraylar 6mg  PO daily --Trintellix 20mg  PO daily --Resumed home doxepin at reduced  dose 50 mg p.o. nightly --Ativan as needed  GERD: Protonix 40 mg p.o. daily  Essential hypertension BP 116/73 this morning --Amlodipine 10 mg p.o. daily --  Metoprolol tartrate 50 mg p.o. twice daily --Labetalol/hydralazine as needed --Continue monitor BP closely  Sinus tachycardia Patient develops elevated heart rate while mobilizing, likely secondary to severe deconditioning versus orthostasis. --Metoprolol tartrate to 50 mg p.o. BID  Sacral ulcer, stage II; POA Pressure Injury 07/30/21 Sacrum Mid Stage 2 -  Partial thickness loss of dermis presenting as a shallow open injury with a red, pink wound bed without slough. redness (nonblanchable), open area of skin (Active)  07/30/21 1110  Location: Sacrum  Location Orientation: Mid  Staging: Stage 2 -  Partial thickness loss of dermis presenting as a shallow open injury with a red, pink wound bed without slough.  Wound Description (Comments): redness (nonblanchable), open area of skin  Present on Admission: Yes  --Continue local wound care, offloading  Morbid obesity Body mass index is 44.27 kg/m.  Discussed with patient needs for aggressive lifestyle changes/weight loss as this complicates all facets of care.  Outpatient follow-up with PCP.  May benefit from bariatric evaluation outpatient.   Weakness/deconditioning/gait disturbance: --Continue PT/OT efforts while inpatient --Awaiting SNF placement, TOC for assistance   DVT prophylaxis: heparin injection 5,000 Units Start: 07/30/21 1400   Code Status: Full Code Family Communication: No family present at bedside this morning, updated patient's daughter present at bedside extensively yesterday afternoon Disposition Plan:  Level of care: Telemetry Status is: Inpatient  Remains inpatient appropriate because:Persistent severe electrolyte disturbances, Altered mental status, Unsafe d/c plan, IV treatments appropriate due to intensity of illness or inability to take PO, and Inpatient  level of care appropriate due to severity of illness  Dispo: The patient is from: Home              Anticipated d/c is to: SNF              Patient currently is medically stable to d/c.   Difficult to place patient No  Consultants:  PCCM - signed off 8/17  Procedures:  None  Antimicrobials:  Ceftriaxone 8/14 - 8/14 Metronidazole 8/14 - 8/14   Subjective: Patient seen examined at bedside, resting comfortably.  Per social work, received to bed offers today, social work discussed with patient and family today.  No other questions or concerns at this time.  Denies headache, no chest pain, no palpitations, no shortness of breath, no abdominal pain.  No acute events overnight per nursing staff.  Objective: Vitals:   08/07/21 1500 08/07/21 1938 08/08/21 0443 08/08/21 0445  BP: 118/75 124/79 116/73   Pulse: 73 89 76   Resp: 20 20 20    Temp: (!) 97.5 F (36.4 C) 97.9 F (36.6 C) 97.7 F (36.5 C)   TempSrc: Oral Oral Oral   SpO2: 95% 98% 94%   Weight:    117 kg  Height:        Intake/Output Summary (Last 24 hours) at 08/08/2021 1037 Last data filed at 08/08/2021 0800 Gross per 24 hour  Intake 240 ml  Output --  Net 240 ml   Filed Weights   08/06/21 0444 08/07/21 0430 08/08/21 0445  Weight: 118.6 kg 117.6 kg 117 kg    Examination:  General exam: Appears calm and comfortable, obese Respiratory system: Clear to auscultation. Respiratory effort normal.  On room air Cardiovascular system: S1 & S2 heard, RRR. No JVD, murmurs, rubs, gallops or clicks. No pedal edema. Gastrointestinal system: Abdomen is nondistended, soft and nontender. No organomegaly or masses felt. Normal bowel sounds heard. Central nervous system: Alert and oriented to person/place/time; No focal neurological deficits.  Extremities: Moves all extremities independently Skin: Sacral ulcer noted, otherwise no rashes, lesions or ulcers Psychiatry: Judgement and insight appear appropriate. Mood & affect  appropriate.     Data Reviewed: I have personally reviewed following labs and imaging studies  CBC: No results for input(s): WBC, NEUTROABS, HGB, HCT, MCV, PLT in the last 168 hours.  Basic Metabolic Panel: Recent Labs  Lab 08/02/21 0315 08/03/21 0753 08/04/21 0525  NA 141 145 141  K 3.1* 3.2* 3.6  CL 99 101 104  CO2 25 30 30   GLUCOSE 96 89 88  BUN <5* 7 6  CREATININE 0.64 0.71 0.63  CALCIUM 9.2 9.8 8.8*  MG 2.3 2.4 2.1  PHOS 2.7 3.8 4.4   GFR: Estimated Creatinine Clearance: 99.8 mL/min (by C-G formula based on SCr of 0.63 mg/dL). Liver Function Tests: No results for input(s): AST, ALT, ALKPHOS, BILITOT, PROT, ALBUMIN in the last 168 hours.  No results for input(s): LIPASE, AMYLASE in the last 168 hours.  No results for input(s): AMMONIA in the last 168 hours.  Coagulation Profile: No results for input(s): INR, PROTIME in the last 168 hours.  Cardiac Enzymes: No results for input(s): CKTOTAL, CKMB, CKMBINDEX, TROPONINI in the last 168 hours.  BNP (last 3 results) No results for input(s): PROBNP in the last 8760 hours. HbA1C: No results for input(s): HGBA1C in the last 72 hours. CBG: Recent Labs  Lab 08/04/21 0732 08/04/21 1142 08/05/21 1121 08/05/21 1628  GLUCAP 88 115* 111* 123*   Lipid Profile: No results for input(s): CHOL, HDL, LDLCALC, TRIG, CHOLHDL, LDLDIRECT in the last 72 hours.  Thyroid Function Tests: No results for input(s): TSH, T4TOTAL, FREET4, T3FREE, THYROIDAB in the last 72 hours. Anemia Panel: No results for input(s): VITAMINB12, FOLATE, FERRITIN, TIBC, IRON, RETICCTPCT in the last 72 hours. Sepsis Labs: No results for input(s): PROCALCITON, LATICACIDVEN in the last 168 hours.   Recent Results (from the past 240 hour(s))  Blood culture (routine single)     Status: Abnormal   Collection Time: 07/30/21  2:40 AM   Specimen: BLOOD  Result Value Ref Range Status   Specimen Description   Final    BLOOD RIGHT ANTECUBITAL Performed at  [facility], [address] [place], [place] [address]    Special Requests   Final    BOTTLES DRAWN AEROBIC AND ANAEROBIC Blood Culture results may not be optimal due to an inadequate volume of blood received in culture bottles Performed at [facility], [address], [place], [place] [address]    Culture  Setup Time   Final    GRAM POSITIVE COCCI IN CLUSTERS AEROBIC BOTTLE ONLY Organism ID to follow CRITICAL RESULT CALLED TO, READ BACK BY AND VERIFIED WITH: Sophronia Simas PHARMD 1900 08/03/21 A BROWNING    Culture (A)  Final    MICROCOCCUS LUTEUS/LYLAE THE SIGNIFICANCE OF ISOLATING THIS ORGANISM FROM A SINGLE SET OF BLOOD CULTURES WHEN MULTIPLE SETS ARE DRAWN IS UNCERTAIN. PLEASE NOTIFY THE MICROBIOLOGY DEPARTMENT WITHIN ONE WEEK IF SPECIATION AND SENSITIVITIES ARE REQUIRED. Performed at [facility], [address], [place], [place] [address]    Report Status 08/05/2021 FINAL  Final  Blood Culture ID Panel (Reflexed)     Status: None   Collection Time: 07/30/21  2:40 AM  Result Value Ref Range Status   Enterococcus faecalis NOT DETECTED NOT DETECTED Final   Enterococcus Faecium NOT DETECTED NOT DETECTED Final   Listeria monocytogenes NOT DETECTED NOT DETECTED Final   Staphylococcus  species NOT DETECTED NOT DETECTED Final   Staphylococcus aureus (BCID) NOT DETECTED NOT DETECTED Final   Staphylococcus epidermidis NOT DETECTED NOT DETECTED Final   Staphylococcus lugdunensis NOT DETECTED NOT DETECTED Final   Streptococcus species NOT DETECTED NOT DETECTED Final   Streptococcus agalactiae NOT DETECTED NOT DETECTED Final   Streptococcus pneumoniae NOT DETECTED NOT DETECTED Final   Streptococcus pyogenes NOT DETECTED NOT DETECTED Final   A.calcoaceticus-baumannii NOT DETECTED NOT DETECTED Final   Bacteroides fragilis NOT DETECTED NOT DETECTED Final   Enterobacterales NOT DETECTED NOT DETECTED Final   Enterobacter cloacae complex NOT DETECTED NOT  DETECTED Final   Escherichia coli NOT DETECTED NOT DETECTED Final   Klebsiella aerogenes NOT DETECTED NOT DETECTED Final   Klebsiella oxytoca NOT DETECTED NOT DETECTED Final   Klebsiella pneumoniae NOT DETECTED NOT DETECTED Final   Proteus species NOT DETECTED NOT DETECTED Final   Salmonella species NOT DETECTED NOT DETECTED Final   Serratia marcescens NOT DETECTED NOT DETECTED Final   Haemophilus influenzae NOT DETECTED NOT DETECTED Final   Neisseria meningitidis NOT DETECTED NOT DETECTED Final   Pseudomonas aeruginosa NOT DETECTED NOT DETECTED Final   Stenotrophomonas maltophilia NOT DETECTED NOT DETECTED Final   Candida albicans NOT DETECTED NOT DETECTED Final   Candida auris NOT DETECTED NOT DETECTED Final   Candida glabrata NOT DETECTED NOT DETECTED Final   Candida krusei NOT DETECTED NOT DETECTED Final   Candida parapsilosis NOT DETECTED NOT DETECTED Final   Candida tropicalis NOT DETECTED NOT DETECTED Final   Cryptococcus neoformans/gattii NOT DETECTED NOT DETECTED Final    Comment: Performed at [facility], [address], [place], [place] [address]  SARS CORONAVIRUS 2 (TAT 6-24 HRS) Nasopharyngeal Nasopharyngeal Swab     Status: None   Collection Time: 07/30/21 10:22 AM   Specimen: Nasopharyngeal Swab  Result Value Ref Range Status   SARS Coronavirus 2 NEGATIVE NEGATIVE Final    Comment: (NOTE) SARS-CoV-2 target nucleic acids are NOT DETECTED.  The SARS-CoV-2 RNA is generally detectable in upper and lower respiratory specimens during the acute phase of infection. Negative results do not preclude SARS-CoV-2 infection, do not rule out co-infections with other pathogens, and should not be used as the sole basis for treatment or other patient management decisions. Negative results must be combined with clinical observations, patient history, and epidemiological information. The expected result is Negative.  Fact Sheet for  Patients: HairSlick.no  Fact Sheet for Healthcare Providers: quierodirigir.com  This test is not yet approved or cleared by the [place] FDA and  has been authorized for detection and/or diagnosis of SARS-CoV-2 by FDA under an Emergency Use Authorization (EUA). This EUA will remain  in effect (meaning this test can be used) for the duration of the COVID-19 declaration under Se ction 564(b)(1) of the Act, 21 U.S.C. section 360bbb-3(b)(1), unless the authorization is terminated or revoked sooner.  Performed at [facility], [address] [place], [place] [address]   MRSA Next Gen by PCR, Nasal     Status: None   Collection Time: 07/30/21 11:19 AM   Specimen: Nasal Mucosa; Nasal Swab  Result Value Ref Range Status   MRSA by PCR Next Gen NOT DETECTED NOT DETECTED Final    Comment: (NOTE) The GeneXpert MRSA Assay (FDA approved for NASAL specimens only), is one component of a comprehensive MRSA colonization surveillance program. It is not intended to diagnose MRSA infection nor to guide or monitor treatment for MRSA infections. Test performance is not FDA  approved in patients less than 55 years old. Performed at [facility], [address] [place], [place] [address]   Urine Culture     Status: None   Collection Time: 07/30/21  2:50 PM   Specimen: In/Out Cath Urine  Result Value Ref Range Status   Specimen Description   Final    IN/OUT CATH URINE Performed at [facility], [address], [place], [place] [address]    Special Requests   Final    NONE Performed at [facility], [address] [place], [place] [address]    Culture   Final    NO GROWTH Performed at [facility], [address], [place], [place] [address]    Report Status 07/31/2021 FINAL  Final  C Difficile Quick Screen (NO PCR Reflex)     Status: None   Collection Time: 08/01/21   4:20 PM   Specimen: STOOL  Result Value Ref Range Status   C Diff antigen NEGATIVE NEGATIVE Final   C Diff toxin NEGATIVE NEGATIVE Final   C Diff interpretation No C. difficile detected.  Final    Comment: Performed at [facility], [address] [place], [place] [address]         Radiology Studies: No results found.      Scheduled Meds:  amLODipine  10 mg Oral Daily   cariprazine  6 mg Oral Daily   chlorhexidine  15 mL Mouth Rinse BID   Chlorhexidine Gluconate Cloth  6 each Topical Daily   cholestyramine light  4 g Oral 2 times per day   doxepin  50 mg Oral QHS   feeding supplement  237 mL Oral BID BM   folic acid  1 mg Oral Daily   gabapentin  300 mg Oral TID   heparin  5,000 Units Subcutaneous Q8H   mouth rinse  15 mL Mouth Rinse q12n4p   melatonin  3 mg Oral QHS   metoprolol tartrate  50 mg Oral BID   multivitamin with minerals  1 tablet Oral Daily   nutrition supplement (JUVEN)  1 packet Oral BID BM   pantoprazole  40 mg Oral Daily   tapentadol  200 mg Oral TID   thiamine  100 mg Oral Daily   vortioxetine HBr  20 mg Oral Daily   Continuous Infusions:  sodium chloride 10 mL/hr at 08/01/21 0600     LOS: 9 days    Time spent: 37 minutes spent on chart review, discussion with nursing staff, consultants, updating family and interview/physical exam; more than 50% of that time was spent in counseling and/or coordination of care.    Alvira Philips [place], DO [facility] Available via Epic secure chat 7am-7pm After these hours, please refer to coverage provider listed on [email or web address] 08/08/2021, 10:37 AM

## 2021-08-09 DIAGNOSIS — I10 Essential (primary) hypertension: Secondary | ICD-10-CM

## 2021-08-09 DIAGNOSIS — N179 Acute kidney failure, unspecified: Secondary | ICD-10-CM

## 2021-08-09 DIAGNOSIS — F419 Anxiety disorder, unspecified: Secondary | ICD-10-CM

## 2021-08-09 LAB — BASIC METABOLIC PANEL
Anion gap: 7 (ref 5–15)
BUN: 16 mg/dL (ref 6–20)
CO2: 29 mmol/L (ref 22–32)
Calcium: 9.3 mg/dL (ref 8.9–10.3)
Chloride: 103 mmol/L (ref 98–111)
Creatinine, Ser: 0.81 mg/dL (ref 0.44–1.00)
GFR, Estimated: >60 mL/min (ref >60)
Glucose, Bld: 97 mg/dL (ref 70–99)
Potassium: 4.2 mmol/L (ref 3.5–5.1)
Sodium: 139 mmol/L (ref 135–145)

## 2021-08-09 LAB — PHOSPHORUS: Phosphorus: 4.7 mg/dL — ABNORMAL HIGH (ref 2.5–4.6)

## 2021-08-09 LAB — MAGNESIUM: Magnesium: 2.3 mg/dL (ref 1.7–2.4)

## 2021-08-09 NOTE — Progress Notes (Signed)
[facility]                                                                              Patient Demographics  Holly Roberts, is a 55 y.o. female, DOB - Nov 02, 1966, BJY:[id number]  Admit date - 07/29/2021   Admitting Physician [name], MD  Outpatient Primary MD for the patient is Pcp, No  Outpatient specialists:   LOS - 10  days   Medical records reviewed and are as summarized below:    Chief Complaint  Patient presents with   Nausea       Brief summary   Patient is a 55 year old female with history of bipolar disorder, anxiety, depression, chronic pain syndrome, obesity presented to ED with nausea, decreased oral intake, confusion.  Patient daughter had reported poor oral intake to include liquids/solids following her laparoscopic cholecystectomy. Originally admitted on 5/5 for acute cholecystitis, underwent lap chole on 5/6 with postoperative course complicated by postcholecystectomy diarrhea and was started on cholestyramine.  Patient was readmitted on 06/09/2021 with 4-day history of vomiting/diarrhea and poor oral intake, lipase of 263, UTI, AKI with high AGMA, hyperglycemia, hyperbilirubinemia, resolved with IV fluid therapy and subsequently discharged on 6/25.  Patient then presented on 8/8 with emesis, abdominal pain, diarrhea, anion gap 16, symptoms resolved with IV fluids.  In ED, patient was noted to be tachycardiac, afebrile, AGMA, slightly elevated ammonia level.  Normal lactic acid, normal Tylenol level, normal kidney function. Initially admitted to [facility] for severe anion gap metabolic acidosis, associated nausea and vomiting secondary to starvation ketosis.  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  Multiple ED visits for the same. -Anion gap 28 at the time of admission, patient was placed on IV fluid hydration, antiemetics, cholestyramine -Currently improving, tolerating diet -Continue cholestyramine BID (wean slowly to daily).  Per patient her symptoms had returned in the past due to stopping it.  Acute metabolic encephalopathy, POA -Improved, currently alert and oriented x3.  Likely due to starvation ketosis, acute kidney injury -CT head showed chronic right PCA infarct, no acute intracranial abnormality. -CTA chest negative for PE, pneumonia or pulmonary infarction.  CT abdomen pelvis with mild acute pancreatitis  Acute kidney injury -Creatinine 1.26 on admission secondary to prerenal azotemia, dehydration -Patient was placed on IV fluid hydration, currently improving  Refeeding syndrome, moderate protein calorie malnutrition -Poor oral intake since cholecystectomy on 5/6.  Multiple hospitalization with nausea/vomiting, poor oral intake -Dietitian was consulted, currently improving and tolerating diet  Bipolar disorder, anxiety/depression -Currently stable, continue Vraylar, Trintellix, doxepin at reduced dose 50 mg p.o. nightly, Ativan as needed  GERD -Continue Protonix 40 mg daily  Essential hypertension -BP stable, continue metoprolol 50 mg twice a day  Pressure Injury Documentation: Pressure Injury 07/30/21 Sacrum Mid Stage 2 -  Partial thickness loss of dermis presenting as a shallow open injury with a red, pink wound bed without slough. redness (nonblanchable), open  area of skin (Active)  07/30/21 1110  Location: Sacrum  Location Orientation: Mid  Staging: Stage 2 -  Partial thickness loss of dermis presenting as a shallow open injury with a red, pink wound bed without slough.  Wound Description (Comments): redness (nonblanchable), open area of skin  Present on Admission: Yes  -Continue wound care per nursing  Morbid  obesity Estimated body mass index is 44.27 kg/m as calculated from the following:   Height as of this encounter: 5\' 4"  (1.626 m).   Weight as of this encounter: 117 kg. -Awaiting SNF placement, TOC assisting  Code Status: Full code DVT Prophylaxis:  heparin injection 5,000 Units Start: 07/30/21 1400   Level of Care: Level of care: Telemetry Family Communication: Discussed all imaging results, lab results, explained to the patient    Disposition Plan:     Status is: Inpatient  Remains inpatient appropriate because:Inpatient level of care appropriate due to severity of illness  Dispo:  Patient From: Home  Planned Disposition: Skilled Nursing Facility  Medically stable for discharge: Yes        Time Spent in minutes   25 minutes Procedures:  None   Consultants:   PCCM  Antimicrobials:   Anti-infectives (From admission, onward)    Start     Dose/Rate Route Frequency Ordered Stop   07/30/21 0530  metroNIDAZOLE (FLAGYL) IVPB 500 mg        500 mg 100 mL/hr over 60 Minutes Intravenous  Once 07/30/21 0523 07/30/21 0722   07/30/21 0500  cefTRIAXone (ROCEPHIN) 2 g in sodium chloride 0.9 % 100 mL IVPB        2 g 200 mL/hr over 30 Minutes Intravenous  Once 07/30/21 0446 07/30/21 7829          Medications  Scheduled Meds:  amLODipine  10 mg Oral Daily   cariprazine  6 mg Oral Daily   chlorhexidine  15 mL Mouth Rinse BID   Chlorhexidine Gluconate Cloth  6 each Topical Daily   cholestyramine light  4 g Oral 2 times per day   doxepin  50 mg Oral QHS   feeding supplement  237 mL Oral BID BM   folic acid  1 mg Oral Daily   gabapentin  300 mg Oral TID   heparin  5,000 Units Subcutaneous Q8H   mouth rinse  15 mL Mouth Rinse q12n4p   melatonin  3 mg Oral QHS   metoprolol tartrate  50 mg Oral BID   multivitamin with minerals  1 tablet Oral Daily   nutrition supplement (JUVEN)  1 packet Oral BID BM   pantoprazole  40 mg Oral Daily   tapentadol  200 mg Oral TID    thiamine  100 mg Oral Daily   vortioxetine HBr  20 mg Oral Daily   Continuous Infusions:  sodium chloride 10 mL/hr at 08/01/21 0600   PRN Meds:.sodium chloride, docusate sodium, Gerhardt's butt cream, hydrALAZINE, labetalol, loperamide, LORazepam, ondansetron (ZOFRAN) IV, polyethylene glycol, traZODone      Subjective:   Holly Roberts was seen and examined today.  Currently improving, tolerating diet, still feels weak not at baseline, hoping for skilled nursing facility authorization. Patient denies dizziness, chest pain, shortness of breath, new weakness, numbess, tingling. No acute events overnight.    Objective:   Vitals:   08/08/21 0445 08/08/21 1205 08/08/21 2031 08/09/21 0413  BP:  126/78 117/77 126/79  Pulse:  75 81 66  Resp:  20 18 17   Temp:  98.9 F (37.2  C) 97.9 F (36.6 C) 97.9 F (36.6 C)  TempSrc:  Oral Oral Oral  SpO2:  93% 93% 97%  Weight: 117 kg     Height:        Intake/Output Summary (Last 24 hours) at 08/09/2021 1223 Last data filed at 08/09/2021 0730 Gross per 24 hour  Intake 700 ml  Output 1 ml  Net 699 ml     Wt Readings from Last 3 Encounters:  08/08/21 117 kg  07/24/21 122 kg  06/09/21 122 kg     Exam General: Alert and oriented x 3, NAD Cardiovascular: S1 S2 auscultated, no murmurs, RRR Respiratory: Clear to auscultation bilaterally, no wheezing Gastrointestinal: Soft, nontender, nondistended, + bowel sounds Ext: no pedal edema bilaterally Neuro: no new deficits bilaterally Skin: No rashes Psych: Normal affect and demeanor, alert and oriented x3    Data Reviewed:  I have personally reviewed following labs and imaging studies  Micro Results Recent Results (from the past 240 hour(s))  Urine Culture     Status: None   Collection Time: 07/30/21  2:50 PM   Specimen: In/Out Cath Urine  Result Value Ref Range Status   Specimen Description   Final    IN/OUT CATH URINE Performed at [facility], [address], [place], [place] [address]    Special Requests   Final    NONE Performed at [facility], [address], [place], [place] [address]    Culture   Final    NO GROWTH Performed at [facility], [address] [place], [place] [address]    Report Status 07/31/2021 FINAL  Final  C Difficile Quick Screen (NO PCR Reflex)     Status: None   Collection Time: 08/01/21  4:20 PM   Specimen: STOOL  Result Value Ref Range Status   C Diff antigen NEGATIVE NEGATIVE Final   C Diff toxin NEGATIVE NEGATIVE Final   C Diff interpretation No C. difficile detected.  Final    Comment: Performed at [facility], [address], [place], [place] [address]    Radiology Reports CT ABDOMEN PELVIS WO CONTRAST  Result Date: 07/30/2021 CLINICAL DATA:  Abdominal pain and elevated lipase, unremarkable CT earlier in the same day. EXAM: CT ABDOMEN AND PELVIS WITHOUT CONTRAST TECHNIQUE: Multidetector CT imaging of the abdomen and pelvis was performed following the standard protocol without IV contrast. Examination is significantly limited by patient motion artifact. COMPARISON:  07/30/2021 FINDINGS: Lower chest: No acute abnormality is noted in the bases bilaterally. Hepatobiliary: No focal liver abnormality is seen. Status post cholecystectomy. No biliary dilatation. Pancreas: Pancreas shows no mass lesion or ductal obstruction. Some very minimal peripancreatic inflammatory changes are seen which may represent some mild acute pancreatitis. These changes have progressed slightly in the interval from the prior examination this morning. Spleen: Normal in size without focal abnormality. Adrenals/Urinary Tract: Adrenal glands are within normal limits. Kidneys show no renal calculi. Some residual contrast is seen within the right collecting system as well as the bladder. 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 Stomach/Bowel: Colon shows no obstructive or  inflammatory changes. The appendix is not well seen although no inflammatory changes are seen. Small bowel and stomach are within normal limits. Vascular/Lymphatic: Aortic atherosclerosis. No enlarged abdominal or pelvic lymph nodes. Reproductive: Uterus and bilateral adnexa are unremarkable. Other: No abdominal wall hernia or abnormality. No abdominopelvic ascites. Musculoskeletal: No acute or significant osseous findings. IMPRESSION: Very  minimal peripancreatic inflammatory change near the head of the pancreas. No significant phlegmon is noted. These changes are likely related to very mild acute appendicitis. Chronic changes similar to that seen on the prior exam. Examination is somewhat limited due to motion artifact. Electronically Signed   By: Alcide Clever M.D.   On: 07/30/2021 17:06   CT ABDOMEN PELVIS WO CONTRAST  Result Date: 07/24/2021 CLINICAL DATA:  55 year old female with abdominal pain, diarrhea and vomiting since yesterday. Recent renal insufficiency. EXAM: CT ABDOMEN AND PELVIS WITHOUT CONTRAST TECHNIQUE: Multidetector CT imaging of the abdomen and pelvis was performed following the standard protocol without IV contrast. COMPARISON:  CT Abdomen and Pelvis 06/09/2021, 04/19/2021. FINDINGS: Lower chest: Negative.  No pericardial or pleural effusion. Hepatobiliary: Absent gallbladder.  Negative noncontrast liver. Pancreas: Negative. Spleen: Negative. Adrenals/Urinary Tract: Adrenal glands remain within normal limits. 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 No nephrolithiasis, hydronephrosis, or acute renal finding. Negative ureters, bladder. Stomach/Bowel: The cecum is on a lax mesentery located in the right upper abdomen. Appendix appears to be diminutive or absent. Decompressed terminal ileum. No dilated small bowel. Stomach and duodenum appear within normal limits. No convincing bowel inflammation. No free air or free fluid. Vascular/Lymphatic: Normal caliber  abdominal aorta. Minimal calcified atherosclerosis. No lymphadenopathy. Reproductive: Negative noncontrast appearance. Other: No pelvic free fluid. Musculoskeletal: Grade 2 versus grade 3 spondylolisthesis at the lumbosacral junction with chronic L5 pars fractures and spinal stenosis appears stable. No acute osseous abnormality identified. IMPRESSION: 1. No acute or inflammatory process identified in the non-contrast abdomen or pelvis. 2. 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 Electronically Signed   By: Odessa Fleming M.D.   On: 07/24/2021 10:07   CT HEAD WO CONTRAST ( )  Result Date: 07/30/2021 CLINICAL DATA:  55 year old female with nausea, decreased p.o. Delirium, combative. EXAM: CT HEAD WITHOUT CONTRAST TECHNIQUE: Contiguous axial images were obtained from the base of the skull through the vertex without intravenous contrast. COMPARISON:  None. FINDINGS: Brain: Chronic encephalomalacia right inferior occipital lobe. Associated mild ex vacuo enlargement of the right occipital horn. Elsewhere cerebral volume is preserved, gray-white matter differentiation within normal limits. No superimposed No midline shift, ventriculomegaly, mass effect, evidence of mass lesion, intracranial hemorrhage or evidence of cortically based acute infarction. Vascular: Calcified atherosclerosis at the skull base. No suspicious intracranial vascular hyperdensity. Skull: Negative. Sinuses/Orbits: Visualized paranasal sinuses and mastoids are clear. Other: Visualized orbits and scalp soft tissues are within normal limits. IMPRESSION: Chronic Right PCA infarct.  No acute intracranial abnormality. Electronically Signed   By: Odessa Fleming M.D.   On: 07/30/2021 06:17   CT Angio Chest Pulmonary Embolism (PE) W or WO Contrast  Result Date: 07/30/2021 CLINICAL DATA:  Nausea.  Concern pulmonary embolism. EXAM: CT ANGIOGRAPHY CHEST  WITH CONTRAST TECHNIQUE: Multidetector CT imaging of the chest was performed using the standard protocol during bolus administration of intravenous contrast. Multiplanar CT image reconstructions and MIPs were obtained to evaluate the vascular anatomy. CONTRAST:  80mL OMNIPAQUE IOHEXOL 350 MG/ML SOLN COMPARISON:  None. FINDINGS: Cardiovascular: No filling defects within the pulmonary arteries to suggest acute pulmonary embolism. Mediastinum/Nodes: No axillary or supraclavicular adenopathy. No mediastinal or hilar adenopathy. No pericardial fluid. Esophagus normal. Lungs/Pleura: No pulmonary infarction. No pneumonia. No pleural fluid. Upper Abdomen: Low-attenuation liver suggests hepatic steatosis. Musculoskeletal: Review of the MIP images confirms the above findings. IMPRESSION: 1. No evidence of pulmonary embolism.  2. No evidence of pneumonia or pulmonary infarction. 3. Hepatic steatosis. Electronically Signed   By: Genevive Bi M.D.   On: 07/30/2021 06:20   CT ABDOMEN PELVIS W CONTRAST  Result Date: 07/30/2021 CLINICAL DATA:  Abdominal pain, acute, nonlocalized EXAM: CT ABDOMEN AND PELVIS WITH CONTRAST TECHNIQUE: Multidetector CT imaging of the abdomen and pelvis was performed using the standard protocol following bolus administration of intravenous contrast. CONTRAST:  80mL OMNIPAQUE IOHEXOL 350 MG/ML SOLN COMPARISON:  None. FINDINGS: Patient movement degrades imaging Lower chest: Lung bases are clear. Hepatobiliary: No focal hepatic lesion. Postcholecystectomy. No biliary dilatation. Low-attenuation liver suggests hepatic steatosis. Some streak artifact patient's arms at side. Pancreas: Pancreas is normal. No ductal dilatation. No pancreatic inflammation. Spleen: Normal spleen Adrenals/urinary tract: Adrenal glands are normal. No hydronephrosis. Fat density lesion lower pole of the LEFT kidney measures 8 mm (image 44/2). Ureters and bladder normal. Stomach/Bowel: Small hiatal hernia. Stomach, small bowel,  appendix, and cecum are normal. The colon and rectosigmoid colon are normal. Vascular/Lymphatic: Abdominal aorta is normal caliber. No periportal or retroperitoneal adenopathy. No pelvic adenopathy. Reproductive: Uterus and adnexa unremarkable. Other: No free fluid. 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 No change from prior. IMPRESSION: 1. No acute findings in the abdomen pelvis. 2. No evidence of bowel obstruction. 3. Postcholecystectomy without complication. 4. Probable hepatic steatosis. 5. Small RIGHT renal benign angiomyolipoma. Electronically Signed   By: Genevive Bi M.D.   On: 07/30/2021 06:14   DG Chest Port 1 View  Result Date: 07/30/2021 CLINICAL DATA:  Nausea, questionable sepsis EXAM: PORTABLE CHEST 1 VIEW COMPARISON:  04/19/2021 FINDINGS: Low lung volumes. Lungs are clear.  No pleural effusion or pneumothorax. The heart is normal in size. IMPRESSION: No evidence of acute cardiopulmonary disease. Electronically Signed   By: Charline Bills M.D.   On: 07/30/2021 03:22    Lab Data:  CBC: No results for input(s): WBC, NEUTROABS, HGB, HCT, MCV, PLT in the last 168 hours. Basic Metabolic Panel: Recent Labs  Lab 08/03/21 0753 08/04/21 0525 08/09/21 0527  NA 145 141 139  K 3.2* 3.6 4.2  CL 101 104 103  CO2 30 30 29   GLUCOSE 89 88 97  BUN 7 6 16   CREATININE 0.71 0.63 0.81  CALCIUM 9.8 8.8* 9.3  MG 2.4 2.1 2.3  PHOS 3.8 4.4 4.7*   GFR: Estimated Creatinine Clearance: 98.6 mL/min (by C-G formula based on SCr of 0.81 mg/dL). Liver Function Tests: No results for input(s): AST, ALT, ALKPHOS, BILITOT, PROT, ALBUMIN in the last 168 hours. No results for input(s): LIPASE, AMYLASE in the last 168 hours. No results for input(s): AMMONIA in the last 168 hours. Coagulation Profile: No results for input(s): INR, PROTIME in the last 168 hours. Cardiac Enzymes: No results for input(s): CKTOTAL, CKMB, CKMBINDEX, TROPONINI in the last 168 hours. BNP  (last 3 results) No results for input(s): PROBNP in the last 8760 hours. HbA1C: No results for input(s): HGBA1C in the last 72 hours. CBG: Recent Labs  Lab 08/04/21 0732 08/04/21 1142 08/05/21 1121 08/05/21 1628  GLUCAP 88 115* 111* 123*   Lipid Profile: No results for input(s): CHOL, HDL, LDLCALC, TRIG, CHOLHDL, LDLDIRECT in the last 72 hours. Thyroid Function Tests: No results for input(s): TSH, T4TOTAL, FREET4, T3FREE, THYROIDAB in the last 72 hours. Anemia Panel: No results for input(s): VITAMINB12, FOLATE, FERRITIN, TIBC, IRON, RETICCTPCT in the last 72 hours. Urine analysis:    Component Value Date/Time   COLORURINE YELLOW 07/30/2021 1450  APPEARANCEUR CLEAR 07/30/2021 1450   LABSPEC 1.020 07/30/2021 1450   PHURINE 6.0 07/30/2021 1450   GLUCOSEU NEGATIVE 07/30/2021 1450   HGBUR MODERATE (A) 07/30/2021 1450   BILIRUBINUR NEGATIVE 07/30/2021 1450   KETONESUR >80 (A) 07/30/2021 1450   PROTEINUR TRACE (A) 07/30/2021 1450   NITRITE NEGATIVE 07/30/2021 1450   LEUKOCYTESUR NEGATIVE 07/30/2021 1450     Lamount Bankson M.D. [facility] 08/09/2021, 12:23 PM  Available via Epic secure chat 7am-7pm After 7 pm, please refer to night coverage provider listed on amion.

## 2021-08-09 NOTE — Progress Notes (Signed)
Physical Therapy Treatment Patient Details Name: Holly Roberts MRN: [id number] DOB: 1966-11-07 Today's Date: 08/09/2021    History of Present Illness Patient is a 55 year old female who presented to the hosptial on 8/13 with confusion, decreased oral intake, and nausea. patient was found to have acute toxic metabolic encephalopathy,Severe anion gap metabolic acidosis, acute renal failure, and refeeding syndrome. PMH: Pipolar,HTN, and GERD    PT Comments    Pt is progressing toward acute PT goals this session with increased overall ambulation distance with MIN guard-supervision, no overt LOB observed. Pt remains below baseline mobility level and continues to demonstrate decreased activity tolerance requiring use of assistive device to maintain safety and multiple seated rest breaks for recovery throughout session. HR max 113bpm, O2 93-95% during mobility. Pt will benefit from continued skilled PT to increase their independence and maximize safety with mobility.     Follow Up Recommendations  SNF (pt's daughter/pt discussed plan and pt is ready to pursue SNF for ST rehab)     Equipment Recommendations  Rolling walker with 5" wheels;3in1 (PT) (TBA, pt may progress and not require DME)    Recommendations for Other Services       Precautions / Restrictions Precautions Precautions: Fall Precaution Comments: monitor BP and HR Restrictions Weight Bearing Restrictions: No    Mobility  Bed Mobility Overal bed mobility: Needs Assistance Bed Mobility: Supine to Sit     Supine to sit: Supervision     General bed mobility comments: supervision with increased time, pt reported lying flat in bed all day for comfort. PT educated pt on benefits to upright tolerance and continued OOB for meals, pt verbalized understanding.    Transfers Overall transfer level: Needs assistance Equipment used: Rolling walker (2 wheeled) Transfers: Sit to/from Stand Sit to Stand: Min guard;From elevated  surface;Supervision         General transfer comment: x 6 total from EOB, toilet, and recliner (following seated rest breaks). MIN guard progressing to supervision for safety from recliner and EOB, pt using bil UE's for power up and reach back to control lowering.  Ambulation/Gait Ambulation/Gait assistance: Min guard;Supervision Gait Distance (Feet): 20 Feet ((additional 8ft, 57ft, 49ft)) Assistive device: Rolling walker (2 wheeled) Gait Pattern/deviations: Step-through pattern;Decreased stride length;Shuffle Gait velocity: decr   General Gait Details: MIN guard-supervision for safety with cues to maintain close proximityi to RW to maintains afety, pt amb to bathroom and then required seated rest prior to ambulation in hallway. Pt ambulated additional 20', 42', 54', and 65', no overt LOB noted. Pt easily fatigued and required multiple prolonged seated rest breaks between bouts.   Stairs             Wheelchair Mobility    Modified Rankin (Stroke Patients Only)       Balance Overall balance assessment: Needs assistance Sitting-balance support: No upper extremity supported;Feet supported Sitting balance-Leahy Scale: Good Sitting balance - Comments: pt able to perform multidirectional weight shifts for performance of pericare following toileting   Standing balance support: Bilateral upper extremity supported;During functional activity Standing balance-Leahy Scale: Fair Standing balance comment: use of RW                            Cognition Arousal/Alertness: Awake/alert Behavior During Therapy: WFL for tasks assessed/performed Overall Cognitive Status: Within Functional Limits for tasks assessed  General Comments: Pt initially anxious when sharing with PT about her medical issues and hospital course stating "I just do not want that to happen again, I dont want to end up back in the ICU"      Exercises       General Comments        Pertinent Vitals/Pain Pain Assessment: 0-10 Pain Score: 4  Pain Location: Lt shoulder Pain Descriptors / Indicators: Sore Pain Intervention(s): Limited activity within patient's tolerance;Monitored during session;Repositioned (heat pack on upon entry)    Home Living                      Prior Function            PT Goals (current goals can now be found in the care plan section) Acute Rehab PT Goals Patient Stated Goal: to go home today PT Goal Formulation: With patient Time For Goal Achievement: 08/17/21 Potential to Achieve Goals: Good Progress towards PT goals: Progressing toward goals    Frequency    Min 3X/week      PT Plan Current plan remains appropriate    Co-evaluation              AM-PAC PT "6 Clicks" Mobility   Outcome Measure  Help needed turning from your back to your side while in a flat bed without using bedrails?: None Help needed moving from lying on your back to sitting on the side of a flat bed without using bedrails?: None Help needed moving to and from a bed to a chair (including a wheelchair)?: A Little Help needed standing up from a chair using your arms (e.g., wheelchair or bedside chair)?: A Little Help needed to walk in hospital room?: A Little Help needed climbing 3-5 steps with a railing? : A Lot 6 Click Score: 19    End of Session Equipment Utilized During Treatment: Gait belt Activity Tolerance: Patient tolerated treatment well Patient left: in chair;with call bell/phone within reach;with chair alarm set;with nursing/sitter in room Nurse Communication: Mobility status PT Visit Diagnosis: Muscle weakness (generalized) (M62.81);Difficulty in walking, not elsewhere classified (R26.2);Unsteadiness on feet (R26.81)     Time: 0981-1914 PT Time Calculation (min) (ACUTE ONLY): 38 min  Charges:  $Therapeutic Activity: 38-52 mins                     Lyman Speller PT, DPT  Acute Rehabilitation  Services  Office [phone]   08/09/2021, 4:09 PM

## 2021-08-10 LAB — RESP PANEL BY RT-PCR (FLU A&B, COVID) ARPGX2
Influenza A by PCR: NEGATIVE
Influenza B by PCR: NEGATIVE
SARS Coronavirus 2 by RT PCR: NEGATIVE

## 2021-08-10 MED ORDER — DOXEPIN HCL 50 MG PO CAPS: 50.0000 mg | ORAL_CAPSULE | Freq: Every day | ORAL | Status: DC

## 2021-08-10 MED ORDER — MELATONIN 3 MG PO TABS: 3.0000 mg | ORAL_TABLET | Freq: Every day | ORAL | 0 refills | Status: DC

## 2021-08-10 MED ORDER — ADULT MULTIVITAMIN W/MINERALS CH: 1.0000 | ORAL_TABLET | Freq: Every day | ORAL | Status: AC

## 2021-08-10 MED ORDER — FOLIC ACID 1 MG PO TABS: 1.0000 mg | ORAL_TABLET | Freq: Every day | ORAL | Status: DC

## 2021-08-10 MED ORDER — COVID-19 MRNA VACC (MODERNA) 50 MCG/0.25ML IM SUSP
0.2500 mL | Freq: Once | INTRAMUSCULAR | Status: AC
  Administered 2021-08-10: 0.25 mL via INTRAMUSCULAR
  Filled 2021-08-10: qty 0.25

## 2021-08-10 MED ORDER — PANTOPRAZOLE SODIUM 40 MG PO TBEC: 40.0000 mg | DELAYED_RELEASE_TABLET | Freq: Every day | ORAL | Status: DC

## 2021-08-10 MED ORDER — ONDANSETRON HCL 4 MG PO TABS: 4.0000 mg | ORAL_TABLET | Freq: Three times a day (TID) | ORAL | 0 refills | Status: DC | PRN

## 2021-08-10 MED ORDER — ONDANSETRON HCL 4 MG PO TABS: 4.0000 mg | ORAL_TABLET | Freq: Four times a day (QID) | ORAL | 0 refills | Status: DC

## 2021-08-10 MED ORDER — LOPERAMIDE HCL 2 MG PO CAPS: 2.0000 mg | ORAL_CAPSULE | Freq: Four times a day (QID) | ORAL | 0 refills | Status: DC | PRN

## 2021-08-10 MED ORDER — GABAPENTIN 300 MG PO CAPS: 300.0000 mg | ORAL_CAPSULE | Freq: Three times a day (TID) | ORAL | Status: DC

## 2021-08-10 MED ORDER — AMLODIPINE BESYLATE 10 MG PO TABS: 10.0000 mg | ORAL_TABLET | Freq: Every day | ORAL | Status: DC

## 2021-08-10 MED ORDER — CHOLESTYRAMINE LIGHT 4 G PO PACK: 4.0000 g | PACK | Freq: Two times a day (BID) | ORAL | Status: DC

## 2021-08-10 MED ORDER — METOPROLOL TARTRATE 50 MG PO TABS: 50.0000 mg | ORAL_TABLET | Freq: Two times a day (BID) | ORAL | Status: DC

## 2021-08-10 MED ORDER — THIAMINE HCL 100 MG PO TABS: 100.0000 mg | ORAL_TABLET | Freq: Every day | ORAL | Status: DC

## 2021-08-10 NOTE — Progress Notes (Signed)
Pt's education was provided. RN discussed AVS with pt and verbalized understanding. Pt was discharged at 2135

## 2021-08-10 NOTE — TOC Transition Note (Signed)
Transition of Care [facility]) - CM/SW Discharge Note   Patient Details  Name: Quinteria Chisum MRN: [id number] Date of Birth: Aug 22, 1966  [facility] of [facility]) CM/SW Contact:  Ida Rogue, LCSW Phone Number: 08/10/2021, 2:30 PM   Clinical Narrative:   Patient who is stable for d/c will transition to [facility] today.  Family informed.  PTAR arranged.  Nursing, please call [phone], room 116 for report. TOC sign off.    Final next level of care: Skilled Nursing Facility Barriers to Discharge: Barriers Resolved   Patient Goals and CMS Choice Patient states their goals for this hospitalization and ongoing recovery are:: "I need to do this for my kids."   Choice offered to / list presented to : Patient  Discharge Placement                       Discharge Plan and Services   Discharge Planning Services: CM Consult Post Acute Care Choice: Skilled Nursing Facility                               Social Determinants of Health (SDOH) Interventions     Readmission Risk Interventions No flowsheet data found.

## 2021-08-10 NOTE — Plan of Care (Signed)
  Problem: Education: Goal: Knowledge of General Education information will improve Description: Including pain rating scale, medication(s)/side effects and non-pharmacologic comfort measures Outcome: Progressing   Problem: Clinical Measurements: Goal: Will remain free from infection Outcome: Progressing   Problem: Coping: Goal: Level of anxiety will decrease Outcome: Not Progressing   Problem: Safety: Goal: Non-violent Restraint(s) Outcome: Completed/Met  Patient no longer requiring restraints for safetly

## 2021-08-10 NOTE — Discharge Summary (Addendum)
Physician Discharge Summary   Patient ID: Holly Roberts MRN: [id number] DOB/AGE: [birth year] 55 y.o.  Admit date: 07/29/2021 Discharge date: 08/10/2021  Primary Care Physician: Obtains care in the [facility]   Recommendations for Outpatient Follow-up:  Follow up with PCP in 1-2 weeks Recommend outpatient GI follow-up/referral if symptoms return Wean down cholestyramine to daily dosing in a week.  Home Health: Patient going to skilled nursing facility Equipment/Devices:   Discharge Condition: stable CODE STATUS: FULL  Diet recommendation: Heart healthy diet, low-fat   Discharge Diagnoses:     Acute metabolic encephalopathy  Pressure injury of skin  Severe anion gap metabolic acidosis   Starvation ketosis  Bile acid malabsorption syndrome  AKI (acute kidney injury) (HCC)  Bipolar disorder (HCC)  Depression  Anxiety  Chronic pain  Vomiting and diarrhea  Moderate protein calorie malnutrition  Benign essential HTN  Pressure injury of skin  Consults: None    Allergies:   Allergies  Allergen Reactions   Buprenorphine Rash    Patch gave rash- poss adhesive issues. Tried generic and brand name both. Patient states: "I am not allergic to the medication. I am allergic to the adhesive, not the medication."    Amoxicillin-Pot Clavulanate     Other reaction(s): Other (See Comments) States "strong" antibiotics cause C diff   Wound Dressing Adhesive     Rash   Sulfa Antibiotics Rash     DISCHARGE MEDICATIONS: Allergies as of 08/10/2021       Reactions   Buprenorphine Rash   Patch gave rash- poss adhesive issues. Tried generic and brand name both. Patient states: "I am not allergic to the medication. I am allergic to the adhesive, not the medication."    Amoxicillin-pot Clavulanate    Other reaction(s): Other (See Comments) States "strong" antibiotics cause C diff   Wound Dressing Adhesive    Rash   Sulfa Antibiotics Rash        Medication List     TAKE  these medications    amLODipine 10 MG tablet Commonly known as: NORVASC Take 1 tablet (10 mg total) by mouth daily. Start taking on: August 11, 2021   Armodafinil 250 MG tablet Take 250 mg by mouth every morning.   aspirin-acetaminophen-caffeine 250-250-65 MG tablet Commonly known as: EXCEDRIN MIGRAINE Take 1 tablet by mouth every 6 (six) hours as needed for headache.   cholestyramine light 4 g packet Commonly known as: PREVALITE Take 1 packet (4 g total) by mouth 2 (two) times daily.   doxepin 50 MG capsule Commonly known as: SINEQUAN Take 1 capsule (50 mg total) by mouth at bedtime. What changed:  medication strength how much to take   folic acid 1 MG tablet Commonly known as: FOLVITE Take 1 tablet (1 mg total) by mouth daily. Start taking on: August 11, 2021   Horizant 600 MG Tbcr Generic drug: Gabapentin Enacarbil Take 600-1,200 mg by mouth in the morning and at bedtime. Take 1 tablet (600 mg) in the morning and Take 2 tablets (1200 mg) at bedtime   loperamide 2 MG capsule Commonly known as: IMODIUM Take 1 capsule (2 mg total) by mouth every 6 (six) hours as needed for diarrhea or loose stools.   melatonin 3 MG Tabs tablet Take 1 tablet (3 mg total) by mouth at bedtime.   metoprolol tartrate 50 MG tablet Commonly known as: LOPRESSOR Take 1 tablet (50 mg total) by mouth 2 (two) times daily.   multivitamin with minerals Tabs tablet Take 1 tablet by mouth  daily. Start taking on: August 11, 2021   Nucynta ER 200 MG Tb12 Generic drug: Tapentadol HCl Take 200 mg by mouth 2 (two) times daily.   Nucynta 100 MG Tabs Generic drug: Tapentadol HCl Take 200 mg by mouth daily in the afternoon.   ondansetron 4 MG tablet Commonly known as: ZOFRAN Take 1 tablet (4 mg total) by mouth every 8 (eight) hours as needed for nausea or vomiting. What changed:  when to take this reasons to take this   pantoprazole 40 MG tablet Commonly known as: PROTONIX Take 1 tablet (40  mg total) by mouth daily. Start taking on: August 11, 2021   thiamine 100 MG tablet Take 1 tablet (100 mg total) by mouth daily. Start taking on: August 11, 2021   Trintellix 20 MG Tabs tablet Generic drug: vortioxetine HBr Take 20 mg by mouth daily.   Vraylar 6 MG Caps Generic drug: Cariprazine HCl Take 6 mg by mouth daily.               Discharge Care Instructions  (From admission, onward)           Start     Ordered   08/10/21 0000  Discharge wound care:       Comments: Wound care per nursing for pressure injury sacrum mid stage II.   08/10/21 1149             Brief H and P: For complete details please refer to admission H and P, but in brief Patient is a 55 year old female with history of bipolar disorder, anxiety, depression, chronic pain syndrome, obesity presented to ED with nausea, decreased oral intake, confusion.  Patient daughter had reported poor oral intake to include liquids/solids following her laparoscopic cholecystectomy. Originally admitted on 5/5 for acute cholecystitis, underwent lap chole on 5/6 with postoperative course complicated by postcholecystectomy diarrhea and was started on cholestyramine.  Patient was readmitted on 06/09/2021 with 4-day history of vomiting/diarrhea and poor oral intake, lipase of 263, UTI, AKI with high AGMA, hyperglycemia, hyperbilirubinemia, resolved with IV fluid therapy and subsequently discharged on 6/25.  Patient then presented on 8/8 with emesis, abdominal pain, diarrhea, anion gap 16, symptoms resolved with IV fluids.   In ED, patient was noted to be tachycardiac, afebrile, AGMA, slightly elevated ammonia level.  Normal lactic acid, normal Tylenol level, normal kidney function. Initially admitted to [facility] for severe anion gap metabolic acidosis, associated nausea and vomiting secondary to starvation ketosis.  Patient [facility] on 8/17  Hospital Course:  Severe anion gap metabolic acidosis, starvation  ketosis Nausea/vomiting/diarrhea secondary to bile acid malabsorption syndrome -Initially presented with confusion, found to have high AGMA, nausea and vomiting in the setting of poor oral intake.  Multiple ED visits for the same. -Anion gap 28 at the time of admission, patient was placed on IV fluid hydration, antiemetics, cholestyramine -Patient was placed on IV fluid hydration, antiemetics, currently tolerating solid diet -Continue cholestyramine BID (wean in a week to daily).  Per patient, her symptoms had returned in the past due to stopping it.   Acute metabolic encephalopathy, POA -Likely due to acute kidney injury, starvation ketosis and metabolic acidosis. 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 -CTA chest negative for PE, pneumonia or pulmonary infarction.  CT abdomen pelvis with mild acute pancreatitis -Currently alert and oriented x4, back to her baseline   Acute kidney injury -Creatinine 1.26 on admission secondary to prerenal azotemia, dehydration -Patient was placed on IV  fluid hydration. -Renal function has normalized, creatinine 0.8 at the time of discharge.   Refeeding syndrome, moderate protein calorie malnutrition -Poor oral intake since cholecystectomy on 5/6.  Multiple hospitalization with nausea/vomiting, poor oral intake -Currently tolerating diet without any difficulty.   Bipolar disorder, anxiety/depression -Currently stable, continue Vraylar, Trintellix, doxepin at reduced dose 50 mg p.o. nightly   GERD -Continue Protonix 40 mg daily   Essential hypertension -BP stable, continue metoprolol 50 mg twice a day, amlodipine 10 mg daily  Chronic pain syndrome -Patient follows pain specialist in [facility], continue regimen.  Records in care everywhere reviewed.   Pressure Injury Documentation: Sacrum mid stage II, POA -Wound care per nursing   Obesity, generalized debility Estimated body mass index is 44.27  kg/m as calculated from the following:   Height as of this encounter: 5\' 4"  (1.626 m).   Weight as of this encounter: 117 kg.   Day of Discharge S: No acute complaints, tolerating diet, had a BM overnight.  No further diarrhea.  BP 113/72 (BP Location: Right Wrist)   Pulse 69   Temp 97.6 F (36.4 C)   Resp 18   Ht 5\' 4"  (1.626 m)   Wt 113.9 kg   SpO2 91%   BMI 43.10 kg/m   Physical Exam: General: Alert and awake oriented x3 not in any acute distress. CVS: S1-S2 clear no murmur rubs or gallops Chest: clear to auscultation bilaterally, no wheezing rales or rhonchi Abdomen: soft nontender, nondistended, normal bowel sounds Extremities: no cyanosis, clubbing or edema noted bilaterally Neuro: no new deficits    Get Medicines reviewed and adjusted: Please take all your medications with you for your next visit with your Primary MD  Please request your Primary MD to go over all hospital tests and procedure/radiological results at the follow up. Please ask your Primary MD to get all Hospital records sent to his/her office.  If you experience worsening of your admission symptoms, develop shortness of breath, life threatening emergency, suicidal or homicidal thoughts you must seek medical attention immediately by calling 911 or calling your MD immediately  if symptoms less severe.  You must read complete instructions/literature along with all the possible adverse reactions/side effects for all the Medicines you take and that have been prescribed to you. Take any new Medicines after you have completely understood and accept all the possible adverse reactions/side effects.   Do not drive when taking pain medications.   Do not take more than prescribed Pain, Sleep and Anxiety Medications  Special Instructions: If you have smoked or chewed Tobacco  in the last 2 yrs please stop smoking, stop any regular Alcohol  and or any Recreational drug use.  Wear Seat belts while driving.  Please  note  You were cared for by a hospitalist during your hospital stay. Once you are discharged, your primary care physician will handle any further medical issues. Please note that NO REFILLS for any discharge medications will be authorized once you are discharged, as it is imperative that you return to your primary care physician (or establish a relationship with a primary care physician if you do not have one) for your aftercare needs so that they can reassess your need for medications and monitor your lab values.   The results of significant diagnostics from this hospitalization (including imaging, microbiology, ancillary and laboratory) are listed below for reference.      Procedures/Studies:  CT ABDOMEN PELVIS WO CONTRAST  Result Date: 07/30/2021 CLINICAL DATA:  Abdominal  pain and elevated lipase, unremarkable CT earlier in the same day. EXAM: CT ABDOMEN AND PELVIS WITHOUT CONTRAST TECHNIQUE: Multidetector CT imaging of the abdomen and pelvis was performed following the standard protocol without IV contrast. Examination is significantly limited by patient motion artifact. COMPARISON:  07/30/2021 FINDINGS: Lower chest: No acute abnormality is noted in the bases bilaterally. Hepatobiliary: No focal liver abnormality is seen. Status post cholecystectomy. No biliary dilatation. Pancreas: Pancreas shows no mass lesion or ductal obstruction. Some very minimal peripancreatic inflammatory changes are seen which may represent some mild acute pancreatitis. These changes have progressed slightly in the interval from the prior examination this morning. Spleen: Normal in size without focal abnormality. Adrenals/Urinary Tract: Adrenal glands are within normal limits. Kidneys show no renal calculi. Some residual contrast is seen within the right collecting system as well as the bladder. 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 Stomach/Bowel: Colon shows no obstructive or inflammatory  changes. The appendix is not well seen although no inflammatory changes are seen. Small bowel and stomach are within normal limits. Vascular/Lymphatic: Aortic atherosclerosis. No enlarged abdominal or pelvic lymph nodes. Reproductive: Uterus and bilateral adnexa are unremarkable. Other: No abdominal wall hernia or abnormality. No abdominopelvic ascites. Musculoskeletal: No acute or significant osseous findings. IMPRESSION: Very minimal peripancreatic inflammatory change near the head of the pancreas. No significant phlegmon is noted. These changes are likely related to very mild acute appendicitis. Chronic changes similar to that seen on the prior exam. Examination is somewhat limited due to motion artifact. Electronically Signed   By: Alcide Clever M.D.   On: 07/30/2021 17:06   CT ABDOMEN PELVIS WO CONTRAST  Result Date: 07/24/2021 CLINICAL DATA:  55 year old female with abdominal pain, diarrhea and vomiting since yesterday. Recent renal insufficiency. EXAM: CT ABDOMEN AND PELVIS WITHOUT CONTRAST TECHNIQUE: Multidetector CT imaging of the abdomen and pelvis was performed following the standard protocol without IV contrast. COMPARISON:  CT Abdomen and Pelvis 06/09/2021, 04/19/2021. FINDINGS: Lower chest: Negative.  No pericardial or pleural effusion. Hepatobiliary: Absent gallbladder.  Negative noncontrast liver. Pancreas: Negative. Spleen: Negative. Adrenals/Urinary Tract: Adrenal glands remain within normal limits. 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 No nephrolithiasis, hydronephrosis, or acute renal finding. Negative ureters, bladder. Stomach/Bowel: The cecum is on a lax mesentery located in the right upper abdomen. Appendix appears to be diminutive or absent. Decompressed terminal ileum. No dilated small bowel. Stomach and duodenum appear within normal limits. No convincing bowel inflammation. No free air or free fluid. Vascular/Lymphatic: Normal caliber abdominal aorta.  Minimal calcified atherosclerosis. No lymphadenopathy. Reproductive: Negative noncontrast appearance. Other: No pelvic free fluid. Musculoskeletal: Grade 2 versus grade 3 spondylolisthesis at the lumbosacral junction with chronic L5 pars fractures and spinal stenosis appears stable. No acute osseous abnormality identified. IMPRESSION: 1. No acute or inflammatory process identified in the non-contrast abdomen or pelvis. 2. 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 Electronically Signed   By: Odessa Fleming M.D.   On: 07/24/2021 10:07   CT HEAD WO CONTRAST ( )  Result Date: 07/30/2021 CLINICAL DATA:  55 year old female with nausea, decreased p.o. Delirium, combative. EXAM: CT HEAD WITHOUT CONTRAST TECHNIQUE: Contiguous axial images were obtained from the base of the skull through the vertex without intravenous contrast. COMPARISON:  None. FINDINGS: Brain: Chronic encephalomalacia right inferior occipital lobe. Associated mild ex vacuo enlargement of the right occipital horn. Elsewhere cerebral volume is  preserved, gray-white matter differentiation within normal limits. No superimposed No midline shift, ventriculomegaly, mass effect, evidence of mass lesion, intracranial hemorrhage or evidence of cortically based acute infarction. Vascular: Calcified atherosclerosis at the skull base. No suspicious intracranial vascular hyperdensity. Skull: Negative. Sinuses/Orbits: Visualized paranasal sinuses and mastoids are clear. Other: Visualized orbits and scalp soft tissues are within normal limits. IMPRESSION: Chronic Right PCA infarct.  No acute intracranial abnormality. Electronically Signed   By: Odessa Fleming M.D.   On: 07/30/2021 06:17   CT Angio Chest Pulmonary Embolism (PE) W or WO Contrast  Result Date: 07/30/2021 CLINICAL DATA:  Nausea.  Concern pulmonary embolism. EXAM: CT ANGIOGRAPHY CHEST WITH CONTRAST  TECHNIQUE: Multidetector CT imaging of the chest was performed using the standard protocol during bolus administration of intravenous contrast. Multiplanar CT image reconstructions and MIPs were obtained to evaluate the vascular anatomy. CONTRAST:  80mL OMNIPAQUE IOHEXOL 350 MG/ML SOLN COMPARISON:  None. FINDINGS: Cardiovascular: No filling defects within the pulmonary arteries to suggest acute pulmonary embolism. Mediastinum/Nodes: No axillary or supraclavicular adenopathy. No mediastinal or hilar adenopathy. No pericardial fluid. Esophagus normal. Lungs/Pleura: No pulmonary infarction. No pneumonia. No pleural fluid. Upper Abdomen: Low-attenuation liver suggests hepatic steatosis. Musculoskeletal: Review of the MIP images confirms the above findings. IMPRESSION: 1. No evidence of pulmonary embolism. 2. No evidence of pneumonia or pulmonary infarction. 3. Hepatic steatosis. Electronically Signed   By: Genevive Bi M.D.   On: 07/30/2021 06:20   CT ABDOMEN PELVIS W CONTRAST  Result Date: 07/30/2021 CLINICAL DATA:  Abdominal pain, acute, nonlocalized EXAM: CT ABDOMEN AND PELVIS WITH CONTRAST TECHNIQUE: Multidetector CT imaging of the abdomen and pelvis was performed using the standard protocol following bolus administration of intravenous contrast. CONTRAST:  80mL OMNIPAQUE IOHEXOL 350 MG/ML SOLN COMPARISON:  None. FINDINGS: Patient movement degrades imaging Lower chest: Lung bases are clear. Hepatobiliary: No focal hepatic lesion. Postcholecystectomy. No biliary dilatation. Low-attenuation liver suggests hepatic steatosis. Some streak artifact patient's arms at side. Pancreas: Pancreas is normal. No ductal dilatation. No pancreatic inflammation. Spleen: Normal spleen Adrenals/urinary tract: Adrenal glands are normal. No hydronephrosis. Fat density lesion lower pole of the LEFT kidney measures 8 mm (image 44/2). Ureters and bladder normal. Stomach/Bowel: Small hiatal hernia. Stomach, small bowel, appendix, and  cecum are normal. The colon and rectosigmoid colon are normal. Vascular/Lymphatic: Abdominal aorta is normal caliber. No periportal or retroperitoneal adenopathy. No pelvic adenopathy. Reproductive: Uterus and adnexa unremarkable. Other: No free fluid. 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 No change from prior. IMPRESSION: 1. No acute findings in the abdomen pelvis. 2. No evidence of bowel obstruction. 3. Postcholecystectomy without complication. 4. Probable hepatic steatosis. 5. Small RIGHT renal benign angiomyolipoma. Electronically Signed   By: Genevive Bi M.D.   On: 07/30/2021 06:14   DG Chest Port 1 View  Result Date: 07/30/2021 CLINICAL DATA:  Nausea, questionable sepsis EXAM: PORTABLE CHEST 1 VIEW COMPARISON:  04/19/2021 FINDINGS: Low lung volumes. Lungs are clear.  No pleural effusion or pneumothorax. The heart is normal in size. IMPRESSION: No evidence of acute cardiopulmonary disease. Electronically Signed   By: Charline Bills M.D.   On: 07/30/2021 03:22      LAB RESULTS: Basic Metabolic Panel: Recent Labs  Lab 08/04/21 0525 08/09/21 0527  NA 141 139  K 3.6 4.2  CL 104 103  CO2 30 29  GLUCOSE 88 97  BUN 6 16  CREATININE 0.63 0.81  CALCIUM 8.8* 9.3  MG 2.1 2.3  PHOS 4.4 4.7*  Liver Function Tests: No results for input(s): AST, ALT, ALKPHOS, BILITOT, PROT, ALBUMIN in the last 168 hours. No results for input(s): LIPASE, AMYLASE in the last 168 hours. No results for input(s): AMMONIA in the last 168 hours. CBC: No results for input(s): WBC, NEUTROABS, HGB, HCT, MCV, PLT in the last 168 hours. Cardiac Enzymes: No results for input(s): CKTOTAL, CKMB, CKMBINDEX, TROPONINI in the last 168 hours. BNP: Invalid input(s): POCBNP CBG: Recent Labs  Lab 08/05/21 1121 08/05/21 1628  GLUCAP 111* 123*       Disposition and Follow-up: Discharge Instructions     Diet - low sodium heart healthy   Complete by: As directed     Discharge wound care:   Complete by: As directed    Wound care per nursing for pressure injury sacrum mid stage II.   Increase activity slowly   Complete by: As directed         DISPOSITION: Skilled nursing facility   DISCHARGE FOLLOW-UP  Contact information for after-discharge care     Destination     [facility] Preferred SNF .   Service: Skilled Nursing Contact information: [address] [place] [address] [phone]                      Time coordinating discharge:  35 minutes  Signed:   Thad Ranger M.D. [facility] 08/10/2021, 12:07 PM

## 2021-08-10 NOTE — Progress Notes (Signed)
SBAR report called and provided to [facility] LPN at [place] that will be receiving the patient. Family notified. Awaiting transport.

## 2021-08-10 NOTE — Plan of Care (Signed)

## 2021-08-11 ENCOUNTER — Other Ambulatory Visit: Payer: Self-pay | Admitting: Adult Health

## 2021-08-11 ENCOUNTER — Encounter: Payer: Self-pay | Admitting: Adult Health

## 2021-08-11 ENCOUNTER — Non-Acute Institutional Stay (SKILLED_NURSING_FACILITY): Payer: BC Managed Care – PPO | Admitting: Adult Health

## 2021-08-11 DIAGNOSIS — E878 Other disorders of electrolyte and fluid balance, not elsewhere classified: Secondary | ICD-10-CM | POA: Diagnosis not present

## 2021-08-11 DIAGNOSIS — N179 Acute kidney failure, unspecified: Secondary | ICD-10-CM | POA: Diagnosis not present

## 2021-08-11 DIAGNOSIS — G894 Chronic pain syndrome: Secondary | ICD-10-CM

## 2021-08-11 DIAGNOSIS — F419 Anxiety disorder, unspecified: Secondary | ICD-10-CM

## 2021-08-11 DIAGNOSIS — G2581 Restless legs syndrome: Secondary | ICD-10-CM

## 2021-08-11 DIAGNOSIS — K9089 Other intestinal malabsorption: Secondary | ICD-10-CM | POA: Diagnosis not present

## 2021-08-11 DIAGNOSIS — I10 Essential (primary) hypertension: Secondary | ICD-10-CM

## 2021-08-11 DIAGNOSIS — G9341 Metabolic encephalopathy: Secondary | ICD-10-CM | POA: Diagnosis not present

## 2021-08-11 DIAGNOSIS — F319 Bipolar disorder, unspecified: Secondary | ICD-10-CM

## 2021-08-11 MED ORDER — NUCYNTA ER 200 MG PO TB12: 200.0000 mg | ORAL_TABLET | Freq: Two times a day (BID) | ORAL | 0 refills | Status: DC

## 2021-08-11 MED ORDER — NUCYNTA 100 MG PO TABS: 200.0000 mg | ORAL_TABLET | Freq: Every day | ORAL | 0 refills | Status: AC

## 2021-08-11 NOTE — Progress Notes (Signed)
Location:  [facility] Room Number: 115-A Place of Service:  SNF (31) Provider:  Kenard Gower, DNP, FNP-BC  Patient Care Team: Pcp, No as PCP - General  Extended Emergency Contact Information Primary Emergency Contact: kaydra, borgen Mobile Phone: [phone] Relation: Daughter  Code Status: Full code   Goals of care: Advanced Directive information Advanced Directives 08/11/2021  Does Patient Have a Medical Advance Directive? No  Would patient like information on creating a medical advance directive? No - Patient declined     Chief Complaint  Patient presents with   Hospitalization Follow-up    HPI:  Pt is a 55 y.o. female who was admitted to [facility] on 08/10/21 post hospitalization 07/29/21 to 08/10/21.  She has a PMH of bipolar disorder, anxiety, depression, chronic pain syndrome and obesity.  She presented to the ED with nausea, decreased oral intake and confusion.  Patient's daughter reported poor oral intake, liquids/solids, following her laparoscopic cholecystectomy done on 5/6. She had anion gap 28 and creatinine 1.26. She was treated with IV fluids for hydration, antiemetics and cholestyramine.  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  CTA chest negative for PE/pneumonia or pulmonary infarction.  CT abdomen pelvis with mild acute pancreatitis. Of note, she was admitted to the hospital on 5/5 for acute cholecystitis.  Postop surgery was complicated by diarrhea and was started on cholestyramine.  She was readmitted on 06/09/2021 with a 4-day history of vomiting/diarrhea and poor oral intake with lipase of 263, UTI, AKI with high AGMA,  hyperglycemia and hyperbilirubinemia.  She was given IV fluid therapy and was discharged on 06/10/2021.  She again presented to the ED on 8/8 with emesis, abdominal pain, diarrhea and anion gap 16.  Symptoms resolved with IV fluids.  She was seen in her room  today while having a video call with daughter. Noted to be anxious and stated that she was given Ativan in the hospital.   Past Medical History:  Diagnosis Date   Anxiety    Bipolar disorder (HCC)    Chronic pain    Depression    Hypertension    patient denies   Past Surgical History:  Procedure Laterality Date   CHOLECYSTECTOMY N/A 04/20/2021   Procedure: LAPAROSCOPIC CHOLECYSTECTOMY WITH INTRAOPERATIVE CHOLANGIOGRAM;  Surgeon: [name], MD;  Location: [facility] ORS;  Service: General;  Laterality: N/A;   Right knee surgery     TONSILLECTOMY      Allergies  Allergen Reactions   Buprenorphine Rash    Patch gave rash- poss adhesive issues. Tried generic and brand name both. Patient states: "I am not allergic to the medication. I am allergic to the adhesive, not the medication."    Amoxicillin-Pot Clavulanate     Other reaction(s): Other (See Comments) States "strong" antibiotics cause C diff   Wound Dressing Adhesive     Rash   Sulfa Antibiotics Rash    Outpatient Encounter Medications as of 08/11/2021  Medication Sig   amLODipine (NORVASC) 10 MG tablet Take 1 tablet (10 mg total) by mouth daily.   Armodafinil 250 MG tablet Take 250 mg by mouth every morning.   aspirin-acetaminophen-caffeine (EXCEDRIN MIGRAINE) 250-250-65 MG tablet Take 1 tablet by mouth every 6 (six) hours as needed for headache.   cholestyramine light (PREVALITE) 4 g packet Take 1 packet (4 g total) by mouth 2 (two) times daily.   doxepin (SINEQUAN) 50 MG capsule Take 1 capsule (50 mg total) by mouth at  bedtime.   folic acid (FOLVITE) 1 MG tablet Take 1 tablet (1 mg total) by mouth daily.   HORIZANT 600 MG TBCR Take 600-1,200 mg by mouth in the morning and at bedtime. Take 1 tablet (600 mg) in the morning and Take 2 tablets (1200 mg) at bedtime   loperamide (IMODIUM) 2 MG capsule Take 1 capsule (2 mg total) by mouth every 6 (six) hours as needed for diarrhea or loose stools.   melatonin 3 MG TABS tablet  Take 1 tablet (3 mg total) by mouth at bedtime.   metoprolol tartrate (LOPRESSOR) 50 MG tablet Take 1 tablet (50 mg total) by mouth 2 (two) times daily.   Multiple Vitamin (MULTIVITAMIN WITH MINERALS) TABS tablet Take 1 tablet by mouth daily.   NON FORMULARY Diet: Heat healthy diet with thin liquids   NUCYNTA 100 MG TABS Take 200 mg by mouth daily in the afternoon.   NUCYNTA ER 200 MG TB12 Take 200 mg by mouth 2 (two) times daily.   ondansetron (ZOFRAN) 4 MG tablet Take 1 tablet (4 mg total) by mouth every 8 (eight) hours as needed for nausea or vomiting.   pantoprazole (PROTONIX) 40 MG tablet Take 1 tablet (40 mg total) by mouth daily.   thiamine 100 MG tablet Take 1 tablet (100 mg total) by mouth daily.   TRINTELLIX 20 MG TABS tablet Take 20 mg by mouth daily.   VRAYLAR 6 MG CAPS Take 6 mg by mouth daily.   No facility-administered encounter medications on file as of 08/11/2021.    Review of Systems  GENERAL: No change in appetite, no fatigue, no weight changes, no fever, chills or weakness MOUTH and THROAT: Denies oral discomfort, gingival pain or bleeding RESPIRATORY: no cough, SOB, DOE, wheezing, hemoptysis CARDIAC: No chest pain, edema or palpitations GI: No abdominal pain, diarrhea, constipation, heart burn, nausea or vomiting GU: Denies dysuria, frequency, hematuria, incontinence, or discharge NEUROLOGICAL: Denies dizziness, syncope, numbness, or headache PSYCHIATRIC: Denies feelings of depression or anxiety. No report of hallucinations, insomnia, paranoia, or agitation   Immunization History  Administered Date(s) Administered   Influenza,inj,Quad PF,6+ Mos 11/09/2014, 10/07/2018, 11/04/2019   Influenza-Unspecified 11/09/2014, 10/07/2018   Moderna SARS-COV2 Booster Vaccination 08/10/2021   PFIZER Comirnaty(Gray Top)Covid-19 Tri-Sucrose Vaccine 09/10/2020, 10/01/2020   Zoster Recombinat (Shingrix) 11/04/2019   Pertinent  Health Maintenance Due  Topic Date Due   PAP  SMEAR-Modifier  Never done   COLONOSCOPY (Pts 45-525yrs Insurance coverage will need to be confirmed)  Never done   MAMMOGRAM  Never done   INFLUENZA VACCINE  07/17/2021   No flowsheet data found.   Vitals:   08/11/21 1310  BP: 113/73  Pulse: 79  Resp: 18  Temp: (!) 97.5 F (36.4 C)  Weight: 247 lb 3.2 oz (112.1 kg)  Height: 5\' 4"  (1.626 m)   Body mass index is 42.43 kg/m.  Physical Exam  GENERAL APPEARANCE: Well nourished. In no acute distress. Morbidly obese. SKIN:  Sacrum and left buttock pressure ulcer stage II MOUTH and THROAT: Lips are without lesions. Oral mucosa is moist and without lesions.  RESPIRATORY: Breathing is even & unlabored, BS CTAB CARDIAC: RRR, no murmur,no extra heart sounds, no edema GI: Abdomen soft, normal BS, no masses, no tenderness NEUROLOGICAL: There is no tremor. Speech is clear. Alert and oriented X 3. PSYCHIATRIC:  Affect and behavior are appropriate  Labs reviewed: Recent Labs    08/03/21 0753 08/04/21 0525 08/09/21 0527  NA 145 141 139  K 3.2* 3.6 4.2  CL 101 104 103  CO2 GLUCOSE 89 88 97  BUN CREATININE 0.71 0.63 0.81  CALCIUM 9.8 8.8* 9.3  MG 2.4 2.1 2.3  PHOS 3.8 4.4 4.7*   Recent Labs    07/24/21 0953 07/30/21 0226 07/30/21 0843  AST ALT ALKPHOS 125 141* 128*  BILITOT 0.7 1.8* 2.0*  PROT 8.0 9.1* 8.0  ALBUMIN 4.7 5.5* 4.9   Recent Labs    07/24/21 0953 07/30/21 0226 07/30/21 0843 07/30/21 1717 07/31/21 0312 08/01/21 0246  WBC 6.9 20.1*   < > 18.7* 13.4* 7.3  NEUTROABS 6.1 18.0*  --  16.8*  --   --   HGB 13.4 14.8   < > 12.5 12.3 12.0  HCT 40.6 46.1*   < > 40.5 38.2 34.8*  MCV 87.9 94.9   < > 94.4 93.6 86.1  PLT 266 374   < > 253 214 204   < > = values in this interval not displayed.   No results found for: TSH No results found for: HGBA1C Lab Results  Component Value Date   TRIG 75 07/31/2021    Significant Diagnostic Results in last 30 days:  CT ABDOMEN  PELVIS WO CONTRAST  Result Date: 07/30/2021 CLINICAL DATA:  Abdominal pain and elevated lipase, unremarkable CT earlier in the same day. EXAM: CT ABDOMEN AND PELVIS WITHOUT CONTRAST TECHNIQUE: Multidetector CT imaging of the abdomen and pelvis was performed following the standard protocol without IV contrast. Examination is significantly limited by patient motion artifact. COMPARISON:  07/30/2021 FINDINGS: Lower chest: No acute abnormality is noted in the bases bilaterally. Hepatobiliary: No focal liver abnormality is seen. Status post cholecystectomy. No biliary dilatation. Pancreas: Pancreas shows no mass lesion or ductal obstruction. Some very minimal peripancreatic inflammatory changes are seen which may represent some mild acute pancreatitis. These changes have progressed slightly in the interval from the prior examination this morning. Spleen: Normal in size without focal abnormality. Adrenals/Urinary Tract: Adrenal glands are within normal limits. Kidneys show no renal calculi. Some residual contrast is seen within the right collecting system as well as the bladder. 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 Stomach/Bowel: Colon shows no obstructive or inflammatory changes. The appendix is not well seen although no inflammatory changes are seen. Small bowel and stomach are within normal limits. Vascular/Lymphatic: Aortic atherosclerosis. No enlarged abdominal or pelvic lymph nodes. Reproductive: Uterus and bilateral adnexa are unremarkable. Other: No abdominal wall hernia or abnormality. No abdominopelvic ascites. Musculoskeletal: No acute or significant osseous findings. IMPRESSION: Very minimal peripancreatic inflammatory change near the head of the pancreas. No significant phlegmon is noted. These changes are likely related to very mild acute appendicitis. Chronic changes similar to that seen on the prior exam. Examination is somewhat limited due to motion artifact.  Electronically Signed   By: Alcide Clever M.D.   On: 07/30/2021 17:06   CT ABDOMEN PELVIS WO CONTRAST  Result Date: 07/24/2021 CLINICAL DATA:  55 year old female with abdominal pain, diarrhea and vomiting since yesterday. Recent renal insufficiency. EXAM: CT ABDOMEN AND PELVIS WITHOUT CONTRAST TECHNIQUE: Multidetector CT imaging of the abdomen and pelvis was performed following the standard protocol without IV contrast. COMPARISON:  CT Abdomen and Pelvis 06/09/2021, 04/19/2021. FINDINGS: Lower chest: Negative.  No pericardial or pleural effusion. Hepatobiliary: Absent gallbladder.  Negative noncontrast liver. Pancreas: Negative. Spleen: Negative. Adrenals/Urinary Tract: Adrenal glands remain within normal limits. 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 No nephrolithiasis, hydronephrosis, or acute renal finding. Negative ureters, bladder. Stomach/Bowel: The cecum is on a lax mesentery located in the right upper abdomen. Appendix appears to be diminutive or absent. Decompressed terminal ileum. No dilated small bowel. Stomach and duodenum appear within normal limits. No convincing bowel inflammation. No free air or free fluid. Vascular/Lymphatic: Normal caliber abdominal aorta. Minimal calcified atherosclerosis. No lymphadenopathy. Reproductive: Negative noncontrast appearance. Other: No pelvic free fluid. Musculoskeletal: Grade 2 versus grade 3 spondylolisthesis at the lumbosacral junction with chronic L5 pars fractures and spinal stenosis appears stable. No acute osseous abnormality identified. IMPRESSION: 1. No acute or inflammatory process identified in the non-contrast abdomen or pelvis. 2. 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 Electronically Signed   By: Odessa Fleming M.D.   On: 07/24/2021 10:07   CT HEAD WO CONTRAST ( )  Result Date: 07/30/2021 CLINICAL DATA:   55 year old female with nausea, decreased p.o. Delirium, combative. EXAM: CT HEAD WITHOUT CONTRAST TECHNIQUE: Contiguous axial images were obtained from the base of the skull through the vertex without intravenous contrast. COMPARISON:  None. FINDINGS: Brain: Chronic encephalomalacia right inferior occipital lobe. Associated mild ex vacuo enlargement of the right occipital horn. Elsewhere cerebral volume is preserved, gray-white matter differentiation within normal limits. No superimposed No midline shift, ventriculomegaly, mass effect, evidence of mass lesion, intracranial hemorrhage or evidence of cortically based acute infarction. Vascular: Calcified atherosclerosis at the skull base. No suspicious intracranial vascular hyperdensity. Skull: Negative. Sinuses/Orbits: Visualized paranasal sinuses and mastoids are clear. Other: Visualized orbits and scalp soft tissues are within normal limits. IMPRESSION: Chronic Right PCA infarct.  No acute intracranial abnormality. Electronically Signed   By: Odessa Fleming M.D.   On: 07/30/2021 06:17   CT Angio Chest Pulmonary Embolism (PE) W or WO Contrast  Result Date: 07/30/2021 CLINICAL DATA:  Nausea.  Concern pulmonary embolism. EXAM: CT ANGIOGRAPHY CHEST WITH CONTRAST TECHNIQUE: Multidetector CT imaging of the chest was performed using the standard protocol during bolus administration of intravenous contrast. Multiplanar CT image reconstructions and MIPs were obtained to evaluate the vascular anatomy. CONTRAST:  23mL OMNIPAQUE IOHEXOL 350 MG/ML SOLN COMPARISON:  None. FINDINGS: Cardiovascular: No filling defects within the pulmonary arteries to suggest acute pulmonary embolism. Mediastinum/Nodes: No axillary or supraclavicular adenopathy. No mediastinal or hilar adenopathy. No pericardial fluid. Esophagus normal. Lungs/Pleura: No pulmonary infarction. No pneumonia. No pleural fluid. Upper Abdomen: Low-attenuation liver suggests hepatic steatosis. Musculoskeletal: Review of the  MIP images confirms the above findings. IMPRESSION: 1. No evidence of pulmonary embolism. 2. No evidence of pneumonia or pulmonary infarction. 3. Hepatic steatosis. Electronically Signed   By: Genevive Bi M.D.   On: 07/30/2021 06:20   CT ABDOMEN PELVIS W CONTRAST  Result Date: 07/30/2021 CLINICAL DATA:  Abdominal pain, acute, nonlocalized EXAM: CT ABDOMEN AND PELVIS WITH CONTRAST TECHNIQUE: Multidetector CT imaging of the abdomen and pelvis was performed using the standard protocol following bolus administration of intravenous contrast. CONTRAST:  15mL OMNIPAQUE IOHEXOL 350 MG/ML SOLN COMPARISON:  None. FINDINGS: Patient movement degrades imaging Lower chest: Lung bases are clear. Hepatobiliary: No focal hepatic lesion. Postcholecystectomy. No biliary dilatation. Low-attenuation liver suggests hepatic steatosis. Some streak artifact patient's arms at side. Pancreas: Pancreas is normal. No ductal dilatation. No pancreatic inflammation. Spleen: Normal spleen Adrenals/urinary tract: Adrenal glands are normal. No hydronephrosis. Fat density lesion lower pole of the LEFT kidney measures 8 mm (image 44/2).  Ureters and bladder normal. Stomach/Bowel: Small hiatal hernia. Stomach, small bowel, appendix, and cecum are normal. The colon and rectosigmoid colon are normal. Vascular/Lymphatic: Abdominal aorta is normal caliber. No periportal or retroperitoneal adenopathy. No pelvic adenopathy. Reproductive: Uterus and adnexa unremarkable. Other: No free fluid. 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 No change from prior. IMPRESSION: 1. No acute findings in the abdomen pelvis. 2. No evidence of bowel obstruction. 3. Postcholecystectomy without complication. 4. Probable hepatic steatosis. 5. Small RIGHT renal benign angiomyolipoma. Electronically Signed   By: Genevive Bi M.D.   On: 07/30/2021 06:14   DG Chest Port 1 View  Result Date: 07/30/2021 CLINICAL DATA:  Nausea,  questionable sepsis EXAM: PORTABLE CHEST 1 VIEW COMPARISON:  04/19/2021 FINDINGS: Low lung volumes. Lungs are clear.  No pleural effusion or pneumothorax. The heart is normal in size. IMPRESSION: No evidence of acute cardiopulmonary disease. Electronically Signed   By: Charline Bills M.D.   On: 07/30/2021 03:22    Assessment/Plan  1. Acute metabolic encephalopathy -   was found to have AGMA (anion gap metabolic acidosis) with anion gap 28, nausea and vomiting in the setting of poor oral intake -  was treated with IV fluids, anti-emetics and cholestyramine  2. Acute kidney injury (HCC) -   had creatinine of 1.26 on admission, thought to be due to dehydration -  was given IV fluid for hydration, creatinine normalized to 0.8  3. Bile acid malabsorption syndrome -   S/P cholecystectomy on 5/6 -    continue cholestyramine BID X 1 week then daily  4. Refeeding syndrome -    poor oral intake since cholecystectomy on 5/6, had multiple hospitalization with nausea/vomiting and poor oral intake -   Currently tolerating diet  5. Bipolar affective disorder, remission status unspecified (HCC) -Continue Vraylar, Trintellix, doxepin at reduced dose of 50 mg at bedtime  6. Chronic pain syndrome -    continue Nucynta and start Percocet 5-325 mg every 6 hours PRN X 1 day  7. Benign essential HTN  -  BP 113/73, continue amlodipine  8. Restless leg syndrome -   Continue Horizant  9. Anxiety -  will start Ativan 0.5 mg 1 tab every 8 hours PRN x7 days -   Psych consult     Family/ staff Communication: Discussed plan of care with resident and charge nurse.  Labs/tests ordered: None  Goals of care:   Short-term care   Kenard Gower, DNP, MSN, FNP-BC [facility] [phone] (Monday-Friday 8:00 a.m. - 5:00 p.m.) [phone] (after hours)

## 2021-08-14 ENCOUNTER — Encounter: Payer: Self-pay | Admitting: Internal Medicine

## 2021-08-14 ENCOUNTER — Non-Acute Institutional Stay (SKILLED_NURSING_FACILITY): Payer: BC Managed Care – PPO | Admitting: Internal Medicine

## 2021-08-14 DIAGNOSIS — L89152 Pressure ulcer of sacral region, stage 2: Secondary | ICD-10-CM

## 2021-08-14 DIAGNOSIS — G894 Chronic pain syndrome: Secondary | ICD-10-CM | POA: Diagnosis not present

## 2021-08-14 DIAGNOSIS — N179 Acute kidney failure, unspecified: Secondary | ICD-10-CM | POA: Diagnosis not present

## 2021-08-14 DIAGNOSIS — R29818 Other symptoms and signs involving the nervous system: Secondary | ICD-10-CM

## 2021-08-14 DIAGNOSIS — G9340 Encephalopathy, unspecified: Secondary | ICD-10-CM | POA: Diagnosis not present

## 2021-08-14 DIAGNOSIS — R4189 Other symptoms and signs involving cognitive functions and awareness: Secondary | ICD-10-CM

## 2021-08-14 NOTE — Progress Notes (Signed)
NURSING HOME LOCATION:  [facility] ROOM NUMBER: 115  CODE STATUS:  Full  PCP:  no PCP [facility] : Othella Boyer MD, Psychiatry, [facility]  This is a comprehensive admission note to this SNFperformed on this date less than 30 days from date of admission. Included are preadmission medical/surgical history; reconciled medication list; family history; social history and comprehensive review of systems.  Corrections and additions to the records were documented. Comprehensive physical exam was also performed. Additionally a clinical summary was entered for each active diagnosis pertinent to this admission in the Problem List to enhance continuity of care.  HPI: She was hospitalized 8/13 - 08/10/2021 with acute metabolic encephalopathy; pressure injury of skin; severe anion gap metabolic acidosis; starvation ketosis; bile acid malabsorption syndrome; and AKI. She presented to the ED with nausea, decreased oral intake, and confusion. She had previously been admitted on 5/5 for acute cholecystitis and had lap cholecystectomy performed 5/6.  Postoperative course was complicated by diarrhea for which cholestyramine was initiated. She was readmitted on 6/24 with a 4-day history of vomiting/diarrhea and poor oral intake.  Lipase at that time was 263.  AKI with high AGMA was present associated with hyperglycemia & hyperbilirubinemia.  These issues resolved with IV fluid therapy and she was discharged on 6/24. She presented again 8/8 with emesis, abdominal pain, diarrhea, and anion gap of 16.  Again IV fluids resolved the acute situation. In the ED 8/13 she was tachycardic with slightly elevated ammonia level.  She was initially admitted to [facility] for severe anion gap metabolic acidosis associated with nausea and vomiting secondary to starvation ketosis.  She was transferred to [facility] on 8/17. Anion gap was 28 at the time of admission.  She received IV fluid  hydration, antiemetics, and cholestyramine.  Initially cholestyramine was twice daily with subsequent weaning after 1 week to once daily.  By her history symptoms have returned when this has been discontinued. The acute metabolic encephalopathy was attributed to AKI, starvation ketosis and metabolic acidosis.  CT of the head showed chronic right PCA infarct without acute intracranial abnormality.  CTA of the chest was negative for PTE or pneumonia.  CT the abdomen and pelvis suggested mild acute pancreatitis. The patient was tolerating diet without any difficulty prior to discharge. Her bipolar medications were continued but doxepin was reduced to 50 mg nightly. Mild stage II sacral pressure injury was present prior to admission.  Wound care was continued while hospitalized. Morbid obesity was present with an estimated body mass index of 44.27 kg/mm. She was D/ced to the SNF for rehab.  Past medical and surgical history: Includes history of anxiety, chronic depression, chronic pain,chronic fatigue, bipolar disorder, RLS and essential hypertension. Surgeries and procedures include cholecystectomy and orthopedic surgery on the right knee.  Social history: Rare alcohol consumption by history; never smoked.  Family history: Limited history reviewed. Additional history includes heart disease, stroke, MI in maternal grandfather and heart disease in maternal aunts.   Review of systems: She is a good historian but states that she had to be restrained when she was in the ED due to the encephalopathy.  She relates this to not having been able to take her bipolar & chronic pain medications due to the nausea and vomiting as well as watery diarrhea.  She states that while hospitalized her blood pressure was elevated and she had tachycardia up to "144".  Now she states " I am so much better".  She does state  that she will get nausea if she eats too much.  She continues to be anxious but not  depressed.  Constitutional: No fever, significant weight change Eyes: No redness, discharge, pain, vision change ENT/mouth: No nasal congestion, purulent discharge, earache, change in hearing, sore throat  Cardiovascular: No chest pain, palpitations, paroxysmal nocturnal dyspnea, claudication, edema  Respiratory: No cough, sputum production, hemoptysis, DOE, significant snoring, apnea  Gastrointestinal: No heartburn, dysphagia, abdominal pain, rectal bleeding, melena, change in bowels Genitourinary: No dysuria, hematuria, pyuria, incontinence, nocturia Musculoskeletal: No joint stiffness, joint swelling, weakness, pain Dermatologic: No rash, pruritus, change in appearance of skin Neurologic: No dizziness, headache, syncope, seizures, numbness, tingling Psychiatric: No significant insomnia, anorexia Endocrine: No change in hair/skin/nails, excessive thirst, excessive hunger, excessive urination  Hematologic/lymphatic: No significant bruising, lymphadenopathy, abnormal bleeding Allergy/immunology: No itchy/watery eyes, significant sneezing, urticaria, angioedema  Physical exam:  Pertinent or positive findings: Facies tend to be blank.  Central obesity is present.  Breath sounds are decreased.  Abdomen is protuberant.  Pedal pulses are decreased.  She has trace edema at the ankles.  She does exhibit weakness to opposition,especially in the left upper extremity.  There is decreased elevation of the upper extremities suggesting some shoulder impingement.  General appearance: Adequately nourished; no acute distress, increased work of breathing is present.   Lymphatic: No lymphadenopathy about the head, neck, axilla. Eyes: No conjunctival inflammation or lid edema is present. There is no scleral icterus. Ears:  External ear exam shows no significant lesions or deformities.   Nose:  External nasal examination shows no deformity or inflammation. Nasal mucosa are pink and moist without lesions,  exudates Oral exam: Lips and gums are healthy appearing.There is no oropharyngeal erythema or exudate. Neck:  No thyromegaly, masses, tenderness noted.    Heart:  Normal rate and regular rhythm. S1 and S2 normal without gallop, murmur, click, rub.  Lungs:  without wheezes, rhonchi, rales, rubs. Abdomen: Bowel sounds are normal.  Abdomen is soft and nontender with no organomegaly, hernias, masses. GU: Deferred  Extremities:  No cyanosis, clubbing. Neurologic exam:  Balance, Rhomberg, finger to nose testing could not be completed due to clinical state Skin: Warm & dry w/o tenting. No significant lesions or rash.  See clinical summary under each active problem in the Problem List with associated updated therapeutic plan

## 2021-08-14 NOTE — Assessment & Plan Note (Signed)
This will need to be reassessed after being back on maintenance meds for several months .

## 2021-08-14 NOTE — Patient Instructions (Signed)
See assessment and plan under each diagnosis in the problem list and acutely for this visit 

## 2021-08-14 NOTE — Assessment & Plan Note (Addendum)
SNF Wound Care Nurse monitoring resident. She reports wound stable Refer to Wound Care Clinic if progressive

## 2021-08-14 NOTE — Assessment & Plan Note (Addendum)
See Dr Spalding Bing excellent assessment Creatinine peaked @ 1.26/ GFR nadir 50 ; CKD Stage 3a Current creatinine 0.81/ GFR >60 ; CKD Stage 2 Medication List reviewed. If CKD progresses, Horizant ( Gabapentin Enacarbil)  will be initially weaned to total daily dose < 1200 mg with BMET monitor.

## 2021-08-14 NOTE — Assessment & Plan Note (Signed)
See SLUMS score suggesting baseline neurocognitive issues. This should be evaluated by Dr Renold Genta after restabilization on psychotropic meds for several months.

## 2021-08-14 NOTE — Assessment & Plan Note (Addendum)
Any change in psychotropic regimen would require consultation with Dr Renold Genta

## 2021-08-29 ENCOUNTER — Encounter: Payer: Self-pay | Admitting: Adult Health

## 2021-08-29 ENCOUNTER — Non-Acute Institutional Stay (SKILLED_NURSING_FACILITY): Payer: BC Managed Care – PPO | Admitting: Adult Health

## 2021-08-29 DIAGNOSIS — F319 Bipolar disorder, unspecified: Secondary | ICD-10-CM

## 2021-08-29 DIAGNOSIS — F5101 Primary insomnia: Secondary | ICD-10-CM

## 2021-08-29 DIAGNOSIS — G47411 Narcolepsy with cataplexy: Secondary | ICD-10-CM

## 2021-08-29 DIAGNOSIS — K9089 Other intestinal malabsorption: Secondary | ICD-10-CM

## 2021-08-29 DIAGNOSIS — G894 Chronic pain syndrome: Secondary | ICD-10-CM

## 2021-08-29 DIAGNOSIS — I10 Essential (primary) hypertension: Secondary | ICD-10-CM

## 2021-08-29 DIAGNOSIS — G9340 Encephalopathy, unspecified: Secondary | ICD-10-CM

## 2021-08-29 DIAGNOSIS — K219 Gastro-esophageal reflux disease without esophagitis: Secondary | ICD-10-CM

## 2021-08-29 DIAGNOSIS — N179 Acute kidney failure, unspecified: Secondary | ICD-10-CM

## 2021-08-29 NOTE — Progress Notes (Addendum)
Location:  [facility] Room Number: 115 A Place of Service:  SNF (31) Provider:  Kenard Gower, DNP, FNP-BC  Patient Care Team: Pcp, No as PCP - General  Extended Emergency Contact Information Primary Emergency Contact: sorangel, lopresti Mobile Phone: [phone] Relation: Daughter  Code Status:  FULL CODE  Goals of care: Advanced Directive information Advanced Directives 08/29/2021  Does Patient Have a Medical Advance Directive? No  Would patient like information on creating a medical advance directive? -     Chief Complaint  Patient presents with   Discharge Note    For discharge home on  08/31/21    HPI:  Pt is a 55 y.o. female who is for discharge home on 08/31/21 and will have 24-hour supervision from family.  She was admitted to [facility] on 08/10/2021 post hospital admission 07/29/2021 to 08/10/2021.  She has a PMH of bipolar disorder, anxiety, depression, chronic pain syndrome and obesity.  She presented to the ED with nausea, decreased oral intake and confusion.  Patient's daughter reported poor oral intake, liquids/solids, following her laparoscopic cholecystectomy done on 5/6.  She had anion gap 28 and creatinine 1.26.  She was treated with IV fluids for hydration, antiemetics and cholestyramine.  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  CTA chest negative for PE/pneumonia or pulmonary infarction.  CT abdomen pelvis with mild acute pancreatitis.  Of note, she was admitted to the hospital on 5/5 for acute cholecystitis.  Postop surgery was complicated by diarrhea and was started on cholestyramine.  She was readmitted on 06/09/2021 with a 4-day history of vomiting/diarrhea and poor oral intake with lipase of 263, UTI, AKI with high AGMA, hyperglycemia and hyperbilirubinemia.  She was given IV fluid therapy and was discharged on 06/10/2021.  She again presented to the ED on 8/8 with emesis,  abdominal pain, diarrhea and anion gap 16.  Symptoms resolved with IV fluids.  Patient was admitted to this facility for short-term rehabilitation after the patient's recent hospitalization.  Patient has completed SNF rehabilitation and therapy has cleared the patient for discharge.   Past Medical History:  Diagnosis Date   Anxiety    Bipolar disorder (HCC)    Chronic pain    Depression    Hypertension    patient denies   Past Surgical History:  Procedure Laterality Date   CHOLECYSTECTOMY N/A 04/20/2021   Procedure: LAPAROSCOPIC CHOLECYSTECTOMY WITH INTRAOPERATIVE CHOLANGIOGRAM;  Surgeon: [name], MD;  Location: [facility] ORS;  Service: General;  Laterality: N/A;   Right knee surgery     TONSILLECTOMY      Allergies  Allergen Reactions   Buprenorphine Rash    Patch gave rash- poss adhesive issues. Tried generic and brand name both. Patient states: "I am not allergic to the medication. I am allergic to the adhesive, not the medication."    Amoxicillin-Pot Clavulanate     Other reaction(s): Other (See Comments) States "strong" antibiotics cause C diff   Wound Dressing Adhesive     Rash   Sulfa Antibiotics Rash    Outpatient Encounter Medications as of 08/29/2021  Medication Sig   acetaminophen (TYLENOL) 325 MG tablet Take 650 mg by mouth as needed.   amLODipine (NORVASC) 10 MG tablet Take 1 tablet (10 mg total) by mouth daily.   Armodafinil 250 MG tablet Take 250 mg by mouth every morning.   aspirin-acetaminophen-caffeine (EXCEDRIN MIGRAINE) 250-250-65 MG tablet Take 1 tablet by mouth every 6 (six) hours as  needed for headache.   cholestyramine light (PREVALITE) 4 g packet Take 1 packet (4 g total) by mouth 2 (two) times daily.   doxepin (SINEQUAN) 50 MG capsule Take 1 capsule (50 mg total) by mouth at bedtime.   Ensure (ENSURE) Take 237 mLs by mouth 2 (two) times daily.   folic acid (FOLVITE) 1 MG tablet Take 1 tablet (1 mg total) by mouth daily.   HORIZANT 600 MG TBCR  Take 600-1,200 mg by mouth in the morning and at bedtime. Take 1 tablet (600 mg) in the morning and Take 2 tablets (1200 mg) at bedtime   loperamide (IMODIUM) 2 MG capsule Take 1 capsule (2 mg total) by mouth every 6 (six) hours as needed for diarrhea or loose stools.   melatonin 3 MG TABS tablet Take 1 tablet (3 mg total) by mouth at bedtime.   metoprolol tartrate (LOPRESSOR) 50 MG tablet Take 1 tablet (50 mg total) by mouth 2 (two) times daily.   Multiple Vitamin (MULTIVITAMIN WITH MINERALS) TABS tablet Take 1 tablet by mouth daily.   NON FORMULARY Diet: Heat healthy diet with thin liquids   NUCYNTA 100 MG TABS Take 2 tablets (200 mg total) by mouth daily in the afternoon.   NUCYNTA ER 200 MG TB12 Take 200 mg by mouth 2 (two) times daily.   nystatin powder Apply 1 application topically 2 (two) times daily.   ondansetron (ZOFRAN) 4 MG tablet Take 1 tablet (4 mg total) by mouth every 8 (eight) hours as needed for nausea or vomiting.   pantoprazole (PROTONIX) 40 MG tablet Take 1 tablet (40 mg total) by mouth daily.   thiamine 100 MG tablet Take 1 tablet (100 mg total) by mouth daily.   TRINTELLIX 20 MG TABS tablet Take 20 mg by mouth daily.   VRAYLAR 6 MG CAPS Take 6 mg by mouth daily.   No facility-administered encounter medications on file as of 08/29/2021.    Review of Systems  GENERAL: No change in appetite, no fatigue, no weight changes, no fever, chills or weakness MOUTH and THROAT: Denies oral discomfort, gingival pain or bleeding RESPIRATORY: no cough, SOB, DOE, wheezing, hemoptysis CARDIAC: No chest pain, edema or palpitations GI: No abdominal pain, diarrhea, constipation, heart burn, nausea or vomiting GU: Denies dysuria, frequency, hematuria, incontinence, or discharge NEUROLOGICAL: Denies dizziness, syncope, numbness, or headache PSYCHIATRIC: Denies feelings of depression or anxiety. No report of hallucinations, insomnia, paranoia, or agitation   Immunization History   Administered Date(s) Administered   Influenza Inj Mdck Quad Pf 10/22/2016, 09/30/2017   Influenza Split 10/06/2015   Influenza,inj,Quad PF,6+ Mos 11/09/2014, 10/07/2018, 11/04/2019   Influenza-Unspecified 10/30/1966, 11/09/2014, 10/07/2018   Moderna SARS-COV2 Booster Vaccination 08/10/2021   PFIZER Comirnaty(Gray Top)Covid-19 Tri-Sucrose Vaccine 09/10/2020, 10/01/2020   Tdap [birth year], 10/06/2015   Zoster Recombinat (Shingrix) 11/04/2019   Pertinent  Health Maintenance Due  Topic Date Due   PAP SMEAR-Modifier  Never done   COLONOSCOPY (Pts 45-58yrs Insurance coverage will need to be confirmed)  Never done   MAMMOGRAM  Never done   INFLUENZA VACCINE  07/17/2021   No flowsheet data found.   Vitals:   08/29/21 1502  BP: 132/88  Pulse: 70  Resp: 18  Temp: 98 F (36.7 C)  Height: 5\' 4"  (1.626 m)   Body mass index is 42.43 kg/m.  Physical Exam  GENERAL APPEARANCE: Well nourished. In no acute distress. Morbidly obese. SKIN:  Skin is warm and dry.  MOUTH and THROAT: Lips are without lesions. Oral mucosa  is moist and without lesions.  RESPIRATORY: Breathing is even & unlabored, BS CTAB CARDIAC: RRR, no murmur,no extra heart sounds, no edema GI: Abdomen soft, normal BS, no masses, no tenderness EXTREMITIES:  Able to move X 4 extremities NEUROLOGICAL: There is no tremor. Speech is clear. Alert and oriented X 3. PSYCHIATRIC:  Affect and behavior are appropriate  Labs reviewed: Recent Labs    08/03/21 0753 08/04/21 0525 08/09/21 0527  NA 145 141 139  K 3.2* 3.6 4.2  CL 101 104 103  CO2 30 30 29   GLUCOSE 89 88 97  BUN 7 6 16   CREATININE 0.71 0.63 0.81  CALCIUM 9.8 8.8* 9.3  MG 2.4 2.1 2.3  PHOS 3.8 4.4 4.7*   Recent Labs    07/24/21 0953 07/30/21 0226 07/30/21 0843  AST 20 23 19   ALT 18 27 22   ALKPHOS 125 141* 128*  BILITOT 0.7 1.8* 2.0*  PROT 8.0 9.1* 8.0  ALBUMIN 4.7 5.5* 4.9   Recent Labs    07/24/21 0953 07/30/21 0226 07/30/21 0843 07/30/21 1717  07/31/21 0312 08/01/21 0246  WBC 6.9 20.1*   < > 18.7* 13.4* 7.3  NEUTROABS 6.1 18.0*  --  16.8*  --   --   HGB 13.4 14.8   < > 12.5 12.3 12.0  HCT 40.6 46.1*   < > 40.5 38.2 34.8*  MCV 87.9 94.9   < > 94.4 93.6 86.1  PLT 266 374   < > 253 214 204   < > = values in this interval not displayed.    Lab Results  Component Value Date   TRIG 75 07/31/2021     Assessment/Plan  1. Acute encephalopathy -  was found to have AGMA (anion gap metabolic acidosis) with anion gap 28, nausea and vomiting in the setting of poor oral intake - ondansetron (ZOFRAN ODT) 4 MG disintegrating tablet; Take 1 tablet (4 mg total) by mouth every 8 (eight) hours as needed for nausea or vomiting.  Dispense: 20 tablet; Refill: 0 - loperamide (IMODIUM) 2 MG capsule; Take 1 capsule (2 mg total) by mouth every 6 (six) hours as needed for diarrhea or loose stools.  Dispense: 30 capsule; Refill: 0  2. AKI (acute kidney injury) (HCC) -    had creatinine of 1.26 on admission, thought to be due to dehydration -   was given IV fluids for hydration, creatinine normalized to 0.8  3. Bile acid malabsorption syndrome - cholestyramine light (PREVALITE) 4 g packet; Take 1 packet (4 g total) by mouth 2 (two) times daily.  Dispense: 60 packet; Refill: 0  4. Chronic pain syndrome -  follows up with pain clinic -  continue Nucynta ER 200 mg 1 tab BID, Horizant ER 600 mg 1 tab every morning, Nucynta 100 mg take 2 tabss= 200mg  in the afternoon and Horizant ER 600 mg 2 tabs at bedtime  5. Benign essential HTN - metoprolol tartrate (LOPRESSOR) 50 MG tablet; Take 1 tablet (50 mg total) by mouth 2 (two) times daily.  Dispense: 60 tablet; Refill: 0 - amLODipine (NORVASC) 10 MG tablet; Take 1 tablet (10 mg total) by mouth daily.  Dispense: 30 tablet; Refill: 0  6. Primary narcolepsy with cataplexy -  continue Armodafinil 250 mg daily  7. Bipolar affective disorder, remission status unspecified (HCC) -  continue Vraylar 6 mg 1 tab  daily. Doxepin 50 mg 1 capsule at bedtime and Trintellix 20 mg daily  8. Primary insomnia - melatonin 3 MG TABS tablet; Take  1 tablet (3 mg total) by mouth at bedtime.  Dispense: 30 tablet; Refill: 0  9. Gastroesophageal reflux disease without esophagitis -  continue Protonix 40 mg daily    I have filled out patient's discharge paperwork and e-prescribed medications.  Patient will have 24-hour supervision by family.  DME provided:  Tub transfer bench  Total discharge time: Greater than 30 minutes Greater than 50% was spent in counseling and coordination of care.   Discharge time involved coordination of the discharge process with social worker, nursing staff and therapy department. Medical justification for home health services/DME verified.    Kenard Gower, DNP, MSN, FNP-BC [facility] [phone] (Monday-Friday 8:00 a.m. - 5:00 p.m.) [phone] (after hours)

## 2021-08-31 ENCOUNTER — Telehealth: Payer: Self-pay | Admitting: *Deleted

## 2021-08-31 MED ORDER — PANTOPRAZOLE SODIUM 40 MG PO TBEC: 40.0000 mg | DELAYED_RELEASE_TABLET | Freq: Every day | ORAL | 0 refills | Status: DC

## 2021-08-31 MED ORDER — THIAMINE HCL 100 MG PO TABS: 100.0000 mg | ORAL_TABLET | Freq: Every day | ORAL | 0 refills | Status: DC

## 2021-08-31 MED ORDER — ONDANSETRON 4 MG PO TBDP: 4.0000 mg | ORAL_TABLET | Freq: Three times a day (TID) | ORAL | 0 refills | Status: DC | PRN

## 2021-08-31 MED ORDER — FOLIC ACID 1 MG PO TABS: 1.0000 mg | ORAL_TABLET | Freq: Every day | ORAL | 0 refills | Status: DC

## 2021-08-31 MED ORDER — METOPROLOL TARTRATE 50 MG PO TABS: 50.0000 mg | ORAL_TABLET | Freq: Two times a day (BID) | ORAL | 0 refills | Status: DC

## 2021-08-31 MED ORDER — AMLODIPINE BESYLATE 10 MG PO TABS: 10.0000 mg | ORAL_TABLET | Freq: Every day | ORAL | 0 refills | Status: DC

## 2021-08-31 MED ORDER — NYSTATIN 100000 UNIT/GM EX POWD: 1.0000 "application " | Freq: Two times a day (BID) | CUTANEOUS | 0 refills | Status: DC

## 2021-08-31 MED ORDER — LOPERAMIDE HCL 2 MG PO CAPS: 2.0000 mg | ORAL_CAPSULE | Freq: Four times a day (QID) | ORAL | 0 refills | Status: DC | PRN

## 2021-08-31 MED ORDER — MELATONIN 3 MG PO TABS: 3.0000 mg | ORAL_TABLET | Freq: Every day | ORAL | 0 refills | Status: DC

## 2021-08-31 MED ORDER — CHOLESTYRAMINE LIGHT 4 G PO PACK: 4.0000 g | PACK | Freq: Two times a day (BID) | ORAL | 0 refills | Status: DC

## 2021-08-31 NOTE — Telephone Encounter (Signed)
Received EScribing Error from pharmacy.  Called [facility] and spoke with Dorene Grebe and she stated that they Have received Rx's.

## 2021-09-11 ENCOUNTER — Ambulatory Visit: Payer: BC Managed Care – PPO | Admitting: Nurse Practitioner

## 2021-09-21 ENCOUNTER — Other Ambulatory Visit: Payer: Self-pay

## 2021-09-21 ENCOUNTER — Ambulatory Visit (INDEPENDENT_AMBULATORY_CARE_PROVIDER_SITE_OTHER): Payer: BC Managed Care – PPO | Admitting: Nurse Practitioner

## 2021-09-21 ENCOUNTER — Encounter: Payer: Self-pay | Admitting: Nurse Practitioner

## 2021-09-21 VITALS — BP 126/73 | HR 90 | Temp 98.6°F | Ht 64.0 in | Wt 246.6 lb

## 2021-09-21 DIAGNOSIS — I10 Essential (primary) hypertension: Secondary | ICD-10-CM | POA: Diagnosis not present

## 2021-09-21 DIAGNOSIS — Z7689 Persons encountering health services in other specified circumstances: Secondary | ICD-10-CM | POA: Diagnosis not present

## 2021-09-21 DIAGNOSIS — K9089 Other intestinal malabsorption: Secondary | ICD-10-CM

## 2021-09-21 DIAGNOSIS — K219 Gastro-esophageal reflux disease without esophagitis: Secondary | ICD-10-CM | POA: Diagnosis not present

## 2021-09-21 DIAGNOSIS — Z23 Encounter for immunization: Secondary | ICD-10-CM

## 2021-09-21 DIAGNOSIS — E162 Hypoglycemia, unspecified: Secondary | ICD-10-CM

## 2021-09-21 DIAGNOSIS — G9340 Encephalopathy, unspecified: Secondary | ICD-10-CM

## 2021-09-21 LAB — POCT GLYCOSYLATED HEMOGLOBIN (HGB A1C)
HbA1c POC (<> result, manual entry): 4.9 % (ref 4.0–5.6)
HbA1c, POC (controlled diabetic range): 4.9 % (ref 0.0–7.0)
HbA1c, POC (prediabetic range): 4.9 % — AB (ref 5.7–6.4)
Hemoglobin A1C: 4.9 % (ref 4.0–5.6)

## 2021-09-21 MED ORDER — ONDANSETRON 4 MG PO TBDP: 4.0000 mg | ORAL_TABLET | Freq: Three times a day (TID) | ORAL | 0 refills | Status: DC | PRN

## 2021-09-21 MED ORDER — PANTOPRAZOLE SODIUM 40 MG PO TBEC: 40.0000 mg | DELAYED_RELEASE_TABLET | Freq: Every day | ORAL | 3 refills | Status: DC

## 2021-09-21 MED ORDER — HYDROXYZINE HCL 10 MG PO TABS
10.0000 mg | ORAL_TABLET | Freq: Three times a day (TID) | ORAL | 3 refills | Status: DC | PRN
Start: 2021-09-21 — End: 2022-12-14

## 2021-09-21 MED ORDER — AMLODIPINE BESYLATE 10 MG PO TABS: 10.0000 mg | ORAL_TABLET | Freq: Every day | ORAL | 3 refills | Status: DC

## 2021-09-21 MED ORDER — FOLIC ACID 1 MG PO TABS: 1.0000 mg | ORAL_TABLET | Freq: Every day | ORAL | 3 refills | Status: DC

## 2021-09-21 MED ORDER — VITAMIN B-12 100 MCG PO TABS: 100.0000 ug | ORAL_TABLET | Freq: Every day | ORAL | 3 refills | Status: AC

## 2021-09-21 MED ORDER — CHOLESTYRAMINE LIGHT 4 G PO PACK: 4.0000 g | PACK | Freq: Two times a day (BID) | ORAL | 3 refills | Status: DC

## 2021-09-21 MED ORDER — METOPROLOL TARTRATE 50 MG PO TABS: 50.0000 mg | ORAL_TABLET | Freq: Two times a day (BID) | ORAL | 3 refills | Status: DC

## 2021-09-21 MED ORDER — THIAMINE HCL 100 MG PO TABS: 100.0000 mg | ORAL_TABLET | Freq: Every day | ORAL | 3 refills | Status: AC

## 2021-09-21 NOTE — Patient Instructions (Signed)
Health Maintenance, Female Adopting a healthy lifestyle and getting preventive care are important in promoting health and wellness. Ask your health care provider about: The right schedule for you to have regular tests and exams. Things you can do on your own to prevent diseases and keep yourself healthy. What should I know about diet, weight, and exercise? Eat a healthy diet  Eat a diet that includes plenty of vegetables, fruits, low-fat dairy products, and lean protein. Do not eat a lot of foods that are high in solid fats, added sugars, or sodium. Maintain a healthy weight Body mass index (BMI) is used to identify weight problems. It estimates body fat based on height and weight. Your health care provider can help determine your BMI and help you achieve or maintain a healthy weight. Get regular exercise Get regular exercise. This is one of the most important things you can do for your health. Most adults should: Exercise for at least 150 minutes each week. The exercise should increase your heart rate and make you sweat (moderate-intensity exercise). Do strengthening exercises at least twice a week. This is in addition to the moderate-intensity exercise. Spend less time sitting. Even light physical activity can be beneficial. Watch cholesterol and blood lipids Have your blood tested for lipids and cholesterol at 55 years of age, then have this test every 5 years. Have your cholesterol levels checked more often if: Your lipid or cholesterol levels are high. You are older than 55 years of age. You are at high risk for heart disease. What should I know about cancer screening? Depending on your health history and family history, you may need to have cancer screening at various ages. This may include screening for: Breast cancer. Cervical cancer. Colorectal cancer. Skin cancer. Lung cancer. What should I know about heart disease, diabetes, and high blood pressure? Blood pressure and heart  disease High blood pressure causes heart disease and increases the risk of stroke. This is more likely to develop in people who have high blood pressure readings, are of African descent, or are overweight. Have your blood pressure checked: Every 3-5 years if you are 18-39 years of age. Every year if you are 40 years old or older. Diabetes Have regular diabetes screenings. This checks your fasting blood sugar level. Have the screening done: Once every three years after age 40 if you are at a normal weight and have a low risk for diabetes. More often and at a younger age if you are overweight or have a high risk for diabetes. What should I know about preventing infection? Hepatitis B If you have a higher risk for hepatitis B, you should be screened for this virus. Talk with your health care provider to find out if you are at risk for hepatitis B infection. Hepatitis C Testing is recommended for: Everyone born from 1945 through 1965. Anyone with known risk factors for hepatitis C. Sexually transmitted infections (STIs) Get screened for STIs, including gonorrhea and chlamydia, if: You are sexually active and are younger than 55 years of age. You are older than 55 years of age and your health care provider tells you that you are at risk for this type of infection. Your sexual activity has changed since you were last screened, and you are at increased risk for chlamydia or gonorrhea. Ask your health care provider if you are at risk. Ask your health care provider about whether you are at high risk for HIV. Your health care provider may recommend a prescription medicine   to help prevent HIV infection. If you choose to take medicine to prevent HIV, you should first get tested for HIV. You should then be tested every 3 months for as long as you are taking the medicine. Pregnancy If you are about to stop having your period (premenopausal) and you may become pregnant, seek counseling before you get  pregnant. Take 400 to 800 micrograms (mcg) of folic acid every day if you become pregnant. Ask for birth control (contraception) if you want to prevent pregnancy. Osteoporosis and menopause Osteoporosis is a disease in which the bones lose minerals and strength with aging. This can result in bone fractures. If you are 37 years old or older, or if you are at risk for osteoporosis and fractures, ask your health care provider if you should: Be screened for bone loss. Take a calcium or vitamin D supplement to lower your risk of fractures. Be given hormone replacement therapy (HRT) to treat symptoms of menopause. Follow these instructions at home: Lifestyle Do not use any products that contain nicotine or tobacco, such as cigarettes, e-cigarettes, and chewing tobacco. If you need help quitting, ask your health care provider. Do not use street drugs. Do not share needles. Ask your health care provider for help if you need support or information about quitting drugs. Alcohol use Do not drink alcohol if: Your health care provider tells you not to drink. You are pregnant, may be pregnant, or are planning to become pregnant. If you drink alcohol: Limit how much you use to 0-1 drink a day. Limit intake if you are breastfeeding. Be aware of how much alcohol is in your drink. In the [place] one drink equals one 12 oz bottle of beer (355 mL), one 5 oz glass of wine (148 mL), or one 1 oz glass of hard liquor (44 mL). General instructions Schedule regular health, dental, and eye exams. Stay current with your vaccines. Tell your health care provider if: You often feel depressed. You have ever been abused or do not feel safe at home. Summary Adopting a healthy lifestyle and getting preventive care are important in promoting health and wellness. Follow your health care provider's instructions about healthy diet, exercising, and getting tested or screened for diseases. Follow your health care provider's  instructions on monitoring your cholesterol and blood pressure. This information is not intended to replace advice given to you by your health care provider. Make sure you discuss any questions you have with your health care provider. Document Revised: 02/10/2021 Document Reviewed: 11/26/2018 Elsevier Patient Education  2022 [employer]  Managing Anxiety, Adult After being diagnosed with an anxiety disorder, you may be relieved to know why you have felt or behaved a certain way. You may also feel overwhelmed about the treatment ahead and what it will mean for your life. With care and support, you can manage this condition and recover from it. How to manage lifestyle changes Managing stress and anxiety Stress is your body's reaction to life changes and events, both good and bad. Most stress will last just a few hours, but stress can be ongoing and can lead to more than just stress. Although stress can play a major role in anxiety, it is not the same as anxiety. Stress is usually caused by something external, such as a deadline, test, or competition. Stress normally passes after the triggering event has ended.  Anxiety is caused by something internal, such as imagining a terrible outcome or worrying that something will go wrong that will devastate  you. Anxiety often does not go away even after the triggering event is over, and it can become long-term (chronic) worry. It is important to understand the differences between stress and anxiety and to manage your stress effectively so that it does not lead to an anxious response. Talk with your health care provider or a counselor to learn more about reducing anxiety and stress. He or she may suggest tension reduction techniques, such as: Music therapy. This can include creating or listening to music that you enjoy and that inspires you. Mindfulness-based meditation. This involves being aware of your normal breaths while not trying to control your breathing.  It can be done while sitting or walking. Centering prayer. This involves focusing on a word, phrase, or sacred image that means something to you and brings you peace. Deep breathing. To do this, expand your stomach and inhale slowly through your nose. Hold your breath for 3-5 seconds. Then exhale slowly, letting your stomach muscles relax. Self-talk. This involves identifying thought patterns that lead to anxiety reactions and changing those patterns. Muscle relaxation. This involves tensing muscles and then relaxing them. Choose a tension reduction technique that suits your lifestyle and personality. These techniques take time and practice. Set aside 5-15 minutes a day to do them. Therapists can offer counseling and training in these techniques. The training to help with anxiety may be covered by some insurance plans. Other things you can do to manage stress and anxiety include: Keeping a stress/anxiety diary. This can help you learn what triggers your reaction and then learn ways to manage your response. Thinking about how you react to certain situations. You may not be able to control everything, but you can control your response. Making time for activities that help you relax and not feeling guilty about spending your time in this way. Visual imagery and yoga can help you stay calm and relax.  Medicines Medicines can help ease symptoms. Medicines for anxiety include: Anti-anxiety drugs. Antidepressants. Medicines are often used as a primary treatment for anxiety disorder. Medicines will be prescribed by a health care provider. When used together, medicines, psychotherapy, and tension reduction techniques may be the most effective treatment. Relationships Relationships can play a big part in helping you recover. Try to spend more time connecting with trusted friends and family members. Consider going to couples counseling, taking family education classes, or going to family therapy. Therapy can  help you and others better understand your condition. How to recognize changes in your anxiety Everyone responds differently to treatment for anxiety. Recovery from anxiety happens when symptoms decrease and stop interfering with your daily activities at home or work. This may mean that you will start to: Have better concentration and focus. Worry will interfere less in your daily thinking. Sleep better. Be less irritable. Have more energy. Have improved memory. It is important to recognize when your condition is getting worse. Contact your health care provider if your symptoms interfere with home or work and you feel like your condition is not improving. Follow these instructions at home: Activity Exercise. Most adults should do the following: Exercise for at least 150 minutes each week. The exercise should increase your heart rate and make you sweat (moderate-intensity exercise). Strengthening exercises at least twice a week. Get the right amount and quality of sleep. Most adults need 7-9 hours of sleep each night. Lifestyle  Eat a healthy diet that includes plenty of vegetables, fruits, whole grains, low-fat dairy products, and lean protein. Do not eat a  lot of foods that are high in solid fats, added sugars, or salt. Make choices that simplify your life. Do not use any products that contain nicotine or tobacco, such as cigarettes, e-cigarettes, and chewing tobacco. If you need help quitting, ask your health care provider. Avoid caffeine, alcohol, and certain over-the-counter cold medicines. These may make you feel worse. Ask your pharmacist which medicines to avoid. General instructions Take over-the-counter and prescription medicines only as told by your health care provider. Keep all follow-up visits as told by your health care provider. This is important. Where to find support You can get help and support from these sources: Self-help groups. Online and [employer]. A  trusted spiritual leader. Couples counseling. Family education classes. Family therapy. Where to find more information You may find that joining a support group helps you deal with your anxiety. The following sources can help you locate counselors or support groups near you: Mental Health [place]: www.mentalhealthamerica.net Anxiety and [employer] of [place] (ADAA): [employer] Mental Illness (NAMI): www.nami.org Contact a health care provider if you: Have a hard time staying focused or finishing daily tasks. Spend many hours a day feeling worried about everyday life. Become exhausted by worry. Start to have headaches, feel tense, or have nausea. Urinate more than normal. Have diarrhea. Get help right away if you have: A racing heart and shortness of breath. Thoughts of hurting yourself or others. If you ever feel like you may hurt yourself or others, or have thoughts about taking your own life, get help right away. You can go to your nearest emergency department or call: Your local emergency services (911 in the [place] A suicide crisis helpline, such as the National Suicide Prevention Lifeline at (218)412-5944. This is open 24 hours a day. Summary Taking steps to learn and use tension reduction techniques can help calm you and help prevent triggering an anxiety reaction. When used together, medicines, psychotherapy, and tension reduction techniques may be the most effective treatment. Family, friends, and partners can play a big part in helping you recover from an anxiety disorder. This information is not intended to replace advice given to you by your health care provider. Make sure you discuss any questions you have with your health care provider. Document Revised: 05/05/2019 Document Reviewed: 05/05/2019 Elsevier Patient Education  2022 Else[employer] Inc. Managing Bipolar Disorder When someone is diagnosed with bipolar disorder, the person may be  relieved to now know why he or she has felt or behaved a certain way. The person may also feel overwhelmed about the treatment ahead, how to get needed support, and how to deal with the condition each day. With care and support, a person with bipolar disorder can learn to manage his or her symptoms and live with the condition. How to manage lifestyle changes Managing stress Stress is your body's reaction to life changes and events, both good and bad. Stress can play a major role in bipolar disorder, so it is important to learn how to manage stress. Some techniques to help you manage stress include: Meditation, muscle relaxation, and breathing exercises. Exercise. Even a short daily walk can help to lower stress levels. Getting enough good-quality sleep. Too little sleep can cause mania to start (can trigger mania). Making a schedule to manage your time. Knowing your daily schedule can help to keep you from feeling overwhelmed by tasks and deadlines. Spending time on hobbies you enjoy.  Medicines Your health care provider may suggest certain medicines if he or she  feels that they will help improve your condition. Avoid using caffeine, alcohol, and other substances that may prevent your medicines from working properly. It is also important to: Talk with your pharmacist or health care provider about all the medicines that you take, their possible side effects, and which medicines are safe to take together. Make it your goal to take part in all treatment decisions (shared decision-making). Ask about possible side effects of medicines that your health care provider recommends, and tell him or her how you feel about having those side effects. It is best if shared decision-making with your health care provider is part of your total treatment plan. If you are taking medicines as part of your treatment, do not stop taking medicines before you ask your health care provider if it is safe to stop. You may need to  have the medicine slowly decreased (tapered) over time to lower the risk of harmful side effects. Relationships Spend time with people whom you trust and with whom you feel a sense of understanding and calm. Try to find friends or family members who make you feel safe and can help you control feelings of mania. Consider going to couples counseling, family education classes, or family therapy to: Educate your loved ones about your condition and offer suggestions about how they can support you. Help resolve conflicts. Help develop communication skills in your relationships.  How to recognize changes in your condition Everyone responds differently to treatment for bipolar disorder. Some signs that your condition is improving include: Leveling of your mood. You may have less anger and excitement about daily activities, and your low moods may not be as bad. Your symptoms being less intense. Feeling calm more often. Thinking clearly. Not experiencing consequences for extreme behavior. Feeling like your life is settling down. Your behavior seeming more normal to you and to other people. Some signs that your condition may be getting worse include: Sleep problems. Moods cycling between deep lows and unusually high (excess) energy. Extreme emotions. More anger at loved ones. Staying away from others, or isolating yourself. A feeling of power or superiority. Completing a lot of tasks in a very short amount of time. Unusual thoughts and behaviors. Suicidal thoughts. Follow these instructions at home: Medicines Take over-the-counter and prescription medicines only as told by your health care provider or pharmacist. Ask your pharmacist what over-the-counter cold medicines you should avoid. Some medicines can make symptoms worse. General instructions Ask for support from trusted family members or friends to make sure you stay on track with your treatment. Keep a journal to write down your daily  moods, medicines, sleep habits, and life events. This may help you have more success with your treatment. Make and follow a routine for daily meal times. Eat healthy foods, such as whole grains, vegetables, and fresh fruit. Try to go to sleep and wake up around the same time every day. Keep all follow-up visits as told by your health care provider. This is important. Where to find support Talking to others Try making a list of the people you may want to tell about your condition, such as the people you trust most. Plan what you are willing and not willing to talk about. Think about your needs ahead of time, and how your friends and family members can support you. Let your loved ones know when they can share advice and when you would just like them to listen. Give your loved ones information about bipolar disorder, and encourage them to  learn about the condition. Finances Not all insurance plans cover mental health care, so it is important to check with your insurance carrier. If paying for co-pays or counseling services is a problem, search for a local or county mental health care center. Public mental health care services may be offered there at a low cost or no cost when you are not able to see a private health care provider. If you are taking medicine for depression, you may be able to get the generic form, which may be less expensive than brand-name medicine. Some makers of prescription medicines also offer help to patients who cannot afford the medicines they need. Questions to ask your health care provider: If you are taking medicines: How long do I need to take medicine? Are there any long-term side effects of my medicine? Are there any alternatives to taking medicine? How would I benefit from therapy? How often should I follow up with a health care provider? Contact a health care provider if: Your symptoms get worse or they do not get better with treatment. Get help right away if: You  have thoughts about harming yourself or others. If you ever feel like you may hurt yourself or others, or have thoughts about taking your own life, get help right away. You can go to your nearest emergency department or call: Your local emergency services (911 in the [place] A suicide crisis helpline, such as the National Suicide Prevention Lifeline at 782 770 8189. This is open 24-hours a day. Summary Learning ways to manage stress can help to calm you and may also help your treatment work better. There is a wide range of medicines that can help to treat bipolar disorder. Having healthy relationships can help to make your moods more stable. Contact a health care provider if your symptoms get worse or they do not get better with treatment. This information is not intended to replace advice given to you by your health care provider. Make sure you discuss any questions you have with your health care provider. Document Revised: 04/15/2020 Document Reviewed: 05/18/2020 Elsevier Patient Education  2022 [employer].

## 2021-09-21 NOTE — Progress Notes (Signed)
[facility] Patient [facility] [address] [place], [place]  [address] Phone:  [phone]   Fax:  [phone]   New Patient Office Visit  Subjective:  Patient ID: Holly Roberts, female    DOB: 1966/07/10  Age: 55 y.o. MRN: [id number]  CC:  Chief Complaint  Patient presents with   Establish Care    Pt is here to establish care. Pt also needs refills on some of her medications.     HPI Holly Roberts presents to establish care. She  has a past medical history of Anxiety, Bipolar disorder (HCC), Chronic pain, Depression, and Hypertension.   She is being followed by pain management for chronic pain.  She is follow by psychiatrist for her bipolar.  She was admitted for metabolic acidosis due to prolonged N/V post lap coly. She was diagnosed with HTN and started on Amlodipine and metoprolol. She deneis any side effects. She denies previous PCP.     Past Medical History:  Diagnosis Date   Anxiety    Bipolar disorder (HCC)    Chronic pain    Depression    Hypertension    patient denies    Past Surgical History:  Procedure Laterality Date   CHOLECYSTECTOMY N/A 04/20/2021   Procedure: LAPAROSCOPIC CHOLECYSTECTOMY WITH INTRAOPERATIVE CHOLANGIOGRAM;  Surgeon: [name], MD;  Location: [facility];  Service: General;  Laterality: N/A;   Right knee surgery     TONSILLECTOMY      Family History  Problem Relation Age of Onset   Cancer Father     Social History   Socioeconomic History   Marital status: Single    Spouse name: Not on file   Number of children: Not on file   Years of education: Not on file   Highest education level: Not on file  Occupational History   Not on file  Tobacco Use   Smoking status: Never   Smokeless tobacco: Never  Vaping Use   Vaping Use: Never used  Substance and Sexual Activity   Alcohol use: Yes    Comment: rarely   Drug use: Never   Sexual activity: Not Currently  Other Topics Concern   Not on file  Social History Narrative    Not on file   Social Determinants of Health   Financial Resource Strain: Not on file  Food Insecurity: Not on file  Transportation Needs: Not on file  Physical Activity: Not on file  Stress: Not on file  Social Connections: Not on file  Intimate Partner Violence: Not on file    ROS Review of Systems  Objective:   Today's Vitals: BP 126/73   Pulse 90   Temp 98.6 F (37 C)   Ht 5\' 4"  (1.626 m)   Wt 246 lb 9.6 oz (111.9 kg)   SpO2 98%   BMI 42.33 kg/m   Physical Exam Constitutional:      General: She is not in acute distress.    Appearance: She is obese.  HENT:     Head: Normocephalic and atraumatic.     Right Ear: Tympanic membrane normal.     Left Ear: Tympanic membrane normal.     Nose: Nose normal.     Mouth/Throat:     Mouth: Mucous membranes are moist.  Cardiovascular:     Rate and Rhythm: Normal rate and regular rhythm.     Pulses: Normal pulses.     Heart sounds: Normal heart sounds.  Pulmonary:     Effort: Pulmonary effort is  normal.     Breath sounds: Normal breath sounds.  Abdominal:     Palpations: Abdomen is soft. There is no mass.     Tenderness: There is no abdominal tenderness.     Hernia: No hernia is present.  Musculoskeletal:        General: Normal range of motion.     Cervical back: Normal range of motion.  Skin:    General: Skin is warm and dry.     Capillary Refill: Capillary refill takes less than 2 seconds.  Neurological:     General: No focal deficit present.     Mental Status: She is alert and oriented to person, place, and time.  Psychiatric:        Mood and Affect: Mood normal.        Behavior: Behavior normal.        Thought Content: Thought content normal.        Judgment: Judgment normal.    Assessment & Plan:   Problem List Items Addressed This Visit       Cardiovascular and Mediastinum   Benign essential HTN (Chronic) Encouraged on going compliance with current medication regimen Encouraged home monitoring and  recording BP <130/80 Eating a heart-healthy diet with less salt Encouraged regular physical activity  Recommend Weight loss     Relevant Medications   amLODipine (NORVASC) 10 MG tablet   metoprolol tartrate (LOPRESSOR) 50 MG tablet   cholestyramine light (PREVALITE) 4 g packet     Digestive   Bile acid malabsorption syndrome   Relevant Medications   cholestyramine light (PREVALITE) 4 g packet     Endocrine   Hypoglycemia without diagnosis of diabetes mellitus - Primary   Relevant Orders   HgB A1c (Completed)     Nervous and Auditory   Acute encephalopathy   Relevant Medications   ondansetron (ZOFRAN ODT) 4 MG disintegrating tablet   Other Visit Diagnoses     Encounter to establish care     Discussed female health maintenance; SBE, annual CBE, PAP test Discussed general safety in vehicle and COVID Discussed regular hydration with water Discussed healthy diet and exercise and weight management Discussed sexual health  Discussed mental health Encouraged to call our office for an appointment with in ongoing concerns for questions.     Gastroesophageal reflux disease without esophagitis       Relevant Medications   pantoprazole (PROTONIX) 40 MG tablet   ondansetron (ZOFRAN ODT) 4 MG disintegrating tablet   Flu vaccine need       Relevant Orders   Flu Vaccine QUAD 20mo+IM (Fluarix, Fluzone & Alfiuria Quad PF) (Completed)       Outpatient Encounter Medications as of 09/21/2021  Medication Sig   Armodafinil 250 MG tablet Take 250 mg by mouth every morning.   Ensure (ENSURE) Take 237 mLs by mouth 2 (two) times daily.   folic acid (FOLVITE) 1 MG tablet Take 1 tablet (1 mg total) by mouth daily.   HORIZANT 600 MG TBCR Take 600-1,200 mg by mouth in the morning and at bedtime. Take 1 tablet (600 mg) in the morning and Take 2 tablets (1200 mg) at bedtime   hydrOXYzine (ATARAX/VISTARIL) 10 MG tablet Take 1 tablet (10 mg total) by mouth 3 (three) times daily as needed.   Multiple  Vitamin (MULTIVITAMIN WITH MINERALS) TABS tablet Take 1 tablet by mouth daily.   NUCYNTA 100 MG TABS Take 2 tablets (200 mg total) by mouth daily in the afternoon.   NUCYNTA ER  200 MG TB12 Take 200 mg by mouth 2 (two) times daily.   TRINTELLIX 20 MG TABS tablet Take 20 mg by mouth daily.   vitamin B-12 (CYANOCOBALAMIN) 100 MCG tablet Take 1 tablet (100 mcg total) by mouth daily.   VRAYLAR 6 MG CAPS Take 6 mg by mouth daily.   [DISCONTINUED] amLODipine (NORVASC) 10 MG tablet Take 1 tablet (10 mg total) by mouth daily.   [DISCONTINUED] cholestyramine light (PREVALITE) 4 g packet Take 1 packet (4 g total) by mouth 2 (two) times daily.   [DISCONTINUED] folic acid (FOLVITE) 1 MG tablet Take 1 tablet (1 mg total) by mouth daily.   [DISCONTINUED] metoprolol tartrate (LOPRESSOR) 50 MG tablet Take 1 tablet (50 mg total) by mouth 2 (two) times daily.   [DISCONTINUED] ondansetron (ZOFRAN ODT) 4 MG disintegrating tablet Take 1 tablet (4 mg total) by mouth every 8 (eight) hours as needed for nausea or vomiting.   [DISCONTINUED] pantoprazole (PROTONIX) 40 MG tablet Take 1 tablet (40 mg total) by mouth daily.   [DISCONTINUED] thiamine 100 MG tablet Take 1 tablet (100 mg total) by mouth daily.   acetaminophen (TYLENOL) 325 MG tablet Take 650 mg by mouth as needed. (Patient not taking: Reported on 09/21/2021)   amLODipine (NORVASC) 10 MG tablet Take 1 tablet (10 mg total) by mouth daily.   aspirin-acetaminophen-caffeine (EXCEDRIN MIGRAINE) 250-250-65 MG tablet Take 1 tablet by mouth every 6 (six) hours as needed for headache. (Patient not taking: Reported on 09/21/2021)   cholestyramine light (PREVALITE) 4 g packet Take 1 packet (4 g total) by mouth 2 (two) times daily.   doxepin (SINEQUAN) 50 MG capsule Take 1 capsule (50 mg total) by mouth at bedtime. (Patient not taking: Reported on 09/21/2021)   loperamide (IMODIUM) 2 MG capsule Take 1 capsule (2 mg total) by mouth every 6 (six) hours as needed for diarrhea or  loose stools. (Patient not taking: Reported on 09/21/2021)   melatonin 3 MG TABS tablet Take 1 tablet (3 mg total) by mouth at bedtime. (Patient not taking: Reported on 09/21/2021)   metoprolol tartrate (LOPRESSOR) 50 MG tablet Take 1 tablet (50 mg total) by mouth 2 (two) times daily.   NON FORMULARY Diet: Heat healthy diet with thin liquids   nystatin powder Apply 1 application topically 2 (two) times daily. (Patient not taking: Reported on 09/21/2021)   ondansetron (ZOFRAN ODT) 4 MG disintegrating tablet Take 1 tablet (4 mg total) by mouth every 8 (eight) hours as needed for nausea or vomiting.   pantoprazole (PROTONIX) 40 MG tablet Take 1 tablet (40 mg total) by mouth daily.   thiamine 100 MG tablet Take 1 tablet (100 mg total) by mouth daily for 365 doses.   No facility-administered encounter medications on file as of 09/21/2021.    Follow-up: Return in about 4 weeks (around 10/19/2021) for Follow-up for anxiety 99213 virtual visit then 3 mon HTn.   Barbette Merino, NP

## 2021-09-25 ENCOUNTER — Telehealth: Payer: Self-pay

## 2021-09-25 NOTE — Telephone Encounter (Signed)
prevalite

## 2021-09-26 NOTE — Telephone Encounter (Signed)
Pt is aware of refills sent to the pharmacy. Pt also would like to know if her appt on 10/19/21 can be a virtual visit due to her hours at work and not having any PTO hrs to use.

## 2021-10-06 ENCOUNTER — Ambulatory Visit: Payer: BC Managed Care – PPO | Admitting: Nurse Practitioner

## 2021-10-19 ENCOUNTER — Ambulatory Visit: Payer: BC Managed Care – PPO | Admitting: Nurse Practitioner

## 2021-12-21 ENCOUNTER — Other Ambulatory Visit: Payer: Self-pay

## 2021-12-21 ENCOUNTER — Encounter (HOSPITAL_COMMUNITY): Payer: Self-pay

## 2021-12-21 ENCOUNTER — Inpatient Hospital Stay (HOSPITAL_COMMUNITY)
Admission: EM | Admit: 2021-12-21 | Discharge: 2021-12-25 | DRG: 392 | Disposition: A | Discharge status: Home / Self Care | Payer: BC Managed Care – PPO | Attending: Internal Medicine | Admitting: Internal Medicine

## 2021-12-21 ENCOUNTER — Emergency Department (HOSPITAL_COMMUNITY): Payer: BC Managed Care – PPO

## 2021-12-21 DIAGNOSIS — G9340 Encephalopathy, unspecified: Secondary | ICD-10-CM

## 2021-12-21 DIAGNOSIS — Z20822 Contact with and (suspected) exposure to covid-19: Secondary | ICD-10-CM | POA: Diagnosis present

## 2021-12-21 DIAGNOSIS — E876 Hypokalemia: Secondary | ICD-10-CM

## 2021-12-21 DIAGNOSIS — R197 Diarrhea, unspecified: Secondary | ICD-10-CM

## 2021-12-21 DIAGNOSIS — I248 Other forms of acute ischemic heart disease: Secondary | ICD-10-CM | POA: Diagnosis present

## 2021-12-21 DIAGNOSIS — Z6841 Body Mass Index (BMI) 40.0 and over, adult: Secondary | ICD-10-CM

## 2021-12-21 DIAGNOSIS — R778 Other specified abnormalities of plasma proteins: Secondary | ICD-10-CM

## 2021-12-21 DIAGNOSIS — A0811 Acute gastroenteropathy due to Norwalk agent: Secondary | ICD-10-CM | POA: Diagnosis not present

## 2021-12-21 DIAGNOSIS — Z9049 Acquired absence of other specified parts of digestive tract: Secondary | ICD-10-CM

## 2021-12-21 DIAGNOSIS — R110 Nausea: Secondary | ICD-10-CM

## 2021-12-21 DIAGNOSIS — E860 Dehydration: Secondary | ICD-10-CM | POA: Diagnosis present

## 2021-12-21 DIAGNOSIS — F419 Anxiety disorder, unspecified: Secondary | ICD-10-CM | POA: Diagnosis present

## 2021-12-21 DIAGNOSIS — Z809 Family history of malignant neoplasm, unspecified: Secondary | ICD-10-CM

## 2021-12-21 DIAGNOSIS — R112 Nausea with vomiting, unspecified: Secondary | ICD-10-CM | POA: Diagnosis present

## 2021-12-21 DIAGNOSIS — R7989 Other specified abnormal findings of blood chemistry: Secondary | ICD-10-CM

## 2021-12-21 DIAGNOSIS — M4317 Spondylolisthesis, lumbosacral region: Secondary | ICD-10-CM | POA: Diagnosis present

## 2021-12-21 DIAGNOSIS — M47817 Spondylosis without myelopathy or radiculopathy, lumbosacral region: Secondary | ICD-10-CM | POA: Diagnosis present

## 2021-12-21 DIAGNOSIS — I10 Essential (primary) hypertension: Secondary | ICD-10-CM

## 2021-12-21 DIAGNOSIS — F319 Bipolar disorder, unspecified: Secondary | ICD-10-CM | POA: Diagnosis present

## 2021-12-21 DIAGNOSIS — M4306 Spondylolysis, lumbar region: Secondary | ICD-10-CM | POA: Diagnosis present

## 2021-12-21 DIAGNOSIS — R000 Tachycardia, unspecified: Secondary | ICD-10-CM | POA: Diagnosis not present

## 2021-12-21 DIAGNOSIS — R109 Unspecified abdominal pain: Secondary | ICD-10-CM | POA: Diagnosis not present

## 2021-12-21 DIAGNOSIS — G47411 Narcolepsy with cataplexy: Secondary | ICD-10-CM | POA: Diagnosis present

## 2021-12-21 DIAGNOSIS — G894 Chronic pain syndrome: Secondary | ICD-10-CM | POA: Diagnosis present

## 2021-12-21 DIAGNOSIS — Z79899 Other long term (current) drug therapy: Secondary | ICD-10-CM

## 2021-12-21 DIAGNOSIS — R159 Full incontinence of feces: Secondary | ICD-10-CM | POA: Diagnosis present

## 2021-12-21 DIAGNOSIS — K909 Intestinal malabsorption, unspecified: Secondary | ICD-10-CM | POA: Diagnosis present

## 2021-12-21 LAB — RESP PANEL BY RT-PCR (FLU A&B, COVID) ARPGX2
Influenza A by PCR: NEGATIVE
Influenza B by PCR: NEGATIVE
SARS Coronavirus 2 by RT PCR: NEGATIVE

## 2021-12-21 LAB — COMPREHENSIVE METABOLIC PANEL
ALT: 22 U/L (ref 0–44)
AST: 21 U/L (ref 15–41)
Albumin: 4.4 g/dL (ref 3.5–5.0)
Alkaline Phosphatase: 85 U/L (ref 38–126)
Anion gap: 11 (ref 5–15)
BUN: 16 mg/dL (ref 6–20)
CO2: 22 mmol/L (ref 22–32)
Calcium: 9.5 mg/dL (ref 8.9–10.3)
Chloride: 108 mmol/L (ref 98–111)
Creatinine, Ser: 0.71 mg/dL (ref 0.44–1.00)
GFR, Estimated: >60 mL/min (ref >60)
Glucose, Bld: 124 mg/dL — ABNORMAL HIGH (ref 70–99)
Potassium: 3.1 mmol/L — ABNORMAL LOW (ref 3.5–5.1)
Sodium: 141 mmol/L (ref 135–145)
Total Bilirubin: 0.7 mg/dL (ref 0.3–1.2)
Total Protein: 7.5 g/dL (ref 6.5–8.1)

## 2021-12-21 LAB — MAGNESIUM: Magnesium: 1.7 mg/dL (ref 1.7–2.4)

## 2021-12-21 LAB — CBC
HCT: 44.7 % (ref 36.0–46.0)
Hemoglobin: 15.1 g/dL — ABNORMAL HIGH (ref 12.0–15.0)
MCH: 29.7 pg (ref 26.0–34.0)
MCHC: 33.8 g/dL (ref 30.0–36.0)
MCV: 87.8 fL (ref 80.0–100.0)
Platelets: 211 10*3/uL (ref 150–400)
RBC: 5.09 MIL/uL (ref 3.87–5.11)
RDW: 11.9 % (ref 11.5–15.5)
WBC: 8.9 10*3/uL (ref 4.0–10.5)
nRBC: 0 % (ref 0.0–0.2)

## 2021-12-21 LAB — LIPASE, BLOOD: Lipase: 42 U/L (ref 11–51)

## 2021-12-21 LAB — TROPONIN I (HIGH SENSITIVITY): Troponin I (High Sensitivity): 48 ng/L — ABNORMAL HIGH (ref <18)

## 2021-12-21 MED ORDER — SODIUM CHLORIDE 0.9 % IV SOLN
6.2500 mg | Freq: Once | INTRAVENOUS | Status: AC
  Administered 2021-12-21: 6.25 mg via INTRAVENOUS
  Filled 2021-12-21: qty 0.25

## 2021-12-21 MED ORDER — ONDANSETRON HCL 4 MG/2ML IJ SOLN
4.0000 mg | Freq: Once | INTRAMUSCULAR | Status: AC
  Administered 2021-12-21: 4 mg via INTRAVENOUS
  Filled 2021-12-21: qty 2

## 2021-12-21 MED ORDER — SODIUM CHLORIDE 0.9 % IV SOLN: INTRAVENOUS | Status: DC

## 2021-12-21 MED ORDER — LORAZEPAM 2 MG/ML IJ SOLN
0.5000 mg | Freq: Once | INTRAMUSCULAR | Status: AC | PRN
  Administered 2021-12-21: 0.5 mg via INTRAVENOUS
  Filled 2021-12-21: qty 1

## 2021-12-21 MED ORDER — SODIUM CHLORIDE 0.9 % IV BOLUS
1000.0000 mL | Freq: Once | INTRAVENOUS | Status: AC
  Administered 2021-12-21: 1000 mL via INTRAVENOUS

## 2021-12-21 MED ORDER — POTASSIUM CHLORIDE 10 MEQ/100ML IV SOLN
10.0000 meq | INTRAVENOUS | Status: AC
  Administered 2021-12-21 – 2021-12-22 (×4): 10 meq via INTRAVENOUS
  Filled 2021-12-21 (×4): qty 100

## 2021-12-21 MED ORDER — ACETAMINOPHEN 650 MG RE SUPP: 650.0000 mg | Freq: Four times a day (QID) | RECTAL | Status: DC | PRN

## 2021-12-21 MED ORDER — ONDANSETRON HCL 4 MG/2ML IJ SOLN
4.0000 mg | Freq: Four times a day (QID) | INTRAMUSCULAR | Status: DC | PRN
  Administered 2021-12-22 – 2021-12-24 (×3): 4 mg via INTRAVENOUS
  Filled 2021-12-21 (×3): qty 2

## 2021-12-21 MED ORDER — POTASSIUM CHLORIDE 10 MEQ/100ML IV SOLN
10.0000 meq | Freq: Once | INTRAVENOUS | Status: AC
  Administered 2021-12-21: 10 meq via INTRAVENOUS
  Filled 2021-12-21: qty 100

## 2021-12-21 MED ORDER — RAMELTEON 8 MG PO TABS
8.0000 mg | ORAL_TABLET | Freq: Every evening | ORAL | Status: DC | PRN
  Filled 2021-12-21: qty 1

## 2021-12-21 MED ORDER — ACETAMINOPHEN 325 MG PO TABS: 650.0000 mg | ORAL_TABLET | Freq: Four times a day (QID) | ORAL | Status: DC | PRN

## 2021-12-21 MED ORDER — SODIUM CHLORIDE 0.9 % IV SOLN: Freq: Once | INTRAVENOUS | Status: AC

## 2021-12-21 MED ORDER — ENOXAPARIN SODIUM 60 MG/0.6ML IJ SOSY
60.0000 mg | PREFILLED_SYRINGE | INTRAMUSCULAR | Status: DC
  Administered 2021-12-21 – 2021-12-24 (×4): 60 mg via SUBCUTANEOUS
  Filled 2021-12-21 (×5): qty 0.6

## 2021-12-21 MED ORDER — MELATONIN 5 MG PO TABS
5.0000 mg | ORAL_TABLET | Freq: Every evening | ORAL | Status: DC | PRN
  Filled 2021-12-21: qty 1

## 2021-12-21 MED ORDER — IOHEXOL 350 MG/ML SOLN
80.0000 mL | Freq: Once | INTRAVENOUS | Status: AC | PRN
  Administered 2021-12-21: 80 mL via INTRAVENOUS

## 2021-12-21 NOTE — ED Triage Notes (Signed)
Per EMS- Patient c/o N/v/D, abdominal pain x 2 days.

## 2021-12-21 NOTE — ED Provider Triage Note (Signed)
Emergency Medicine Provider Triage Evaluation Note  Holly Roberts , a 56 y.o. female  was evaluated in triage.  Pt complains of abdominal pain. She states that same has been ongoing for the past 2 days. She states that it has been occurring intermittently since her cholecystectomy in May.  She states that she has been admitted to the hospital several times due to these symptoms.  She states that she has had more than 50 episodes of NBNB vomiting in the past 2 days.  She tried home Zofran without relief.  She denies fevers or chills.  She states that her abdominal pain is generalized throughout her abdomen and is the same as it is when she has these episodes.  Review of Systems  Positive:  Negative: See above   Physical Exam  BP (!) 143/120 (BP Location: Left Arm)    Pulse (!) 127    Temp (!) 97.5 F (36.4 C) (Oral)    Resp 18    Ht 5\' 4"  (1.626 m)    Wt 120.2 kg    SpO2 99%    BMI 45.49 kg/m  Gen:   Awake, no distress   Resp:  Normal effort  MSK:   Moves extremities without difficulty  Other:  Generalized mild tenderness throughout abdomen.  Medical Decision Making  Medically screening exam initiated at 9:58 AM.  Appropriate orders placed.  Holly Roberts was informed that the remainder of the evaluation will be completed by another provider, this initial triage assessment does not replace that evaluation, and the importance of remaining in the ED until their evaluation is complete.  Several previous CT abd pelvis in the past 8 months without acute findings. Workup initiated   Holly Roberts 12/21/21 1003

## 2021-12-21 NOTE — ED Notes (Signed)
Changed pt, pt had a bowel movement and was placed on a purwick for a urine sample.

## 2021-12-21 NOTE — H&P (Signed)
History and Physical    Holly Roberts HYQ:[id number] DOB: 1966-09-22 DOA: 12/21/2021  PCP: Barbette Merino, NP Patient coming from: Home  Chief Complaint: Nausea, vomiting, diarrhea  HPI: Holly Roberts is a 56 y.o. female with medical history significant of obesity (BMI 45.49), bipolar disease, anxiety, depression, primary narcolepsy with cataplexy, chronic pain syndrome, hypertension, acute cholecystitis status post lap cholecystectomy in May 2022 and postop course complicated by recurrent nausea/vomiting/diarrhea secondary to bile acid malabsorption syndrome presenting to the ED via EMS for evaluation of nausea, vomiting, diarrhea, and abdominal pain.  In the ED, tachycardic.  Not febrile or hypotensive.  Labs showing no leukocytosis.  Potassium 3.1.  Creatinine 0.7, stable.  Lipase and LFTs normal.  UA pending.  C. difficile PCR pending.  COVID and influenza PCR negative.  CT abdomen pelvis negative for acute finding.  Showing indeterminate 7 mm hypodensity in the left kidney, unchanged since 04/19/2021.  Stable small right renal angiomyolipoma.  Aortic atherosclerosis. Patient was given Zofran, IV potassium 10 mEq, Phenergan, and 2 L normal saline boluses.  Patient reports 2-day history of multiple episodes of nonbloody, nonbilious emesis and nonbloody watery diarrhea.  States she is constantly vomiting and has not been able to tolerate p.o. intake.  She has not vomited in the ED but has continued to have diarrhea.  Denies any recent sick contacts and she is fully vaccinated against COVID.  Denies recent antibiotic use.  Reports mild generalized abdominal discomfort only when vomiting but not otherwise.  Denies fevers, cough, shortness of breath, or chest pain.  No other complaints.  Review of Systems:  All systems reviewed and apart from history of presenting illness, are negative.  Past Medical History:  Diagnosis Date   Anxiety    Bipolar disorder (HCC)    Chronic pain    Depression     Hypertension    patient denies    Past Surgical History:  Procedure Laterality Date   CHOLECYSTECTOMY N/A 04/20/2021   Procedure: LAPAROSCOPIC CHOLECYSTECTOMY WITH INTRAOPERATIVE CHOLANGIOGRAM;  Surgeon: [name], MD;  Location: [facility];  Service: General;  Laterality: N/A;   Right knee surgery     TONSILLECTOMY       reports that she has never smoked. She has never used smokeless tobacco. She reports current alcohol use. She reports that she does not use drugs.  Allergies  Allergen Reactions   Buprenorphine Rash    Patch gave rash- poss adhesive issues. Tried generic and brand name both. Patient states: "I am not allergic to the medication. I am allergic to the adhesive, not the medication."    Amoxicillin-Pot Clavulanate     Other reaction(s): Other (See Comments) States "strong" antibiotics cause C diff   Wound Dressing Adhesive     Rash   Sulfa Antibiotics Rash    Family History  Problem Relation Age of Onset   Cancer Father     Prior to Admission medications   Medication Sig Start Date End Date Taking? Authorizing Provider  acetaminophen (TYLENOL) 325 MG tablet Take 650 mg by mouth as needed. Patient not taking: Reported on 09/21/2021    [provider]  amLODipine (NORVASC) 10 MG tablet Take 1 tablet (10 mg total) by mouth daily. 09/21/21 09/21/22  Barbette Merino, NP  Armodafinil 250 MG tablet Take 250 mg by mouth every morning. 04/09/21   [provider]  aspirin-acetaminophen-caffeine (EXCEDRIN MIGRAINE) [phone] MG tablet Take 1 tablet by mouth every 6 (six) hours as needed for headache. Patient not taking:  Reported on 09/21/2021    [provider]  cholestyramine light (PREVALITE) 4 g packet Take 1 packet (4 g total) by mouth 2 (two) times daily. 09/21/21 09/21/22  Barbette Merino, NP  diclofenac (VOLTAREN) 50 MG EC tablet Take 50 mg by mouth daily as needed. 10/08/21   [provider]  doxepin (SINEQUAN) 100 MG capsule Take  100 mg by mouth at bedtime. 12/18/21   [provider]  doxepin (SINEQUAN) 50 MG capsule Take 1 capsule (50 mg total) by mouth at bedtime. Patient not taking: Reported on 09/21/2021 08/10/21   Rai, Delene Ruffini, MD  Ensure (ENSURE) Take 237 mLs by mouth 2 (two) times daily.    [provider]  folic acid (FOLVITE) 1 MG tablet Take 1 tablet (1 mg total) by mouth daily. 09/21/21 09/21/22  Barbette Merino, NP  HORIZANT 600 MG TBCR Take 600-1,200 mg by mouth in the morning and at bedtime. Take 1 tablet (600 mg) in the morning and Take 2 tablets (1200 mg) at bedtime 05/29/21   [provider]  hydrOXYzine (ATARAX/VISTARIL) 10 MG tablet Take 1 tablet (10 mg total) by mouth 3 (three) times daily as needed. 09/21/21 09/21/22  Barbette Merino, NP  loperamide (IMODIUM) 2 MG capsule Take 1 capsule (2 mg total) by mouth every 6 (six) hours as needed for diarrhea or loose stools. Patient not taking: Reported on 09/21/2021 08/30/21   Medina-Vargas, Monina C, NP  melatonin 3 MG TABS tablet Take 1 tablet (3 mg total) by mouth at bedtime. Patient not taking: Reported on 09/21/2021 08/30/21   Medina-Vargas, Monina C, NP  metoprolol tartrate (LOPRESSOR) 50 MG tablet Take 1 tablet (50 mg total) by mouth 2 (two) times daily. 09/21/21 09/21/22  Barbette Merino, NP  Multiple Vitamin (MULTIVITAMIN WITH MINERALS) TABS tablet Take 1 tablet by mouth daily. 08/11/21   Rai, Delene Ruffini, MD  NON FORMULARY Diet: Heat healthy diet with thin liquids    [provider]  NUCYNTA 100 MG TABS Take 2 tablets (200 mg total) by mouth daily in the afternoon. 08/11/21   Medina-Vargas, Monina C, NP  NUCYNTA ER 200 MG TB12 Take 200 mg by mouth 2 (two) times daily. 08/11/21   Medina-Vargas, Monina C, NP  NUCYNTA ER 250 MG TB12 SMARTSIG:1 Tablet(s) By Mouth Every 12 Hours 12/07/21   [provider]  nystatin powder Apply 1 application topically 2 (two) times daily. Patient not taking: Reported on 09/21/2021 08/30/21    Medina-Vargas, Monina C, NP  ondansetron (ZOFRAN ODT) 4 MG disintegrating tablet Take 1 tablet (4 mg total) by mouth every 8 (eight) hours as needed for nausea or vomiting. 09/21/21   Barbette Merino, NP  pantoprazole (PROTONIX) 40 MG tablet Take 1 tablet (40 mg total) by mouth daily. 09/21/21 09/21/22  Barbette Merino, NP  QUVIVIQ 25 MG TABS Take 1 tablet by mouth daily. 12/08/21   [provider]  thiamine 100 MG tablet Take 1 tablet (100 mg total) by mouth daily for 365 doses. 09/21/21 09/21/22  Barbette Merino, NP  TRINTELLIX 20 MG TABS tablet Take 20 mg by mouth daily. 06/08/21   [provider]  vitamin B-12 (CYANOCOBALAMIN) 100 MCG tablet Take 1 tablet (100 mcg total) by mouth daily. 09/21/21 09/21/22  Barbette Merino, NP  VRAYLAR 6 MG CAPS Take 6 mg by mouth daily. 07/11/21   [provider]    Physical Exam: Vitals:   12/21/21 1830 12/21/21 1941 12/21/21 2320 12/21/21  2326  BP: (!) 147/105 (!) 165/115 (!) 169/104   Pulse: (!) 105 79 (!) 121 (!) 116  Resp: 19 20 20  (!) 23  Temp:      TempSrc:      SpO2: 100% 93% 98% 98%  Weight:      Height:        Physical Exam Constitutional:      General: She is not in acute distress. HENT:     Head: Normocephalic and atraumatic.     Mouth/Throat:     Mouth: Mucous membranes are dry.  Eyes:     Extraocular Movements: Extraocular movements intact.     Conjunctiva/sclera: Conjunctivae normal.  Cardiovascular:     Rate and Rhythm: Regular rhythm. Tachycardia present.     Pulses: Normal pulses.  Pulmonary:     Effort: Pulmonary effort is normal. No respiratory distress.     Breath sounds: No wheezing or rales.  Abdominal:     General: Bowel sounds are normal. There is no distension.     Palpations: Abdomen is soft.     Tenderness: There is no abdominal tenderness. There is no guarding or rebound.  Musculoskeletal:        General: No swelling or tenderness.     Cervical back: Normal range of motion and neck supple.   Skin:    General: Skin is warm and dry.  Neurological:     General: No focal deficit present.     Mental Status: She is alert and oriented to person, place, and time.     Labs on Admission: I have personally reviewed following labs and imaging studies  CBC: Recent Labs  Lab 12/21/21 1012  WBC 8.9  HGB 15.1*  HCT 44.7  MCV 87.8  PLT 211   Basic Metabolic Panel: Recent Labs  Lab 12/21/21 1012 12/21/21 2133  NA 141  --   K 3.1*  --   CL 108  --   CO2 22  --   GLUCOSE 124*  --   BUN 16  --   CREATININE 0.71  --   CALCIUM 9.5  --   MG  --  1.7   GFR: Estimated Creatinine Clearance: 101.5 mL/min (by C-G formula based on SCr of 0.71 mg/dL). Liver Function Tests: Recent Labs  Lab 12/21/21 1012  AST 21  ALT 22  ALKPHOS 85  BILITOT 0.7  PROT 7.5  ALBUMIN 4.4   Recent Labs  Lab 12/21/21 1012  LIPASE 42   No results for input(s): AMMONIA in the last 168 hours. Coagulation Profile: No results for input(s): INR, PROTIME in the last 168 hours. Cardiac Enzymes: No results for input(s): CKTOTAL, CKMB, CKMBINDEX, TROPONINI in the last 168 hours. BNP (last 3 results) No results for input(s): PROBNP in the last 8760 hours. HbA1C: No results for input(s): HGBA1C in the last 72 hours. CBG: No results for input(s): GLUCAP in the last 168 hours. Lipid Profile: No results for input(s): CHOL, HDL, LDLCALC, TRIG, CHOLHDL, LDLDIRECT in the last 72 hours. Thyroid Function Tests: No results for input(s): TSH, T4TOTAL, FREET4, T3FREE, THYROIDAB in the last 72 hours. Anemia Panel: No results for input(s): VITAMINB12, FOLATE, FERRITIN, TIBC, IRON, RETICCTPCT in the last 72 hours. Urine analysis:    Component Value Date/Time   COLORURINE YELLOW 07/30/2021 1450   APPEARANCEUR CLEAR 07/30/2021 1450   LABSPEC 1.020 07/30/2021 1450   PHURINE 6.0 07/30/2021 1450   GLUCOSEU NEGATIVE 07/30/2021 1450   HGBUR MODERATE (A) 07/30/2021 1450   BILIRUBINUR  NEGATIVE 07/30/2021 1450    KETONESUR >80 (A) 07/30/2021 1450   PROTEINUR TRACE (A) 07/30/2021 1450   NITRITE NEGATIVE 07/30/2021 1450   LEUKOCYTESUR NEGATIVE 07/30/2021 1450    Radiological Exams on Admission: CT ABDOMEN PELVIS W CONTRAST  Result Date: 12/21/2021 CLINICAL DATA:  Nausea, vomiting, diarrhea, abdominal pain for 2 days EXAM: CT ABDOMEN AND PELVIS WITH CONTRAST TECHNIQUE: Multidetector CT imaging of the abdomen and pelvis was performed using the standard protocol following bolus administration of intravenous contrast. CONTRAST:  80mL OMNIPAQUE IOHEXOL 350 MG/ML SOLN COMPARISON:  07/30/2021, 04/19/2021 FINDINGS: Lower chest: No acute pleural or parenchymal lung disease. Hepatobiliary: No focal liver abnormality is seen. Status post cholecystectomy. No biliary dilatation. Pancreas: Unremarkable. No pancreatic ductal dilatation or surrounding inflammatory changes. Spleen: Normal in size without focal abnormality. Adrenals/Urinary Tract: 9 mm right renal angiomyolipoma unchanged. Indeterminate 7 mm hypodensity left kidney image 29/2. No urinary tract calculi or obstructive uropathy within either kidney. The adrenals are unremarkable. Bladder is grossly normal. Stomach/Bowel: No bowel obstruction or ileus. No bowel wall thickening or inflammatory change. Vascular/Lymphatic: Aortic atherosclerosis. No enlarged abdominal or pelvic lymph nodes. Reproductive: Uterus and bilateral adnexa are unremarkable. Other: No free fluid or free gas.  No abdominal wall hernia. Musculoskeletal: No acute or destructive bony lesions. 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 Severe spondylosis at the L5-S1 level unchanged. Reconstructed images demonstrate no additional findings. IMPRESSION: 1. No acute intra-abdominal or intrapelvic process. 2. Indeterminate 7 mm hypodensity left kidney, unchanged since 04/19/2021. If further evaluation is desired, dedicated nonemergent renal MRI could be considered. 3. Stable  small right renal angiomyolipoma. 4.  Aortic Atherosclerosis (ICD10-I70.0). Electronically Signed   By: Sharlet Salina M.D.   On: 12/21/2021 17:12    EKG: Independently reviewed.  Sinus tachycardia, slight ST abnormality in inferior and lateral leads.  No significant change since prior tracing.  Assessment/Plan Principal Problem:   Intractable nausea and vomiting Active Problems:   Diarrhea   Tachycardia   Hypokalemia   Elevated troponin   Intractable nausea and vomiting, diarrhea Likely secondary to bile acid malabsorption syndrome.  History of cholecystectomy in May 2022 and this problem has been recurrent since then.  Lipase and LFTs normal.  No fever or leukocytosis.  COVID and influenza PCR negative.  Abdominal exam benign.  CT abdomen pelvis negative for acute finding. -Resume cholestyramine when patient is able to tolerate p.o. intake.  Continue IV fluid hydration, antiemetic as needed.  C. difficile PCR and GI pathogen panel ordered, follow enteric precautions.  Sinus tachycardia Likely due to dehydration.  No fever or leukocytosis to suggest sepsis.  Heart rate in the 110s to 120s. -IV fluid hydration  Mild hypokalemia -Monitor potassium and magnesium levels, replenish as needed.  Elevated troponin EKG showing slight ST abnormality in inferior and lateral leads, possibly rate related.  Overall, EKG does not appear significantly changed since prior tracing.  High-sensitivity troponin slightly elevated at 48, likely due to demand ischemia.  ACS less likely as patient denies chest pain. -Cardiac monitoring, trend troponin  Hypertension -IV labetalol prn SBP >180  Anxiety, depression, bipolar disorder Primary narcolepsy with cataplexy Chronic pain syndrome -Resume home meds when patient is able to tolerate p.o. intake.  DVT prophylaxis: Lovenox Code Status: Full code Family Communication: No family available at this time. Disposition Plan: Status is: Observation  The  patient remains OBS appropriate and will d/c before 2 midnights.  Level of care: Level of care: Telemetry  The  medical decision making on this patient was of high complexity and the patient is at high risk for clinical deterioration, therefore this is a level 3 visit.  [name] MD [facility]  If 7PM-7AM, please contact night-coverage [email or web address]  12/22/2021, 12:55 AM

## 2021-12-21 NOTE — ED Provider Notes (Signed)
[facility] DEPT Provider Note   CSN: [id number] Arrival date & time: 12/21/21  [id number]     History  Chief Complaint  Patient presents with   Abdominal Pain   Emesis   Diarrhea    Holly Roberts is a 56 y.o. female.  56 year old female with prior medical history as detailed below presents for evaluation.  Patient complains of persistent nausea, vomiting, diarrhea over the last 2 days.  She complains of diffuse abdominal cramping discomfort as well.  She reports similar symptoms in the past.  She has been attempting oral antiemetics at home without improvement.  She denies bloody emesis or blood in stool.  She describes loose watery stools.  She is incontinent of stool at time of exam.    The history is provided by the patient.  Abdominal Pain Pain location:  Generalized Pain quality: aching and cramping   Pain radiates to:  Does not radiate Pain severity:  Mild Onset quality:  Gradual Duration:  2 days Timing:  Constant Progression:  Waxing and waning Associated symptoms: diarrhea and vomiting   Emesis Associated symptoms: abdominal pain and diarrhea   Diarrhea Associated symptoms: abdominal pain and vomiting       Home Medications Prior to Admission medications   Medication Sig Start Date End Date Taking? Authorizing Provider  acetaminophen (TYLENOL) 325 MG tablet Take 650 mg by mouth as needed. Patient not taking: Reported on 09/21/2021    [provider]  amLODipine (NORVASC) 10 MG tablet Take 1 tablet (10 mg total) by mouth daily. 09/21/21 09/21/22  Vevelyn Francois, NP  Armodafinil 250 MG tablet Take 250 mg by mouth every morning. 04/09/21   [provider]  aspirin-acetaminophen-caffeine (EXCEDRIN MIGRAINE) [phone] MG tablet Take 1 tablet by mouth every 6 (six) hours as needed for headache. Patient not taking: Reported on 09/21/2021    [provider]  cholestyramine light (PREVALITE) 4 g packet Take 1 packet (4  g total) by mouth 2 (two) times daily. 09/21/21 09/21/22  Vevelyn Francois, NP  doxepin (SINEQUAN) 50 MG capsule Take 1 capsule (50 mg total) by mouth at bedtime. Patient not taking: Reported on 09/21/2021 08/10/21   Rai, Vernelle Emerald, MD  Ensure (ENSURE) Take 237 mLs by mouth 2 (two) times daily.    [provider]  folic acid (FOLVITE) 1 MG tablet Take 1 tablet (1 mg total) by mouth daily. 09/21/21 09/21/22  Vevelyn Francois, NP  HORIZANT 600 MG TBCR Take 600-1,200 mg by mouth in the morning and at bedtime. Take 1 tablet (600 mg) in the morning and Take 2 tablets (1200 mg) at bedtime 05/29/21   [provider]  hydrOXYzine (ATARAX/VISTARIL) 10 MG tablet Take 1 tablet (10 mg total) by mouth 3 (three) times daily as needed. 09/21/21 09/21/22  Vevelyn Francois, NP  loperamide (IMODIUM) 2 MG capsule Take 1 capsule (2 mg total) by mouth every 6 (six) hours as needed for diarrhea or loose stools. Patient not taking: Reported on 09/21/2021 08/30/21   Medina-Vargas, Monina C, NP  melatonin 3 MG TABS tablet Take 1 tablet (3 mg total) by mouth at bedtime. Patient not taking: Reported on 09/21/2021 08/30/21   Medina-Vargas, Monina C, NP  metoprolol tartrate (LOPRESSOR) 50 MG tablet Take 1 tablet (50 mg total) by mouth 2 (two) times daily. 09/21/21 09/21/22  Vevelyn Francois, NP  Multiple Vitamin (MULTIVITAMIN WITH MINERALS) TABS tablet Take 1 tablet by mouth daily. 08/11/21   Mendel Corning, MD  NON FORMULARY Diet: Heat healthy diet with thin liquids    [provider]  NUCYNTA 100 MG TABS Take 2 tablets (200 mg total) by mouth daily in the afternoon. 08/11/21   Medina-Vargas, Monina C, NP  NUCYNTA ER 200 MG TB12 Take 200 mg by mouth 2 (two) times daily. 08/11/21   Medina-Vargas, Monina C, NP  nystatin powder Apply 1 application topically 2 (two) times daily. Patient not taking: Reported on 09/21/2021 08/30/21   Medina-Vargas, Monina C, NP  ondansetron (ZOFRAN ODT) 4 MG disintegrating tablet Take 1 tablet  (4 mg total) by mouth every 8 (eight) hours as needed for nausea or vomiting. 09/21/21   Vevelyn Francois, NP  pantoprazole (PROTONIX) 40 MG tablet Take 1 tablet (40 mg total) by mouth daily. 09/21/21 09/21/22  Vevelyn Francois, NP  thiamine 100 MG tablet Take 1 tablet (100 mg total) by mouth daily for 365 doses. 09/21/21 09/21/22  Vevelyn Francois, NP  TRINTELLIX 20 MG TABS tablet Take 20 mg by mouth daily. 06/08/21   [provider]  vitamin B-12 (CYANOCOBALAMIN) 100 MCG tablet Take 1 tablet (100 mcg total) by mouth daily. 09/21/21 09/21/22  Vevelyn Francois, NP  VRAYLAR 6 MG CAPS Take 6 mg by mouth daily. 07/11/21   [provider]      Allergies    Buprenorphine, Amoxicillin-pot clavulanate, Wound dressing adhesive, and Sulfa antibiotics    Review of Systems   Review of Systems  Gastrointestinal:  Positive for abdominal pain, diarrhea and vomiting.  All other systems reviewed and are negative.  Physical Exam Updated Vital Signs BP (!) 168/102    Pulse (!) 136    Temp (!) 97.5 F (36.4 C) (Oral)    Resp 18    Ht 5\' 4"  (1.626 m)    Wt 120.2 kg    SpO2 96%    BMI 45.49 kg/m  Physical Exam Vitals and nursing note reviewed.  Constitutional:      General: She is not in acute distress.    Appearance: Normal appearance. She is well-developed.  HENT:     Head: Normocephalic and atraumatic.  Eyes:     Conjunctiva/sclera: Conjunctivae normal.     Pupils: Pupils are equal, round, and reactive to light.  Cardiovascular:     Rate and Rhythm: Normal rate and regular rhythm.     Heart sounds: Normal heart sounds.  Pulmonary:     Effort: Pulmonary effort is normal. No respiratory distress.     Breath sounds: Normal breath sounds.  Abdominal:     General: There is no distension.     Palpations: Abdomen is soft.     Tenderness: There is generalized abdominal tenderness.  Musculoskeletal:        General: No deformity. Normal range of motion.     Cervical back: Normal range of motion  and neck supple.  Skin:    General: Skin is warm and dry.  Neurological:     General: No focal deficit present.     Mental Status: She is alert and oriented to person, place, and time.    ED Results / Procedures / Treatments   Labs (all labs ordered are listed, but only abnormal results are displayed) Labs Reviewed  COMPREHENSIVE METABOLIC PANEL - Abnormal; Notable for the following components:      Result Value   Potassium 3.1 (*)    Glucose, Bld 124 (*)    All other components within normal limits  CBC - Abnormal; Notable  for the following components:   Hemoglobin 15.1 (*)    All other components within normal limits  LIPASE, BLOOD  URINALYSIS, ROUTINE W REFLEX MICROSCOPIC    EKG None  Radiology No results found.  Procedures Procedures    Medications Ordered in ED Medications  sodium chloride 0.9 % bolus 1,000 mL (has no administration in time range)  ondansetron (ZOFRAN) injection 4 mg (has no administration in time range)  potassium chloride 10 mEq in 100 mL IVPB (has no administration in time range)    ED Course/ Medical Decision Making/ A&P                           Medical Decision Making Patient presents with complaint of significant nausea with associated vomiting and significant volume of diarrhea.  Onset of symptoms was 2 days prior.  Patient reports that she is unable to keep anything down.  She also complains of diffuse crampy pain associated with same.  Patient with prior history of bile acid malabsorption syndrome after prior cholecystectomy.  Presentation is consistent with likely dehydration secondary to reported nausea, vomiting, and diarrhea.    Amount and/or Complexity of Data Reviewed External Data Reviewed: labs, radiology and notes.    Details: Prior ED evaluations and inpatient evaluations reviewed . Labs: ordered. Radiology: ordered.  Risk Decision regarding hospitalization.   This patient presents to the ED for concern of  nausea, vomiting, and diarrhea, this involves an extensive number of treatment options, and is a complaint that carries with it a high risk of complications and morbidity.  The differential diagnosis includes metabolic abnormality, intra-abdominal infection, Intra-abdominal obstruction, acute kidney injury, dehydration, etc.   Co morbidities that complicate the patient evaluation  Prior history of bile acid malabsorption syndrome, AKI, protein calorie malnutrition   Additional history obtained:   External records from outside source obtained and reviewed including prior inpatient work-up.  Prior outpatient evaluations.   Lab Tests:  I Ordered, and personally interpreted labs.     Imaging Studies ordered:  I ordered imaging studies including CT AP  I independently visualized and interpreted imaging which showed NAD I agree with the radiologist interpretation   Cardiac Monitoring:  The patient was maintained on a cardiac monitor.  I personally viewed and interpreted the cardiac monitored which showed an underlying rhythm of: Sinus tach   Medicines ordered and prescription drug management:  I ordered medication including IVF/Anitemetics  for dehydration/N/V  Reevaluation of the patient after these medicines showed that the patient improved      Consultations Obtained:  I requested consultation with the Hospitalist,  and discussed lab and imaging findings as well as pertinent plan. Will evaluate for admission  Problem List / ED Course:  N/V/D   Reevaluation:  After the interventions noted above, I reevaluated the patient and found that they have :improved     Dispostion:  After consideration of the diagnostic results and the patients response to treatment, I feel that the patent would benefit from admission.             Final Clinical Impression(s) / ED Diagnoses Final diagnoses:  Nausea  Nausea vomiting and diarrhea    Rx / DC Orders ED  Discharge Orders     None         Valarie Merino, MD 12/21/21 2030

## 2021-12-21 NOTE — ED Notes (Signed)
Patient said her purewick "slipped out and went onto the floor."

## 2021-12-21 NOTE — ED Notes (Signed)
Pt transported to CT ?

## 2021-12-22 DIAGNOSIS — M47817 Spondylosis without myelopathy or radiculopathy, lumbosacral region: Secondary | ICD-10-CM | POA: Diagnosis present

## 2021-12-22 DIAGNOSIS — K909 Intestinal malabsorption, unspecified: Secondary | ICD-10-CM | POA: Diagnosis present

## 2021-12-22 DIAGNOSIS — R112 Nausea with vomiting, unspecified: Secondary | ICD-10-CM | POA: Diagnosis not present

## 2021-12-22 DIAGNOSIS — Z9049 Acquired absence of other specified parts of digestive tract: Secondary | ICD-10-CM | POA: Diagnosis not present

## 2021-12-22 DIAGNOSIS — E876 Hypokalemia: Secondary | ICD-10-CM | POA: Diagnosis present

## 2021-12-22 DIAGNOSIS — R778 Other specified abnormalities of plasma proteins: Secondary | ICD-10-CM

## 2021-12-22 DIAGNOSIS — I248 Other forms of acute ischemic heart disease: Secondary | ICD-10-CM | POA: Diagnosis present

## 2021-12-22 DIAGNOSIS — Z79899 Other long term (current) drug therapy: Secondary | ICD-10-CM | POA: Diagnosis not present

## 2021-12-22 DIAGNOSIS — F419 Anxiety disorder, unspecified: Secondary | ICD-10-CM | POA: Diagnosis present

## 2021-12-22 DIAGNOSIS — Z809 Family history of malignant neoplasm, unspecified: Secondary | ICD-10-CM | POA: Diagnosis not present

## 2021-12-22 DIAGNOSIS — F319 Bipolar disorder, unspecified: Secondary | ICD-10-CM | POA: Diagnosis present

## 2021-12-22 DIAGNOSIS — Z6841 Body Mass Index (BMI) 40.0 and over, adult: Secondary | ICD-10-CM | POA: Diagnosis not present

## 2021-12-22 DIAGNOSIS — R159 Full incontinence of feces: Secondary | ICD-10-CM | POA: Diagnosis present

## 2021-12-22 DIAGNOSIS — R197 Diarrhea, unspecified: Secondary | ICD-10-CM

## 2021-12-22 DIAGNOSIS — A0811 Acute gastroenteropathy due to Norwalk agent: Secondary | ICD-10-CM | POA: Diagnosis present

## 2021-12-22 DIAGNOSIS — Z20822 Contact with and (suspected) exposure to covid-19: Secondary | ICD-10-CM | POA: Diagnosis present

## 2021-12-22 DIAGNOSIS — R000 Tachycardia, unspecified: Secondary | ICD-10-CM

## 2021-12-22 DIAGNOSIS — I10 Essential (primary) hypertension: Secondary | ICD-10-CM | POA: Diagnosis present

## 2021-12-22 DIAGNOSIS — G894 Chronic pain syndrome: Secondary | ICD-10-CM | POA: Diagnosis present

## 2021-12-22 DIAGNOSIS — G47411 Narcolepsy with cataplexy: Secondary | ICD-10-CM | POA: Diagnosis present

## 2021-12-22 DIAGNOSIS — R7989 Other specified abnormal findings of blood chemistry: Secondary | ICD-10-CM

## 2021-12-22 DIAGNOSIS — M4306 Spondylolysis, lumbar region: Secondary | ICD-10-CM | POA: Diagnosis present

## 2021-12-22 DIAGNOSIS — M4317 Spondylolisthesis, lumbosacral region: Secondary | ICD-10-CM | POA: Diagnosis present

## 2021-12-22 DIAGNOSIS — E860 Dehydration: Secondary | ICD-10-CM | POA: Diagnosis present

## 2021-12-22 DIAGNOSIS — R109 Unspecified abdominal pain: Secondary | ICD-10-CM | POA: Diagnosis present

## 2021-12-22 LAB — BASIC METABOLIC PANEL
Anion gap: 6 (ref 5–15)
BUN: 9 mg/dL (ref 6–20)
CO2: 20 mmol/L — ABNORMAL LOW (ref 22–32)
Calcium: 8.2 mg/dL — ABNORMAL LOW (ref 8.9–10.3)
Chloride: 115 mmol/L — ABNORMAL HIGH (ref 98–111)
Creatinine, Ser: 0.79 mg/dL (ref 0.44–1.00)
GFR, Estimated: >60 mL/min (ref >60)
Glucose, Bld: 115 mg/dL — ABNORMAL HIGH (ref 70–99)
Potassium: 3.4 mmol/L — ABNORMAL LOW (ref 3.5–5.1)
Sodium: 141 mmol/L (ref 135–145)

## 2021-12-22 LAB — URINALYSIS, ROUTINE W REFLEX MICROSCOPIC
Glucose, UA: NEGATIVE mg/dL
Ketones, ur: 5 mg/dL — AB
Nitrite: NEGATIVE
Protein, ur: 100 mg/dL — AB
Specific Gravity, Urine: 1.009 (ref 1.005–1.030)
WBC, UA: >50 WBC/hpf — ABNORMAL HIGH (ref 0–5)
pH: 8 (ref 5.0–8.0)

## 2021-12-22 LAB — TROPONIN I (HIGH SENSITIVITY): Troponin I (High Sensitivity): 42 ng/L — ABNORMAL HIGH (ref <18)

## 2021-12-22 MED ORDER — DOXEPIN HCL 50 MG PO CAPS
100.0000 mg | ORAL_CAPSULE | Freq: Every day | ORAL | Status: DC
  Administered 2021-12-23 – 2021-12-24 (×2): 100 mg via ORAL
  Filled 2021-12-22 (×3): qty 2

## 2021-12-22 MED ORDER — LABETALOL HCL 5 MG/ML IV SOLN: 5.0000 mg | INTRAVENOUS | Status: DC | PRN

## 2021-12-22 MED ORDER — ADULT MULTIVITAMIN W/MINERALS CH
1.0000 | ORAL_TABLET | Freq: Every day | ORAL | Status: DC
  Administered 2021-12-24 – 2021-12-25 (×2): 1 via ORAL
  Filled 2021-12-22 (×3): qty 1

## 2021-12-22 MED ORDER — SODIUM CHLORIDE 0.9 % IV SOLN
12.5000 mg | Freq: Three times a day (TID) | INTRAVENOUS | Status: DC | PRN
  Administered 2021-12-24: 12.5 mg via INTRAVENOUS
  Filled 2021-12-22 (×2): qty 0.5
  Filled 2021-12-22: qty 12.5

## 2021-12-22 MED ORDER — PANTOPRAZOLE SODIUM 40 MG IV SOLR
40.0000 mg | Freq: Two times a day (BID) | INTRAVENOUS | Status: DC
  Administered 2021-12-22 – 2021-12-24 (×5): 40 mg via INTRAVENOUS
  Filled 2021-12-22 (×6): qty 40

## 2021-12-22 MED ORDER — TAPENTADOL HCL ER 50 MG PO TB12
50.0000 mg | ORAL_TABLET | Freq: Two times a day (BID) | ORAL | Status: DC
  Administered 2021-12-23 – 2021-12-25 (×5): 50 mg via ORAL
  Filled 2021-12-22 (×5): qty 1

## 2021-12-22 MED ORDER — VORTIOXETINE HBR 20 MG PO TABS
20.0000 mg | ORAL_TABLET | Freq: Every day | ORAL | Status: DC
  Administered 2021-12-23 – 2021-12-25 (×3): 20 mg via ORAL
  Filled 2021-12-22 (×4): qty 1

## 2021-12-22 MED ORDER — AMLODIPINE BESYLATE 10 MG PO TABS
10.0000 mg | ORAL_TABLET | Freq: Every day | ORAL | Status: DC
  Administered 2021-12-23 – 2021-12-25 (×3): 10 mg via ORAL
  Filled 2021-12-22 (×3): qty 1

## 2021-12-22 MED ORDER — HYDROXYZINE HCL 10 MG PO TABS
10.0000 mg | ORAL_TABLET | Freq: Three times a day (TID) | ORAL | Status: DC | PRN
  Administered 2021-12-22 – 2021-12-24 (×3): 10 mg via ORAL
  Filled 2021-12-22 (×3): qty 1

## 2021-12-22 MED ORDER — CARIPRAZINE HCL 3 MG PO CAPS
6.0000 mg | ORAL_CAPSULE | Freq: Every day | ORAL | Status: DC
  Administered 2021-12-23 – 2021-12-25 (×3): 6 mg via ORAL
  Filled 2021-12-22: qty 2
  Filled 2021-12-22 (×2): qty 4
  Filled 2021-12-22: qty 2

## 2021-12-22 MED ORDER — POTASSIUM CHLORIDE 10 MEQ/100ML IV SOLN
10.0000 meq | INTRAVENOUS | Status: AC
  Administered 2021-12-22 (×3): 10 meq via INTRAVENOUS
  Filled 2021-12-22 (×3): qty 100

## 2021-12-22 MED ORDER — TAPENTADOL HCL 50 MG PO TABS
100.0000 mg | ORAL_TABLET | Freq: Every day | ORAL | Status: DC
  Administered 2021-12-23 – 2021-12-24 (×2): 100 mg via ORAL
  Filled 2021-12-22 (×2): qty 2

## 2021-12-22 MED ORDER — LABETALOL HCL 5 MG/ML IV SOLN: 10.0000 mg | INTRAVENOUS | Status: DC | PRN

## 2021-12-22 MED ORDER — METOPROLOL TARTRATE 50 MG PO TABS
50.0000 mg | ORAL_TABLET | Freq: Two times a day (BID) | ORAL | Status: DC
  Administered 2021-12-23 – 2021-12-25 (×5): 50 mg via ORAL
  Filled 2021-12-22 (×2): qty 1
  Filled 2021-12-22: qty 2
  Filled 2021-12-22 (×4): qty 1

## 2021-12-22 MED ORDER — CHOLESTYRAMINE LIGHT 4 G PO PACK
4.0000 g | PACK | Freq: Two times a day (BID) | ORAL | Status: DC
  Administered 2021-12-23 – 2021-12-25 (×5): 4 g via ORAL
  Filled 2021-12-22 (×8): qty 1

## 2021-12-22 MED ORDER — MODAFINIL 200 MG PO TABS
200.0000 mg | ORAL_TABLET | Freq: Every day | ORAL | Status: DC
  Administered 2021-12-24: 200 mg via ORAL
  Filled 2021-12-22: qty 1

## 2021-12-22 MED ORDER — TAPENTADOL HCL ER 250 MG PO TB12: 250.0000 mg | ORAL_TABLET | Freq: Two times a day (BID) | ORAL | Status: DC

## 2021-12-22 MED ORDER — GABAPENTIN 400 MG PO CAPS
1200.0000 mg | ORAL_CAPSULE | Freq: Every day | ORAL | Status: DC
  Administered 2021-12-22 – 2021-12-24 (×3): 1200 mg via ORAL
  Filled 2021-12-22 (×3): qty 3

## 2021-12-22 MED ORDER — GABAPENTIN 300 MG PO CAPS
600.0000 mg | ORAL_CAPSULE | Freq: Every day | ORAL | Status: DC
  Administered 2021-12-23 – 2021-12-25 (×3): 600 mg via ORAL
  Filled 2021-12-22 (×3): qty 2

## 2021-12-22 MED ORDER — THIAMINE HCL 100 MG PO TABS
100.0000 mg | ORAL_TABLET | Freq: Every day | ORAL | Status: DC
  Administered 2021-12-23 – 2021-12-25 (×3): 100 mg via ORAL
  Filled 2021-12-22 (×4): qty 1

## 2021-12-22 MED ORDER — ARMODAFINIL 250 MG PO TABS: 250.0000 mg | ORAL_TABLET | Freq: Every morning | ORAL | Status: DC

## 2021-12-22 MED ORDER — FOLIC ACID 1 MG PO TABS
1.0000 mg | ORAL_TABLET | Freq: Every day | ORAL | Status: DC
  Administered 2021-12-23 – 2021-12-25 (×3): 1 mg via ORAL
  Filled 2021-12-22 (×4): qty 1

## 2021-12-22 MED ORDER — TAPENTADOL HCL ER 100 MG PO TB12
200.0000 mg | ORAL_TABLET | Freq: Two times a day (BID) | ORAL | Status: DC
Start: 2021-12-23 — End: 2021-12-25
  Administered 2021-12-23 – 2021-12-25 (×5): 200 mg via ORAL
  Filled 2021-12-22 (×5): qty 2

## 2021-12-22 MED ORDER — MAGNESIUM SULFATE 2 GM/50ML IV SOLN
2.0000 g | Freq: Once | INTRAVENOUS | Status: AC
  Administered 2021-12-22: 2 g via INTRAVENOUS
  Filled 2021-12-22: qty 50

## 2021-12-22 MED ORDER — POTASSIUM CHLORIDE 2 MEQ/ML IV SOLN
INTRAVENOUS | Status: DC
  Filled 2021-12-22 (×10): qty 1000

## 2021-12-22 NOTE — ED Notes (Signed)
This nurse entered the pt's room and found the pt to be partially attached to cardiac monitor, with IV's infusing peripherally, unclothed, sitting on the end of the stretcher, yelling out for someone to help her despite call light being within visible reach attached to ED stretcher. Pt was detached from cardiac monitor and IV's, and was able to stand on her own, to use bedside commode. Linens were changed, since pt soiled ED stretcher, although able to use bedside commode. Pt given new mesh underwear and clean gown. Pt able to return to ED stretcher on her own. Pt re-attached to cardiac monitor x3. HR tachy at this time. VS are otherwise stable. Pt educated on call-light use and the importance of not exiting the ED stretcher, as well as remaining attached to cardiac monitor given the medications she is receiving at this time. Pt additionally educated on the importance of keeping pt mask on her face.

## 2021-12-22 NOTE — Progress Notes (Addendum)
PROGRESS NOTE   Holly Roberts  ZOX:[id number]    DOB: Apr 28, 1966    DOA: 12/21/2021  PCP: Barbette Merino, NP   I have briefly reviewed patients previous medical records in [facility].  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  CT abdomen and pelvis negative for acute findings.  Admitted for suspected bile acid malabsorption syndrome with intractable nausea, vomiting and diarrhea.  Supportive care.   Assessment & Plan:  Principal Problem:   Intractable nausea and vomiting Active Problems:   Diarrhea   Tachycardia   Hypokalemia   Elevated troponin   Intractable nausea and vomiting, diarrhea Likely secondary to bile acid malabsorption syndrome.   History of cholecystectomy in May 2022 and this problem has been recurrent since then.   Lipase and LFTs normal.   No fever or leukocytosis.  COVID and influenza PCR negative.  CT abdomen pelvis negative for acute finding. Resume cholestyramine when patient is able to tolerate p.o. intake.  Treating supportively with aggressive IV fluids with potassium supplements, as needed IV Zofran/Phenergan (may need to switch antiemetics to scheduled if continues to have vomiting), twice daily IV PPI, clear liquids and advance diet as tolerated.  C. difficile PCR and GI pathogen panel ordered, follow enteric precautions.  Went by to ED this afternoon, discussed with ED RN, despite patient ongoing stooling, samples have not been sent, requested that they be  sent. If does not improve, we will have to consider GI consultation.   Sinus tachycardia Suspect due to dehydration.  No clinical concern for infectious etiology. Aggressive IV fluids and monitor.   Mild hypokalemia Replace aggressively IV and monitor.  Also replacing IV magnesium.   Elevated troponin EKG showing slight ST abnormality in inferior and lateral leads, possibly rate related.  Overall, EKG does not appear significantly changed since prior tracing.   High-sensitivity troponin slightly elevated at 48 >42, likely due to demand ischemia.  ACS less likely as patient denies chest pain.   Essential hypertension Resume home amlodipine and metoprolol as tolerated. As needed labetalol if unable to tolerate oral meds.   Anxiety, depression, bipolar disorder Primary narcolepsy with cataplexy Chronic pain syndrome -Resume home meds when patient is able to tolerate p.o. intake There is significant duplication and polypharmacy on her home meds.  Reviewed her home meds carefully with the pharmacist and resumed as able.  Indeterminate 7 mm left kidney hypodensity Noted on CT abdomen 1/5.  Unchanged since 04/19/2021.  As per radiology recommendations, dedicated nonemergent renal MRI could be considered, this could be achieved as outpatient.  Body mass index is 45.49 kg/m./Morbid obesity.    DVT prophylaxis:   Lovenox   Code Status: Full Code Family Communication: None at bedside Disposition:  Status is: Observation  The patient will require care spanning > 2 midnights and should be moved to inpatient because: Unable to tolerate oral intake, need for IV fluids, careful monitoring and management.       Consultants:   None  Procedures:   None  Antimicrobials:   None   Subjective:  Seen in the  ED along with a nurse tech chaperone in the room.  Reports not feeling much better.  States that she had "50 episodes of diarrhea" since hospital arrival, loose and watery.  Some  abdominal pain.  Also nausea with vomiting, unclear when she last vomited.  Has not had anything to eat or drink in the last 3 days.  Denies fever or chills.  Objective:   Vitals:   12/22/21 0241 12/22/21 0540 12/22/21 0600 12/22/21 0841  BP:  (!) 152/93 (!) 163/97 (!) 107/91  Pulse: (!) 114 (!) 105 (!) 109 (!) 115  Resp: (!) 26 (!) 23 19 19   Temp:    98.7 F (37.1 C)  TempSrc:    Oral  SpO2: 94% 95% 99% 98%  Weight:      Height:        General exam: Young female, moderately built and morbidly obese, lying propped up in bed, somewhat ill looking.  Oral mucosa dry. Respiratory system: Clear to auscultation. Respiratory effort normal. Cardiovascular system: S1 & S2 heard, RRR. No JVD, murmurs, rubs, gallops or clicks. No pedal edema. Gastrointestinal system: Abdomen is nondistended, soft and nontender. No organomegaly or masses felt. Normal bowel sounds heard. Central nervous system: Alert and oriented. No focal neurological deficits. Extremities: Symmetric 5 x 5 power. Skin: No rashes, lesions or ulcers Psychiatry: Judgement and insight appear normal. Mood & affect appropriate.     Data Reviewed:   I have personally reviewed following labs and imaging studies   CBC: Recent Labs  Lab 12/21/21 1012  WBC 8.9  HGB 15.1*  HCT 44.7  MCV 87.8  PLT 211    Basic Metabolic Panel: Recent Labs  Lab 12/21/21 1012 12/21/21 2133 12/22/21 0235  NA 141  --  141  K 3.1*  --  3.4*  CL 108  --  115*  CO2 22  --  20*  GLUCOSE 124*  --  115*  BUN 16  --  9  CREATININE 0.71  --  0.79  CALCIUM 9.5  --  8.2*  MG  --  1.7  --     Liver Function Tests: Recent Labs  Lab 12/21/21 1012  AST 21  ALT 22  ALKPHOS 85  BILITOT 0.7  PROT 7.5  ALBUMIN 4.4    CBG: No results for input(s): GLUCAP in the last 168 hours.  Microbiology Studies:   Recent Results (from the past 240 hour(s))  Resp Panel by RT-PCR (Flu A&B, Covid) Nasopharyngeal Swab     Status: None   Collection  Time: 12/21/21  6:06 PM   Specimen: Nasopharyngeal Swab; Nasopharyngeal(NP) swabs in vial transport medium  Result Value Ref Range Status   SARS Coronavirus 2 by RT PCR NEGATIVE NEGATIVE Final    Comment: (NOTE) SARS-CoV-2 target nucleic acids are NOT DETECTED.  The SARS-CoV-2 RNA is generally detectable in upper respiratory specimens during the acute phase of infection. The lowest concentration of SARS-CoV-2 viral copies this assay can detect is 138 copies/mL. A negative result does not preclude SARS-Cov-2 infection and should not be used as the sole basis for treatment or other patient management decisions. A negative result may occur with  improper specimen collection/handling, submission of specimen other than nasopharyngeal swab, presence of viral mutation(s) within the areas targeted by this assay, and inadequate number of viral copies(<138 copies/mL). A negative result must be combined with clinical observations, patient history, and epidemiological information. The expected result is Negative.  Fact Sheet for Patients:  BloggerCourse.com  Fact Sheet  for Healthcare Providers:  SeriousBroker.it  This test is no t yet approved or cleared by the [place] and  has been authorized for detection and/or diagnosis of SARS-CoV-2 by FDA under an Emergency Use Authorization (EUA). This EUA will remain  in effect (meaning this test can be used) for the duration of the COVID-19 declaration under Section 564(b)(1) of the Act, 21 U.S.C.section 360bbb-3(b)(1), unless the authorization is terminated  or revoked sooner.       Influenza A by PCR NEGATIVE NEGATIVE Final   Influenza B by PCR NEGATIVE NEGATIVE Final    Comment: (NOTE) The Xpert Xpress SARS-CoV-2/FLU/RSV plus assay is intended as an aid in the diagnosis of influenza from Nasopharyngeal swab specimens and should not be used as a sole basis for treatment. Nasal washings  and aspirates are unacceptable for Xpert Xpress SARS-CoV-2/FLU/RSV testing.  Fact Sheet for Patients: BloggerCourse.com  Fact Sheet for Healthcare Providers: SeriousBroker.it  This test is not yet approved or cleared by the [place] FDA and has been authorized for detection and/or diagnosis of SARS-CoV-2 by FDA under an Emergency Use Authorization (EUA). This EUA will remain in effect (meaning this test can be used) for the duration of the COVID-19 declaration under Section 564(b)(1) of the Act, 21 U.S.C. section 360bbb-3(b)(1), unless the authorization is terminated or revoked.  Performed at [facility], [address] [place], [place] [address]     Radiology Studies:  CT ABDOMEN PELVIS W CONTRAST  Result Date: 12/21/2021 CLINICAL DATA:  Nausea, vomiting, diarrhea, abdominal pain for 2 days EXAM: CT ABDOMEN AND PELVIS WITH CONTRAST TECHNIQUE: Multidetector CT imaging of the abdomen and pelvis was performed using the standard protocol following bolus administration of intravenous contrast. CONTRAST:  80mL OMNIPAQUE IOHEXOL 350 MG/ML SOLN COMPARISON:  07/30/2021, 04/19/2021 FINDINGS: Lower chest: No acute pleural or parenchymal lung disease. Hepatobiliary: No focal liver abnormality is seen. Status post cholecystectomy. No biliary dilatation. Pancreas: Unremarkable. No pancreatic ductal dilatation or surrounding inflammatory changes. Spleen: Normal in size without focal abnormality. Adrenals/Urinary Tract: 9 mm right renal angiomyolipoma unchanged. Indeterminate 7 mm hypodensity left kidney image 29/2. No urinary tract calculi or obstructive uropathy within either kidney. The adrenals are unremarkable. Bladder is grossly normal. Stomach/Bowel: No bowel obstruction or ileus. No bowel wall thickening or inflammatory change. Vascular/Lymphatic: Aortic atherosclerosis. No enlarged abdominal or pelvic lymph nodes.  Reproductive: Uterus and bilateral adnexa are unremarkable. Other: No free fluid or free gas.  No abdominal wall hernia. Musculoskeletal: No acute or destructive bony lesions. 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 Severe spondylosis at the L5-S1 level unchanged. Reconstructed images demonstrate no additional findings. IMPRESSION: 1. No acute intra-abdominal or intrapelvic process. 2. Indeterminate 7 mm hypodensity left kidney, unchanged since 04/19/2021. If further evaluation is desired, dedicated nonemergent renal MRI could be considered. 3. Stable small right renal angiomyolipoma. 4.  Aortic Atherosclerosis (ICD10-I70.0). Electronically Signed   By: Sharlet Salina M.D.   On: 12/21/2021 17:12    Scheduled Meds:    amLODipine  10 mg Oral Daily   Armodafinil  250 mg Oral q morning   cholestyramine light  4 g Oral BID   doxepin  100 mg Oral QHS   enoxaparin (LOVENOX) injection  60 mg Subcutaneous Q24H   folic acid  1 mg Oral Daily   metoprolol tartrate  50 mg Oral BID   multivitamin with minerals  1 tablet Oral Daily   pantoprazole (PROTONIX) IV  40 mg Intravenous  Q12H   thiamine  100 mg Oral Daily    Continuous Infusions:    lactated ringers 1,000 mL with potassium chloride 40 mEq infusion     magnesium sulfate bolus IVPB     potassium chloride 10 mEq (12/22/21 9562)   promethazine (PHENERGAN) injection (IM or IVPB)       LOS: 0 days     Marcellus Scott, MD,  FACP, [facility], [facility], [facility] (Care Management Physician Certified) [facility] [place]  To contact the attending provider between 7A-7P or the covering provider during after hours 7P-7A, please log into the web site [email or web address] and access using universal [place] password for that web site. If you do not have the password, please call the hospital operator.  12/22/2021, 9:30 AM

## 2021-12-22 NOTE — ED Notes (Signed)
Patient refusing PO intake and medications stating she has been throwing them up. Patient has not attempted PO intake.

## 2021-12-22 NOTE — ED Notes (Addendum)
This nurse entered the pt's room to give morning medications PO. Pt states, "I don't want to" when asked if she can tolerate swallowing pills. She states she is "afraid she is going to throw them up." Pt has had no episodes of vomiting since this nurse has been on-shift this morning. Dr. Waymon Amato notified and aware. This nurse will hold pt's PO multi-vitamin, Thiamine, and Folic acid. Holding pt's Norvasc and 50mg  PO Metoprolol at this time as well, given pt's "softer" BP's of 108 systolic. Dr. notified, aware, and in agreement.

## 2021-12-22 NOTE — ED Notes (Addendum)
Pt readjusted in ED stretcher. Call light in reach. Pt declined clear-liquids tray at this time.

## 2021-12-22 NOTE — ED Notes (Addendum)
Pt bed was fully changed. Pt is is able to walk to the bedside commode.

## 2021-12-22 NOTE — ED Notes (Signed)
Purple man yet to be assigned. 4W notified.

## 2021-12-22 NOTE — ED Notes (Signed)
Pt's daughter reports pt needs to go to the [facility]. Pt received with gown entangled with IV and monitor cords. This Clinical research associate removed pt's gown and disconnected pt from the monitor. Pt able to perform pericare w/o assist. Upon returning to bed, pt agrees with not wearing a gown, to prevent being entangled again, and covering up with a blanket instead. RN aware.

## 2021-12-22 NOTE — ED Notes (Signed)
Pt able to get to [facility] w/o assist. Pt provided pericare to self independently.

## 2021-12-22 NOTE — ED Notes (Signed)
Pharmacy paged and aware to verify pt's LR with 47mEq.

## 2021-12-22 NOTE — ED Notes (Signed)
Patient cleaned up, new sheets applied. Purewick applied.

## 2021-12-22 NOTE — ED Notes (Signed)
This nurse entered the pt's room to find the pt had swung her  legs over the stretcher railing as well as had removed cardiac leads. This nurse re-adjusted pt in bed. Re-attached cardiac leads and again expressed to the pt the importance of using the call-light and keeping cardiac leads on.

## 2021-12-22 NOTE — ED Notes (Addendum)
Pt called out for restroom assistance. Upon entering room, pt has soiled the bed. Complete bed change completed by this Clinical research associate. Pt provided with wipes and new under garments. Pt required no assistance to bedside commode besides letting the bedside railing down. Pt returned to bedside. This writer instructed the pt to call out if further assistance was needed. Pt then asked to be covered with blankets. Will continue to monitor pt.

## 2021-12-23 LAB — GASTROINTESTINAL PANEL BY PCR, STOOL (REPLACES STOOL CULTURE)

## 2021-12-23 LAB — CBC
HCT: 38.9 % (ref 36.0–46.0)
Hemoglobin: 13.4 g/dL (ref 12.0–15.0)
MCH: 29.9 pg (ref 26.0–34.0)
MCHC: 34.4 g/dL (ref 30.0–36.0)
MCV: 86.8 fL (ref 80.0–100.0)
Platelets: 166 10*3/uL (ref 150–400)
RBC: 4.48 MIL/uL (ref 3.87–5.11)
RDW: 12 % (ref 11.5–15.5)
WBC: 8 10*3/uL (ref 4.0–10.5)
nRBC: 0 % (ref 0.0–0.2)

## 2021-12-23 LAB — BASIC METABOLIC PANEL
Anion gap: 13 (ref 5–15)
BUN: <5 mg/dL — ABNORMAL LOW (ref 6–20)
CO2: 15 mmol/L — ABNORMAL LOW (ref 22–32)
Calcium: 9.3 mg/dL (ref 8.9–10.3)
Chloride: 114 mmol/L — ABNORMAL HIGH (ref 98–111)
Creatinine, Ser: 0.81 mg/dL (ref 0.44–1.00)
GFR, Estimated: >60 mL/min (ref >60)
Glucose, Bld: 79 mg/dL (ref 70–99)
Potassium: 4.8 mmol/L (ref 3.5–5.1)
Sodium: 142 mmol/L (ref 135–145)

## 2021-12-23 LAB — C DIFFICILE QUICK SCREEN W PCR REFLEX
C Diff antigen: NEGATIVE
C Diff interpretation: NOT DETECTED
C Diff toxin: NEGATIVE

## 2021-12-23 MED ORDER — ORAL CARE MOUTH RINSE
15.0000 mL | Freq: Two times a day (BID) | OROMUCOSAL | Status: DC
  Administered 2021-12-24 (×2): 15 mL via OROMUCOSAL

## 2021-12-23 MED ORDER — LORAZEPAM 2 MG/ML IJ SOLN
0.5000 mg | Freq: Three times a day (TID) | INTRAMUSCULAR | Status: DC | PRN
  Administered 2021-12-23: 0.5 mg via INTRAVENOUS
  Filled 2021-12-23: qty 1

## 2021-12-23 MED ORDER — GERHARDT'S BUTT CREAM
TOPICAL_CREAM | Freq: Three times a day (TID) | CUTANEOUS | Status: DC
  Administered 2021-12-24: 1 via TOPICAL
  Filled 2021-12-23 (×2): qty 1

## 2021-12-23 MED ORDER — CHLORHEXIDINE GLUCONATE 0.12 % MT SOLN
15.0000 mL | Freq: Two times a day (BID) | OROMUCOSAL | Status: DC
  Administered 2021-12-23 – 2021-12-25 (×5): 15 mL via OROMUCOSAL
  Filled 2021-12-23 (×5): qty 15

## 2021-12-23 MED ORDER — ALPRAZOLAM 0.5 MG PO TABS
0.5000 mg | ORAL_TABLET | Freq: Every evening | ORAL | Status: DC | PRN
  Administered 2021-12-23: 0.5 mg via ORAL
  Filled 2021-12-23 (×2): qty 1

## 2021-12-23 NOTE — Progress Notes (Signed)
Patient consumed 50% of clear liquid diet (360 mL), tolerated well, no c/o of N/V/D.  Bradd Burner, RN

## 2021-12-23 NOTE — Consult Note (Signed)
Referring Provider: Dr. Carmelia Roller Primary Care Physician:  Barbette Merino, NP Primary Gastroenterologist:  Gentry Fitz  Reason for Consultation:  Intractable Nausea/Vomiting  HPI: Holly Roberts is a 56 y.o. 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 Patient has been having 2 days of intractable nausea/vomiting and diarrhea. C. Diff negative. GI pathogen panel pending. Denies any nausea right now and tolerating clear liquid diet. Sleeping soundly and difficult to arouse. Very somnolent. Denies known history of ulcers and denies previous EGD/colonoscopy. No acute findings on CT abd/pelvis.  Past Medical History:  Diagnosis Date   Anxiety    Bipolar disorder (HCC)    Chronic pain    Depression    Hypertension    patient denies    Past Surgical History:  Procedure Laterality Date   CHOLECYSTECTOMY N/A 04/20/2021   Procedure: LAPAROSCOPIC CHOLECYSTECTOMY WITH INTRAOPERATIVE CHOLANGIOGRAM;  Surgeon: [name], MD;  Location: [facility];  Service: General;  Laterality: N/A;   Right knee surgery     TONSILLECTOMY      Prior to Admission medications   Medication Sig Start Date End Date Taking? Authorizing Provider  amLODipine (NORVASC) 10 MG tablet Take 1 tablet (10 mg total) by mouth daily. 09/21/21 09/21/22 Yes King, Shana Chute, NP  Armodafinil 250 MG tablet Take 250 mg by mouth every morning. 04/09/21  Yes [provider]  cholestyramine light (PREVALITE) 4 g packet Take 1 packet (4 g total) by mouth 2 (two) times daily. 09/21/21 09/21/22 Yes Barbette Merino, NP  diclofenac (VOLTAREN) 50 MG EC tablet Take 50 mg by mouth daily. 10/08/21  Yes [provider]  folic acid (FOLVITE) 1 MG tablet Take 1 tablet (1 mg total) by mouth daily. 09/21/21 09/21/22 Yes King, Crystal M, NP  HORIZANT 600 MG TBCR Take 600-1,200 mg by mouth in the morning and at bedtime. Take 1 tablet (600 mg) in the morning and Take 2 tablets (1200 mg) at bedtime 05/29/21  Yes  [provider]  hydrOXYzine (ATARAX/VISTARIL) 10 MG tablet Take 1 tablet (10 mg total) by mouth 3 (three) times daily as needed. Patient taking differently: Take 10 mg by mouth 3 (three) times daily as needed for anxiety. 09/21/21 09/21/22 Yes Barbette Merino, NP  metoprolol tartrate (LOPRESSOR) 50 MG tablet Take 1 tablet (50 mg total) by mouth 2 (two) times daily. 09/21/21 09/21/22 Yes Barbette Merino, NP  Multiple Vitamin (MULTIVITAMIN WITH MINERALS) TABS tablet Take 1 tablet by mouth daily. Patient taking differently: Take 1 tablet by mouth daily. Centrum 08/11/21  Yes Rai, Ripudeep K, MD  NUCYNTA 100 MG TABS Take 2 tablets (200 mg total) by mouth daily in the afternoon. Patient taking differently: Take 100 mg by mouth daily in the afternoon. Afternoon 08/11/21  Yes Medina-Vargas, Monina C, NP  NUCYNTA ER 250 MG TB12 Take 250 mg by mouth 2 (two) times daily. 12/07/21  Yes [provider]  ondansetron (ZOFRAN ODT) 4 MG disintegrating tablet Take 1 tablet (4 mg total) by mouth every 8 (eight) hours as needed for nausea or vomiting. 09/21/21  Yes Barbette Merino, NP  pantoprazole (PROTONIX) 40 MG tablet Take 1 tablet (40 mg total) by mouth daily. 09/21/21 09/21/22 Yes King, Shana Chute, NP  QUVIVIQ 25 MG TABS Take 25 tablets by mouth at bedtime. 12/08/21  Yes [provider]  thiamine 100 MG tablet Take 1 tablet (100 mg total) by mouth daily for 365 doses. Patient taking differently: Take 250 mg by mouth daily.  09/21/21 09/21/22 Yes King, Crystal M, NP  TRINTELLIX 20 MG TABS tablet Take 20 mg by mouth daily. 06/08/21  Yes [provider]  VRAYLAR 6 MG CAPS Take 6 mg by mouth daily. 07/11/21  Yes [provider]  loperamide (IMODIUM) 2 MG capsule Take 1 capsule (2 mg total) by mouth every 6 (six) hours as needed for diarrhea or loose stools. Patient not taking: Reported on 09/21/2021 08/30/21   Medina-Vargas, Monina C, NP  NUCYNTA ER 200 MG TB12 Take 200 mg by mouth 2  (two) times daily. Patient not taking: Reported on 12/22/2021 08/11/21   Medina-Vargas, Monina C, NP  nystatin powder Apply 1 application topically 2 (two) times daily. Patient not taking: Reported on 09/21/2021 08/30/21   Medina-Vargas, Monina C, NP  vitamin B-12 (CYANOCOBALAMIN) 100 MCG tablet Take 1 tablet (100 mcg total) by mouth daily. Patient not taking: Reported on 12/22/2021 09/21/21 09/21/22  Vevelyn Francois, NP    Scheduled Meds:  amLODipine  10 mg Oral Daily   cariprazine  6 mg Oral Daily   cholestyramine light  4 g Oral BID   doxepin  100 mg Oral QHS   enoxaparin (LOVENOX) injection  60 mg Subcutaneous [id number]   folic acid  1 mg Oral Daily   gabapentin  1,200 mg Oral QHS   gabapentin  600 mg Oral Daily   Gerhardt's butt cream   Topical TID   metoprolol tartrate  50 mg Oral BID   modafinil  200 mg Oral QAC breakfast   multivitamin with minerals  1 tablet Oral Daily   pantoprazole (PROTONIX) IV  40 mg Intravenous Q12H   tapentadol  200 mg Oral Q12H   And   tapentadol  50 mg Oral Q12H   tapentadol  100 mg Oral Q1400   thiamine  100 mg Oral Daily   vortioxetine HBr  20 mg Oral Daily   Continuous Infusions:  lactated ringers 1,000 mL with potassium chloride 40 mEq infusion 150 mL/hr at 12/23/21 0840   promethazine (PHENERGAN) injection (IM or IVPB)     PRN Meds:.acetaminophen **OR** acetaminophen, hydrOXYzine, labetalol, LORazepam, ondansetron (ZOFRAN) IV, promethazine (PHENERGAN) injection (IM or IVPB)  Allergies as of 12/21/2021 - Review Complete 09/21/2021  Allergen Reaction Noted   Buprenorphine Rash 04/10/2018   Amoxicillin-pot clavulanate  02/25/2020   Wound dressing adhesive  06/09/2021   Sulfa antibiotics Rash 04/17/2021    Family History  Problem Relation Age of Onset   Cancer Father     Social History   Socioeconomic History   Marital status: Single    Spouse name: Not on file   Number of children: Not on file   Years of education: Not on file   Highest  education level: Not on file  Occupational History   Not on file  Tobacco Use   Smoking status: Never   Smokeless tobacco: Never  Vaping Use   Vaping Use: Never used  Substance and Sexual Activity   Alcohol use: Yes    Comment: rarely   Drug use: Never   Sexual activity: Not Currently  Other Topics Concern   Not on file  Social History Narrative   Not on file   Social Determinants of Health   Financial Resource Strain: Not on file  Food Insecurity: Not on file  Transportation Needs: Not on file  Physical Activity: Not on file  Stress: Not on file  Social Connections: Not on file  Intimate Partner Violence: Not on file  Review of Systems: All negative except as stated above in HPI.  Physical Exam: Vital signs: Vitals:   12/23/21 0627 12/23/21 1005  BP: (!) 145/96 (!) 157/97  Pulse: 97 99  Resp: 18 20  Temp: 98.2 F (36.8 C) 98 F (36.7 C)  SpO2: 99% 100%   Last BM Date: 12/23/21 General:   Somnolent, obese, no acute distress  Head: normocephalic, atraumatic Eyes: anicteric sclera ENT: oropharynx clear, dry lips Neck: supple, nontender Lungs:  Clear throughout to auscultation.   No wheezes, crackles, or rhonchi. No acute distress. Heart:  Regular rate and rhythm; no murmurs, clicks, rubs,  or gallops. Abdomen: diffuse tenderness with guarding, soft, nondistended, +BS  Rectal:  Deferred Ext: no edema  GI:  Lab Results: Recent Labs    12/21/21 1012 12/23/21 0404  WBC 8.9 8.0  HGB 15.1* 13.4  HCT 44.7 38.9  PLT 211 166   BMET Recent Labs    12/21/21 1012 12/22/21 0235 12/23/21 0404  NA 141 141 142  K 3.1* 3.4* 4.8  CL 108 115* 114*  CO2 22 20* 15*  GLUCOSE 124* 115* 79  BUN 16 9 <5*  CREATININE 0.71 0.79 0.81  CALCIUM 9.5 8.2* 9.3   LFT Recent Labs    12/21/21 1012  PROT 7.5  ALBUMIN 4.4  AST 21  ALT 22  ALKPHOS 85  BILITOT 0.7   PT/INR No results for input(s): LABPROT, INR in the last 72 hours.   Studies/Results: CT  ABDOMEN PELVIS W CONTRAST  Result Date: 12/21/2021 CLINICAL DATA:  Nausea, vomiting, diarrhea, abdominal pain for 2 days EXAM: CT ABDOMEN AND PELVIS WITH CONTRAST TECHNIQUE: Multidetector CT imaging of the abdomen and pelvis was performed using the standard protocol following bolus administration of intravenous contrast. CONTRAST:  55mL OMNIPAQUE IOHEXOL 350 MG/ML SOLN COMPARISON:  07/30/2021, 04/19/2021 FINDINGS: Lower chest: No acute pleural or parenchymal lung disease. Hepatobiliary: No focal liver abnormality is seen. Status post cholecystectomy. No biliary dilatation. Pancreas: Unremarkable. No pancreatic ductal dilatation or surrounding inflammatory changes. Spleen: Normal in size without focal abnormality. Adrenals/Urinary Tract: 9 mm right renal angiomyolipoma unchanged. Indeterminate 7 mm hypodensity left kidney image 29/2. No urinary tract calculi or obstructive uropathy within either kidney. The adrenals are unremarkable. Bladder is grossly normal. Stomach/Bowel: No bowel obstruction or ileus. No bowel wall thickening or inflammatory change. Vascular/Lymphatic: Aortic atherosclerosis. No enlarged abdominal or pelvic lymph nodes. Reproductive: Uterus and bilateral adnexa are unremarkable. Other: No free fluid or free gas.  No abdominal wall hernia. Musculoskeletal: No acute or destructive bony lesions. 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 Severe spondylosis at the L5-S1 level unchanged. Reconstructed images demonstrate no additional findings. IMPRESSION: 1. No acute intra-abdominal or intrapelvic process. 2. Indeterminate 7 mm hypodensity left kidney, unchanged since 04/19/2021. If further evaluation is desired, dedicated nonemergent renal MRI could be considered. 3. Stable small right renal angiomyolipoma. 4.  Aortic Atherosclerosis (ICD10-I70.0). Electronically Signed   By: Randa Ngo M.D.   On: 12/21/2021 17:12    Impression/Plan: Intractable  nausea/vomiting without any documented emesis in chart. Need strict I/O's documented to clarify is she truly having vomiting. I would continue Zofran prn and supportive care. Doubt infection but GI pathogen panel pending. Continue Cholestyramine unless infection on stool studies. I do not think an endoscopy is warranted at this time for her N/V. Slowly advance diet starting tomorrow. Will f/u.    LOS: 1 day   [name]  12/23/2021, 12:47 PM  Questions please call [phone]

## 2021-12-23 NOTE — Progress Notes (Signed)
PROGRESS NOTE    Holly Roberts  ION:[id number] DOB: 02-15-1966 DOA: 12/21/2021 PCP: Barbette Merino, NP     Brief Narrative:  57 year old female with medical history significant for morbid obesity, bipolar disorder, anxiety, depression, primary narcolepsy with catalepsy, chronic pain syndrome, hypertension, acute cholecystitis s/p laparoscopic cholecystectomy in May 2022 and postop course complicated by recurrent nausea, vomiting, diarrhea secondary to bile acid malabsorption syndrome presented to the ED via EMS for nausea, vomiting, diarrhea and abdominal pain.  CT abdomen and pelvis negative for acute findings.  Admitted for suspected bile acid malabsorption syndrome with intractable nausea, vomiting and diarrhea.  Supportive care.   New events last 24 hours / Subjective: Tried drinking a PO electrolyte solution that caused her to vomit. Zofran not helpful. She feels her pain is worsening. Diarrhea uncontrolled, has not been able to tolerate PO Questran.   Assessment & Plan:   Principal Problem:   Intractable nausea and vomiting  Consult GI today  IVF's LR 150 mL/hr  Hold PO meds while N/V persists  Zofran/Phenergan prn  Monitor electrolytes  Clear liquid diet advancing as tolerated  Protonix 40 mg twice daily  Active Problems: Essential hypertension  Continue Norvasc 10 mg daily   Metoprolol tartrate 50 mg twice daily   Chronic pain  Continue Nucynta  Gabapentin 600 mg in the morning and 1200 mg in the evening  Bipolar disorder  Cariprazine 6 mg daily  Trintellix 20 mg daily  Ativan 0.5 mg every 8 hours as needed  Doxepin 100 mg nightly  Modafinil 200 mg daily    Diarrhea  Resume Questran when nausea resolves    Hypokalemia  Monitor    Elevated troponin  No chest pain or shortness of breath, likely demand ischemia, EKG unremarkable   DVT prophylaxis: Lovenox Code Status: Full Family Communication: Self Coming From: Home Disposition Plan: Home Barriers to  Discharge: Medical improvement  Consultants:  Consult GI today  Procedures:  None   Objective: Vitals:   12/22/21 2227 12/23/21 0159 12/23/21 0627 12/23/21 1005  BP: (!) 144/87 (!) 145/93 (!) 145/96 (!) 157/97  Pulse: 100 100 97 99  Resp: 18 20 18 20   Temp: 98.2 F (36.8 C) 99.6 F (37.6 C) 98.2 F (36.8 C) 98 F (36.7 C)  TempSrc: Oral Oral Oral Oral  SpO2: 98% 100% 99% 100%  Weight:  121.3 kg    Height:  5\' 4"  (1.626 m)      Intake/Output Summary (Last 24 hours) at 12/23/2021 1211 Last data filed at 12/23/2021 1041 Gross per 24 hour  Intake 2338.33 ml  Output 400 ml  Net 1938.33 ml   Filed Weights   12/21/21 0916 12/23/21 0159  Weight: 120.2 kg 121.3 kg    Examination:  General exam: Appears calm and comfortable  Respiratory system: Clear to auscultation. Respiratory effort normal. No respiratory distress. No conversational dyspnea.  Cardiovascular system: S1 & S2 heard, RRR. No murmurs. No pedal edema. Gastrointestinal system: Abdomen is nondistended, soft and diffusely TTP. Normal bowel sounds heard. Central nervous system: Alert and oriented. No focal neurological deficits. Speech clear.  Extremities: Symmetric in appearance  Skin: No rashes, lesions or ulcers on exposed skin  Psychiatry: Judgement and insight appear normal. Mood & affect appropriate.   Data Reviewed: I have personally reviewed following labs and imaging studies  CBC: Recent Labs  Lab 12/21/21 1012 12/23/21 0404  WBC 8.9 8.0  HGB 15.1* 13.4  HCT 44.7 38.9  MCV 87.8 86.8  PLT 211 166  Basic Metabolic Panel: Recent Labs  Lab 12/21/21 1012 12/21/21 2133 12/22/21 0235 12/23/21 0404  NA 141  --  141 142  K 3.1*  --  3.4* 4.8  CL 108  --  115* 114*  CO2 22  --  20* 15*  GLUCOSE 124*  --  115* 79  BUN 16  --  9 <5*  CREATININE 0.71  --  0.79 0.81  CALCIUM 9.5  --  8.2* 9.3  MG  --  1.7  --   --    GFR: Estimated Creatinine Clearance: 100.7 mL/min (by C-G formula based on SCr  of 0.81 mg/dL).  Liver Function Tests: Recent Labs  Lab 12/21/21 1012  AST 21  ALT 22  ALKPHOS 85  BILITOT 0.7  PROT 7.5  ALBUMIN 4.4   Recent Labs  Lab 12/21/21 1012  LIPASE 42   Recent Results (from the past 240 hour(s))  C Difficile Quick Screen w PCR reflex     Status: None   Collection Time: 12/21/21  8:21 AM   Specimen: STOOL  Result Value Ref Range Status   C Diff antigen NEGATIVE NEGATIVE Final   C Diff toxin NEGATIVE NEGATIVE Final   C Diff interpretation No C. difficile detected.  Final    Comment: Performed at [facility], [address], [place], [place] [address]  Resp Panel by RT-PCR (Flu A&B, Covid) Nasopharyngeal Swab     Status: None   Collection Time: 12/21/21  6:06 PM   Specimen: Nasopharyngeal Swab; Nasopharyngeal(NP) swabs in vial transport medium  Result Value Ref Range Status   SARS Coronavirus 2 by RT PCR NEGATIVE NEGATIVE Final    Comment: (NOTE) SARS-CoV-2 target nucleic acids are NOT DETECTED.  The SARS-CoV-2 RNA is generally detectable in upper respiratory specimens during the acute phase of infection. The lowest concentration of SARS-CoV-2 viral copies this assay can detect is 138 copies/mL. A negative result does not preclude SARS-Cov-2 infection and should not be used as the sole basis for treatment or other patient management decisions. A negative result may occur with  improper specimen collection/handling, submission of specimen other than nasopharyngeal swab, presence of viral mutation(s) within the areas targeted by this assay, and inadequate number of viral copies(<138 copies/mL). A negative result must be combined with clinical observations, patient history, and epidemiological information. The expected result is Negative.  Fact Sheet for Patients:  BloggerCourse.com  Fact Sheet for Healthcare Providers:  SeriousBroker.it  This test is no t yet approved or  cleared by the [place] FDA and  has been authorized for detection and/or diagnosis of SARS-CoV-2 by FDA under an Emergency Use Authorization (EUA). This EUA will remain  in effect (meaning this test can be used) for the duration of the COVID-19 declaration under Section 564(b)(1) of the Act, 21 U.S.C.section 360bbb-3(b)(1), unless the authorization is terminated  or revoked sooner.       Influenza A by PCR NEGATIVE NEGATIVE Final   Influenza B by PCR NEGATIVE NEGATIVE Final    Comment: (NOTE) The Xpert Xpress SARS-CoV-2/FLU/RSV plus assay is intended as an aid in the diagnosis of influenza from Nasopharyngeal swab specimens and should not be used as a sole basis for treatment. Nasal washings and aspirates are unacceptable for Xpert Xpress SARS-CoV-2/FLU/RSV testing.  Fact Sheet for Patients: BloggerCourse.com  Fact Sheet for Healthcare Providers: SeriousBroker.it  This test is not yet approved or cleared by the [place] FDA and has been authorized for detection and/or diagnosis of SARS-CoV-2  by FDA under an Emergency Use Authorization (EUA). This EUA will remain in effect (meaning this test can be used) for the duration of the COVID-19 declaration under Section 564(b)(1) of the Act, 21 U.S.C. section 360bbb-3(b)(1), unless the authorization is terminated or revoked.  Performed at [facility], [address], [place], [place] [address]       Radiology Studies: CT ABDOMEN PELVIS W CONTRAST  Result Date: 12/21/2021 CLINICAL DATA:  Nausea, vomiting, diarrhea, abdominal pain for 2 days EXAM: CT ABDOMEN AND PELVIS WITH CONTRAST TECHNIQUE: Multidetector CT imaging of the abdomen and pelvis was performed using the standard protocol following bolus administration of intravenous contrast. CONTRAST:  80mL OMNIPAQUE IOHEXOL 350 MG/ML SOLN COMPARISON:  07/30/2021, 04/19/2021 FINDINGS: Lower chest: No acute  pleural or parenchymal lung disease. Hepatobiliary: No focal liver abnormality is seen. Status post cholecystectomy. No biliary dilatation. Pancreas: Unremarkable. No pancreatic ductal dilatation or surrounding inflammatory changes. Spleen: Normal in size without focal abnormality. Adrenals/Urinary Tract: 9 mm right renal angiomyolipoma unchanged. Indeterminate 7 mm hypodensity left kidney image 29/2. No urinary tract calculi or obstructive uropathy within either kidney. The adrenals are unremarkable. Bladder is grossly normal. Stomach/Bowel: No bowel obstruction or ileus. No bowel wall thickening or inflammatory change. Vascular/Lymphatic: Aortic atherosclerosis. No enlarged abdominal or pelvic lymph nodes. Reproductive: Uterus and bilateral adnexa are unremarkable. Other: No free fluid or free gas.  No abdominal wall hernia. Musculoskeletal: No acute or destructive bony lesions. 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 Severe spondylosis at the L5-S1 level unchanged. Reconstructed images demonstrate no additional findings. IMPRESSION: 1. No acute intra-abdominal or intrapelvic process. 2. Indeterminate 7 mm hypodensity left kidney, unchanged since 04/19/2021. If further evaluation is desired, dedicated nonemergent renal MRI could be considered. 3. Stable small right renal angiomyolipoma. 4.  Aortic Atherosclerosis (ICD10-I70.0). Electronically Signed   By: Sharlet Salina M.D.   On: 12/21/2021 17:12     Scheduled Meds:  amLODipine  10 mg Oral Daily   cariprazine  6 mg Oral Daily   cholestyramine light  4 g Oral BID   doxepin  100 mg Oral QHS   enoxaparin (LOVENOX) injection  60 mg Subcutaneous Q24H   folic acid  1 mg Oral Daily   gabapentin  1,200 mg Oral QHS   gabapentin  600 mg Oral Daily   Gerhardt's butt cream   Topical TID   metoprolol tartrate  50 mg Oral BID   modafinil  200 mg Oral QAC breakfast   multivitamin with minerals  1 tablet Oral Daily    pantoprazole (PROTONIX) IV  40 mg Intravenous Q12H   tapentadol  200 mg Oral Q12H   And   tapentadol  50 mg Oral Q12H   tapentadol  100 mg Oral Q1400   thiamine  100 mg Oral Daily   vortioxetine HBr  20 mg Oral Daily   Continuous Infusions:  lactated ringers 1,000 mL with potassium chloride 40 mEq infusion 150 mL/hr at 12/23/21 0840   promethazine (PHENERGAN) injection (IM or IVPB)       LOS: 1 day    Time spent: 35 minutes   Sharlene Dory, DO [facility] 12/23/2021, 12:11 PM   Available via Epic secure chat 7am-7pm After these hours, please refer to coverage provider listed on [email or web address]

## 2021-12-23 NOTE — Progress Notes (Signed)
Patient ID: Holly Roberts, female   DOB: [birth year], 56 y.o.   MRN: [id number] Patient reports anxiety, IV Ativan helped yesterday, will start on as needed nightly Xanax. Huey Bienenstock MD

## 2021-12-23 NOTE — Progress Notes (Signed)
Patient complains of nausea with small amount of liquid PO intake.  Able to take pills after Zofran administered.  No vomiting occurrences today.  Patient had 2 Type 7 liquid stools in the morning.  No clear liquid meal intake.  Bradd Burner, RN

## 2021-12-24 DIAGNOSIS — A0811 Acute gastroenteropathy due to Norwalk agent: Secondary | ICD-10-CM | POA: Diagnosis present

## 2021-12-24 LAB — CBC
HCT: 35.1 % — ABNORMAL LOW (ref 36.0–46.0)
Hemoglobin: 11.2 g/dL — ABNORMAL LOW (ref 12.0–15.0)
MCH: 29.4 pg (ref 26.0–34.0)
MCHC: 31.9 g/dL (ref 30.0–36.0)
MCV: 92.1 fL (ref 80.0–100.0)
Platelets: 178 10*3/uL (ref 150–400)
RBC: 3.81 MIL/uL — ABNORMAL LOW (ref 3.87–5.11)
RDW: 12.1 % (ref 11.5–15.5)
WBC: 4.7 10*3/uL (ref 4.0–10.5)
nRBC: 0 % (ref 0.0–0.2)

## 2021-12-24 LAB — BASIC METABOLIC PANEL
Anion gap: 5 (ref 5–15)
BUN: <5 mg/dL — ABNORMAL LOW (ref 6–20)
CO2: 22 mmol/L (ref 22–32)
Calcium: 8.5 mg/dL — ABNORMAL LOW (ref 8.9–10.3)
Chloride: 112 mmol/L — ABNORMAL HIGH (ref 98–111)
Creatinine, Ser: 0.72 mg/dL (ref 0.44–1.00)
GFR, Estimated: >60 mL/min (ref >60)
Glucose, Bld: 73 mg/dL (ref 70–99)
Potassium: 4.5 mmol/L (ref 3.5–5.1)
Sodium: 139 mmol/L (ref 135–145)

## 2021-12-24 MED ORDER — ONDANSETRON HCL 4 MG PO TABS
4.0000 mg | ORAL_TABLET | Freq: Three times a day (TID) | ORAL | Status: DC | PRN
  Administered 2021-12-25: 4 mg via ORAL
  Filled 2021-12-24: qty 1

## 2021-12-24 MED ORDER — PANTOPRAZOLE SODIUM 40 MG PO TBEC
40.0000 mg | DELAYED_RELEASE_TABLET | Freq: Two times a day (BID) | ORAL | Status: DC
  Administered 2021-12-25: 40 mg via ORAL
  Filled 2021-12-24: qty 1

## 2021-12-24 MED ORDER — MELATONIN 5 MG PO TABS
5.0000 mg | ORAL_TABLET | Freq: Once | ORAL | Status: AC
  Administered 2021-12-24: 5 mg via ORAL
  Filled 2021-12-24: qty 1

## 2021-12-24 MED ORDER — LORAZEPAM 2 MG/ML IJ SOLN: 0.5000 mg | Freq: Three times a day (TID) | INTRAMUSCULAR | Status: DC | PRN

## 2021-12-24 MED ORDER — LORAZEPAM 0.5 MG PO TABS
0.5000 mg | ORAL_TABLET | Freq: Three times a day (TID) | ORAL | Status: DC | PRN
  Administered 2021-12-24: 0.5 mg via ORAL
  Filled 2021-12-24: qty 1

## 2021-12-24 MED ORDER — LACTATED RINGERS IV SOLN: INTRAVENOUS | Status: AC

## 2021-12-24 MED ORDER — SODIUM ACETATE 2 MEQ/ML IV SOLN: INTRAVENOUS | Status: DC

## 2021-12-24 NOTE — Progress Notes (Signed)
Patient's diet advanced to soft.  Ate chicken breast, half mashed potatoes, some peas.  Tolerated well.  Bradd Burner, RN

## 2021-12-24 NOTE — Evaluation (Signed)
Occupational Therapy Evaluation Patient Details Name: Holly Roberts MRN: [id number] DOB: [birth year] Today's Date: 12/24/2021   History of Present Illness Holly Roberts is a 56 y.o. 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 Patient has been having 2 days of intractable nausea/vomiting and diarrhea. PMH: Bipolar, anxietym HTN, GERD, ARF   Clinical Impression   Patient is a 56 year old female who was noted to have had a slight decline in the ability to participate in ADLs. Patient was noted to have had new onset of R shoulder dysfunction with no clear etiology since last admission with patient unable to flex/abduct shoulder past 15 degrees actively. Patient reported it happening months prior with not having checked it out with MD at the time.patient was educated on importance of checking out little health problems before they became big health problems and benefits. Patient verbalized understanding.  Patient was noted to have had a decline in LB dressing tasks and activity tolerance and would benefit from skilled OT services to prevent further decline. Patient would continue to benefit from skilled OT services at this time while admitted to address noted deficits in order to improve overall safety and independence in ADLs.       Recommendations for follow up therapy are one component of a multi-disciplinary discharge planning process, led by the attending physician.  Recommendations may be updated based on patient status, additional functional criteria and insurance authorization.   Follow Up Recommendations  No OT follow up    Assistance Recommended at Discharge Frequent or constant Supervision/Assistance  Patient can return home with the following A little help with bathing/dressing/bathroom;Direct supervision/assist for financial management;Assistance with cooking/housework    Functional Status Assessment  Patient has had a recent decline in their functional  status and demonstrates the ability to make significant improvements in function in a reasonable and predictable amount of time.  Equipment Recommendations  None recommended by OT    Recommendations for Other Services       Precautions / Restrictions Precautions Precautions: Fall Restrictions Weight Bearing Restrictions: No      Mobility Bed Mobility               General bed mobility comments: up in recliner on this date    Transfers                          Balance Overall balance assessment: Mild deficits observed, not formally tested                                         ADL either performed or assessed with clinical judgement   ADL Overall ADL's : Needs assistance/impaired Eating/Feeding: Modified independent;Sitting   Grooming: Wash/dry hands;Sitting;Set up;Wash/dry face Grooming Details (indicate cue type and reason): patient was min A to brush hair with personal brush with patient able to use BUE to get about 75% on hair and needing remainder for center back of head. Upper Body Bathing: Moderate assistance;Sitting Upper Body Bathing Details (indicate cue type and reason): patient has daughter assitance for bathing tasks at this time Lower Body Bathing: Maximal assistance Lower Body Bathing Details (indicate cue type and reason): patient had daughter assistance for this tasks prior level Upper Body Dressing : Moderate assistance;Sitting Upper Body Dressing Details (indicate cue type and reason): patient has daughter support at home with recent shoudler  issue. Lower Body Dressing: Maximal assistance;Sit to/from stand;Sitting/lateral leans Lower Body Dressing Details (indicate cue type and reason): patient was educated on using AE during last admission. patient reported daughter assists at home. Toilet Transfer: Min guard;Ambulation;Regular Teacher, adult education Details (indicate cue type and reason): with IV pole Toileting-  Clothing Manipulation and Hygiene: Supervision/safety;Sit to/from stand       Functional mobility during ADLs: Min guard General ADL Comments: with increased time. HR maintained stable on this date.     Vision Baseline Vision/History: 1 Wears glasses Ability to See in Adequate Light: 2 Moderately impaired Patient Visual Report: No change from baseline       Perception     Praxis      Pertinent Vitals/Pain Pain Assessment: Faces Faces Pain Scale: Hurts little more Pain Location: R shoulder with attempts at movement Pain Descriptors / Indicators: Grimacing;Sharp Pain Intervention(s): Limited activity within patient's tolerance;Monitored during session;Repositioned     Hand Dominance Right   Extremity/Trunk Assessment Upper Extremity Assessment Upper Extremity Assessment: RUE deficits/detail;LUE deficits/detail RUE Deficits / Details: patient noted to have significate change in ROM from previous admission on this UE. this is dominant side with patient unable to particiapte in more than 15 degrees of felxion or extension. elbow hand and wrist are WNL. patient unable to tolerate PROM past 50 degrees on this UE. patient reported getting tattoo on leg lying on table and having decreased movement in arm after. patient reported not seeing MD after. previous evaluation noted patient was able to abduct to about 80 degrees bilaterally which is not case on this date. LUE Deficits / Details: patient was noted to have elbow hand and wrist with functional limits. patient noted to have about  30 degrees flexion and abduction of shoulder with patient noted to have had shoulder deficits on this side prior admission.   Lower Extremity Assessment Lower Extremity Assessment: Defer to PT evaluation   Cervical / Trunk Assessment Cervical / Trunk Assessment: Normal   Communication Communication Communication: No difficulties   Cognition Arousal/Alertness: Awake/alert Behavior During Therapy: WFL  for tasks assessed/performed Overall Cognitive Status: Within Functional Limits for tasks assessed                                       General Comments       Exercises Other Exercises Other Exercises: discussion with patient about home work set up with patient reporting increased shoulder pain with desk set up. patient was provided with proper body mechanics and environmental modifications to ensure mechanics are acheived.   Shoulder Instructions      Home Living Family/patient expects to be discharged to:: Private residence Living Arrangements: Children Available Help at Discharge: Family;Available 24 hours/day Type of Home: Apartment Home Access: Stairs to enter [employer] of Steps: 3 steps to enter Entrance Stairs-Rails: None Home Layout: One level     Bathroom Shower/Tub: Chief Strategy Officer: Standard Bathroom Accessibility: Yes   Home Equipment: Rollator (4 wheels);Shower seat;Toilet riser   Additional Comments: lives with daughter      Prior Functioning/Environment               Mobility Comments: in apartment amb without AD, longer distances uses rollator or goes to stores with electric scooters ADLs Comments: daughter bathes her and assists her due to weak shoulders.patient daughter helps with cooking, cleaning and driving and LB dressing.patient indicated not being  able to stand long.        OT Problem List: Impaired UE functional use;Decreased knowledge of use of DME or AE      OT Treatment/Interventions: Self-care/ADL training;Energy conservation;DME and/or AE instruction;Therapeutic activities;Patient/family education    OT Goals(Current goals can be found in the care plan section) Acute Rehab OT Goals Patient Stated Goal: to use shoulders again OT Goal Formulation: With patient Time For Goal Achievement: 01/07/22 Potential to Achieve Goals: Good  OT Frequency: Min 1X/week    Co-evaluation               AM-PAC OT "6 Clicks" Daily Activity     Outcome Measure Help from another person eating meals?: A Little Help from another person taking care of personal grooming?: A Little Help from another person toileting, which includes using toliet, bedpan, or urinal?: A Little Help from another person bathing (including washing, rinsing, drying)?: A Lot Help from another person to put on and taking off regular upper body clothing?: A Little Help from another person to put on and taking off regular lower body clothing?: A Lot 6 Click Score: 16   End of Session Equipment Utilized During Treatment: Gait belt Nurse Communication: Mobility status  Activity Tolerance: Patient tolerated treatment well Patient left: in chair;with call bell/phone within reach  OT Visit Diagnosis: Unsteadiness on feet (R26.81);Pain                Time: 1359-1441 OT Time Calculation (min): 42 min Charges:  OT General Charges $OT Visit: 1 Visit OT Evaluation $OT Eval Low Complexity: 1 Low OT Treatments $Self Care/Home Management : 23-37 mins  Sharyn Blitz OTR/L, MS Acute Rehabilitation Department Office# [phone] Pager# [phone]   Ardyth Harps 12/24/2021, 4:23 PM

## 2021-12-24 NOTE — Progress Notes (Signed)
Patient lost PIV access.  MD approved patient not having IV access due to patient tolerating PO intake.  Bradd Burner, RN

## 2021-12-24 NOTE — Progress Notes (Signed)
[facility] Gastroenterology Progress Note  Holly Roberts 55 y.o. [birth year]   Subjective: No vomiting or diarrhea today. Tolerating liquid diet. Denies abdominal pain. Daughter in room. Patient in bedside chair.  Objective: Vital signs: Vitals:   12/23/21 2043 12/24/21 0506  BP: 131/75 124/73  Pulse: 79 64  Resp: 18 18  Temp: 98.3 F (36.8 C) 97.7 F (36.5 C)  SpO2: 97% 96%    Physical Exam: Gen: alert, no acute distress, obese HEENT: anicteric sclera CV: RRR Chest: CTA B Abd: soft, nontender, nondistended, +BS Ext: no edema  Lab Results: Recent Labs    12/21/21 2133 12/22/21 0235 12/23/21 0404 12/24/21 0334  NA  --    < > 142 139  K  --    < > 4.8 4.5  CL  --    < > 114* 112*  CO2  --    < > 15* 22  GLUCOSE  --    < > 79 73  BUN  --    < > <5* <5*  CREATININE  --    < > 0.81 0.72  CALCIUM  --    < > 9.3 8.5*  MG 1.7  --   --   --    < > = values in this interval not displayed.   No results for input(s): AST, ALT, ALKPHOS, BILITOT, PROT, ALBUMIN in the last 72 hours. Recent Labs    12/23/21 0404 12/24/21 0334  WBC 8.0 4.7  HGB 13.4 11.2*  HCT 38.9 35.1*  MCV 86.8 92.1  PLT 166 178      Assessment/Plan: Norovirus on stool studies. N/V/D improving with supportive care. On home dose of Cholestyramine. Changed to soft diet. Will sign off. Call us back if questions.    Holly Roberts 12/24/2021, 12:39 PM  Questions please call [phone] Patient ID: Holly Roberts, female   DOB: December 11, 1966, 56 y.o.   MRN: [id number]

## 2021-12-24 NOTE — Progress Notes (Signed)
PT Cancellation Note  Patient Details Name: Holly Roberts MRN: [id number] DOB: Aug 19, 1966   Cancelled Treatment:    Reason Eval/Treat Not Completed: Patient at procedure or test/unavailable. Pt with nurse and getting meds. Will check back later today to complete evaluation.   Enzo Montgomery 12/24/2021, 10:46 AM

## 2021-12-24 NOTE — Evaluation (Signed)
Physical Therapy Evaluation Patient Details Name: Holly Roberts MRN: [id number] DOB: 10/02/1966 Today's Date: 12/24/2021  History of Present Illness  Holly Roberts is a 56 y.o. 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 Patient has been having 2 days of intractable nausea/vomiting and diarrhea. PMH: Bipolar, anxietym HTN, GERD, ARF  Clinical Impression  Pt admitted with above diagnosis.  Pt currently with functional limitations due to the deficits listed below (see PT Problem List). Pt will benefit from skilled PT to increase their independence and safety with mobility to allow discharge to the venue listed below.  Pt limited in gait, but at min to min/guard level with RW.  Recommend home with daughter and HHPT.  She would also benefit from OT to assess ADLs as daughter does most of them for her and feel pt could be more independent.         Recommendations for follow up therapy are one component of a multi-disciplinary discharge planning process, led by the attending physician.  Recommendations may be updated based on patient status, additional functional criteria and insurance authorization.  Follow Up Recommendations Home health PT    Assistance Recommended at Discharge    Patient can return home with the following  A little help with walking and/or transfers    Equipment Recommendations None recommended by PT  Recommendations for Other Services  OT consult    Functional Status Assessment Patient has had a recent decline in their functional status and demonstrates the ability to make significant improvements in function in a reasonable and predictable amount of time.     Precautions / Restrictions Precautions Precautions: Fall Restrictions Weight Bearing Restrictions: No      Mobility  Bed Mobility Overal bed mobility: Modified Independent             General bed mobility comments: HOB elevated    Transfers Overall transfer level: Needs  assistance Equipment used: 1 person hand held assist Transfers: Sit to/from Stand Sit to Stand: Min guard;Min assist           General transfer comment: MIN A from bed and min/guard from elevated toilet    Ambulation/Gait Ambulation/Gait assistance: Min guard;Min assist Gait Distance (Feet): 15 Feet (plus 30') Assistive device: IV Pole Gait Pattern/deviations: Decreased step length - right;Decreased step length - left;Step-through pattern       General Gait Details: decreased step length initially, which improved as gait progressed. Pt declined ambulation outside of room.  Stairs            Wheelchair Mobility    Modified Rankin (Stroke Patients Only)       Balance Overall balance assessment: Mild deficits observed, not formally tested                                           Pertinent Vitals/Pain Pain Assessment: Faces Pain Location: daughter states mom has chronic pain. pt states she has a rash in perineum after having the diarrhea.    Home Living Family/patient expects to be discharged to:: Private residence Living Arrangements: Children Available Help at Discharge: Family;Available 24 hours/day Type of Home: Apartment Home Access: Stairs to enter Entrance Stairs-Rails: None Entrance Stairs-Number of Steps: 3 steps to enter   Home Layout: One level Home Equipment: Rollator (4 wheels);Shower seat;Toilet riser Additional Comments: lives with daughter    Prior Function  Mobility Comments: in apartment amb without AD, longer distances uses rollator or goes to stores with electric scooters ADLs Comments: daughter bathes her and assists her due to weak shoulders     Hand Dominance        Extremity/Trunk Assessment   Upper Extremity Assessment Upper Extremity Assessment: Defer to OT evaluation (decreased shoulder ROM)    Lower Extremity Assessment Lower Extremity Assessment: Generalized weakness    Cervical  / Trunk Assessment Cervical / Trunk Assessment: Normal  Communication      Cognition Arousal/Alertness: Awake/alert Behavior During Therapy: WFL for tasks assessed/performed Overall Cognitive Status: Within Functional Limits for tasks assessed                                 General Comments: daughter is quick to answer questions for mom, although mom is capable        General Comments      Exercises     Assessment/Plan    PT Assessment Patient needs continued PT services  PT Problem List Decreased activity tolerance;Decreased mobility       PT Treatment Interventions Gait training;Functional mobility training;Balance training;Therapeutic exercise;Therapeutic activities    PT Goals (Current goals can be found in the Care Plan section)  Acute Rehab PT Goals Patient Stated Goal: get stronger PT Goal Formulation: With patient/family Time For Goal Achievement: 01/07/22 Potential to Achieve Goals: Good    Frequency Min 3X/week     Co-evaluation               AM-PAC PT "6 Clicks" Mobility  Outcome Measure Help needed turning from your back to your side while in a flat bed without using bedrails?: None Help needed moving from lying on your back to sitting on the side of a flat bed without using bedrails?: A Little Help needed moving to and from a bed to a chair (including a wheelchair)?: A Little Help needed standing up from a chair using your arms (e.g., wheelchair or bedside chair)?: A Little Help needed to walk in hospital room?: A Little Help needed climbing 3-5 steps with a railing? : A Little 6 Click Score: 19    End of Session Equipment Utilized During Treatment: Gait belt Activity Tolerance: Patient tolerated treatment well Patient left: in chair;with call bell/phone within reach;with family/visitor present Nurse Communication: Mobility status PT Visit Diagnosis: Muscle weakness (generalized) (M62.81);Difficulty in walking, not elsewhere  classified (R26.2)    Time: 1610-9604 PT Time Calculation (min) (ACUTE ONLY): 30 min   Charges:   PT Evaluation $PT Eval Moderate Complexity: 1 Mod PT Treatments $Gait Training: 8-22 mins        Bonnetta Allbee L. Katrinka Blazing, PT  12/24/2021   Enzo Montgomery 12/24/2021, 12:56 PM

## 2021-12-24 NOTE — Progress Notes (Addendum)
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  CT abdomen and pelvis negative for acute findings.  Admitted for suspected bile acid malabsorption syndrome with intractable nausea, vomiting and diarrhea. Gi was consulted, no further intervention deemed necessary, cx + for norovirus.    New events last 24 hours / Subjective: Pt tolerating CLD. Feeling a little better. Stool cx + for Norovirus. She has meals delivered to her, but unsure how exactly she got. Pain, N/V improved.   Assessment & Plan:   Principal Problem:   Intractable nausea and vomiting 2/2 Norovirus             Appreciate GI             IVF's LR 100 mL/hr, reducing from 150 mL/hr due to better PO intake             Hold PO meds while N/V persists             Zofran/Phenergan prn             Monitor electrolytes             Full liquid diet advancing as tolerated             Protonix 40 mg twice daily  If she continues to improve, will hopefully get home 1/9 or 1/10, will ask PT to see her   Active Problems: Essential hypertension             Continue Norvasc 10 mg daily             Metoprolol tartrate 50 mg twice daily   Chronic pain             Continue Nucynta             Gabapentin 600 mg in the morning and 1200 mg in the evening   Bipolar disorder             Cariprazine 6 mg daily             Trintellix 20 mg daily             Ativan 0.5 mg every 8 hours as needed             Doxepin 100 mg  nightly             Modafinil 200 mg daily     Hypokalemia             Monitor, currently WNL     Elevated troponin             No chest pain or shortness of breath, likely demand ischemia, EKG unremarkable     DVT prophylaxis: Lovenox Code Status: Full Family Communication: Self Coming From: Home Disposition Plan: Home Barriers to Discharge: Medical improvement   Consultants:  GI    Procedures:  None  Objective: Vitals:   12/23/21 1005 12/23/21 1441 12/23/21 2043 12/24/21 0506  BP: (!) 157/97 113/74 131/75 124/73  Pulse: 99 67 79 64  Resp: 20 14 18  18  Temp: 98 F (36.7 C) 98 F (36.7 C) 98.3 F (36.8 C) 97.7 F (36.5 C)  TempSrc: Oral Oral Oral   SpO2: 100% 97% 97% 96%  Weight:      Height:        Intake/Output Summary (Last 24 hours) at 12/24/2021 1231 Last data filed at 12/24/2021 2536 Gross per 24 hour  Intake 3416.65 ml  Output 1200 ml  Net 2216.65 ml   Filed Weights   12/21/21 0916 12/23/21 0159  Weight: 120.2 kg 121.3 kg    Examination:  General exam: Appears calm and comfortable  Respiratory system: Clear to auscultation. Respiratory effort normal. No respiratory distress. No conversational dyspnea.  Cardiovascular system: S1 & S2 heard, RRR. No murmurs. No pedal edema. Gastrointestinal system: Abdomen is nondistended, soft and diffusely ttp. Normal bowel sounds heard. Central nervous system: Alert and oriented. No focal neurological deficits. Speech clear.  Extremities: Symmetric in appearance  Skin: No rashes, lesions or ulcers on exposed skin  Psychiatry: Judgement and insight appear normal. Mood & affect appropriate.   Data Reviewed: I have personally reviewed following labs and imaging studies  CBC: Recent Labs  Lab 12/21/21 1012 12/23/21 0404 12/24/21 0334  WBC 8.9 8.0 4.7  HGB 15.1* 13.4 11.2*  HCT 44.7 38.9 35.1*  MCV 87.8 86.8 92.1  PLT 211 166 178   Basic Metabolic Panel: Recent Labs  Lab 12/21/21 1012 12/21/21 2133  12/22/21 0235 12/23/21 0404 12/24/21 0334  NA 141  --  141 142 139  K 3.1*  --  3.4* 4.8 4.5  CL 108  --  115* 114* 112*  CO2 22  --  20* 15* 22  GLUCOSE 124*  --  115* 79 73  BUN 16  --  9 <5* <5*  CREATININE 0.71  --  0.79 0.81 0.72  CALCIUM 9.5  --  8.2* 9.3 8.5*  MG  --  1.7  --   --   --    GFR: Estimated Creatinine Clearance: 102 mL/min (by C-G formula based on SCr of 0.72 mg/dL).  Liver Function Tests: Recent Labs  Lab 12/21/21 1012  AST 21  ALT 22  ALKPHOS 85  BILITOT 0.7  PROT 7.5  ALBUMIN 4.4   Recent Labs  Lab 12/21/21 1012  LIPASE 42   Recent Results (from the past 240 hour(s))  C Difficile Quick Screen w PCR reflex     Status: None   Collection Time: 12/21/21  8:21 AM   Specimen: STOOL  Result Value Ref Range Status   C Diff antigen NEGATIVE NEGATIVE Final   C Diff toxin NEGATIVE NEGATIVE Final   C Diff interpretation No C. difficile detected.  Final    Comment: Performed at [facility], [address] [place], [place] [address]  Gastrointestinal Panel by PCR , Stool     Status: Abnormal   Collection Time: 12/21/21  8:21 AM   Specimen: STOOL  Result Value Ref Range Status   Campylobacter species NOT DETECTED NOT DETECTED Final   Plesimonas shigelloides NOT DETECTED NOT DETECTED Final   Salmonella species NOT DETECTED NOT DETECTED Final   Yersinia enterocolitica NOT DETECTED NOT DETECTED Final   Vibrio species NOT DETECTED NOT DETECTED Final   Vibrio cholerae NOT DETECTED NOT DETECTED Final   Enteroaggregative E coli (EAEC) NOT DETECTED NOT DETECTED Final   Enteropathogenic E coli (EPEC) NOT DETECTED NOT DETECTED Final   Enterotoxigenic E coli (ETEC) NOT DETECTED NOT DETECTED Final   Shiga like  toxin producing E coli (STEC) NOT DETECTED NOT DETECTED Final   Shigella/Enteroinvasive E coli (EIEC) NOT DETECTED NOT DETECTED Final   Cryptosporidium NOT DETECTED NOT DETECTED Final   Cyclospora cayetanensis NOT DETECTED NOT DETECTED  Final   Entamoeba histolytica NOT DETECTED NOT DETECTED Final   Giardia lamblia NOT DETECTED NOT DETECTED Final   Adenovirus F40/41 NOT DETECTED NOT DETECTED Final   Astrovirus NOT DETECTED NOT DETECTED Final   Norovirus GI/GII DETECTED (A) NOT DETECTED Final    Comment: RESULT CALLED TO, READ BACK BY AND VERIFIED WITH: HUNTER BARBEE AT 1620 12/23/21.PMF    Rotavirus A NOT DETECTED NOT DETECTED Final   Sapovirus (I, II, IV, and V) NOT DETECTED NOT DETECTED Final    Comment: Performed at [facility], [address] [place], [place] [address]  Resp Panel by RT-PCR (Flu A&B, Covid) Nasopharyngeal Swab     Status: None   Collection Time: 12/21/21  6:06 PM   Specimen: Nasopharyngeal Swab; Nasopharyngeal(NP) swabs in vial transport medium  Result Value Ref Range Status   SARS Coronavirus 2 by RT PCR NEGATIVE NEGATIVE Final    Comment: (NOTE) SARS-CoV-2 target nucleic acids are NOT DETECTED.  The SARS-CoV-2 RNA is generally detectable in upper respiratory specimens during the acute phase of infection. The lowest concentration of SARS-CoV-2 viral copies this assay can detect is 138 copies/mL. A negative result does not preclude SARS-Cov-2 infection and should not be used as the sole basis for treatment or other patient management decisions. A negative result may occur with  improper specimen collection/handling, submission of specimen other than nasopharyngeal swab, presence of viral mutation(s) within the areas targeted by this assay, and inadequate number of viral copies(<138 copies/mL). A negative result must be combined with clinical observations, patient history, and epidemiological information. The expected result is Negative.  Fact Sheet for Patients:  BloggerCourse.com  Fact Sheet for Healthcare Providers:  SeriousBroker.it  This test is no t yet approved or cleared by the [place] FDA and  has been authorized for  detection and/or diagnosis of SARS-CoV-2 by FDA under an Emergency Use Authorization (EUA). This EUA will remain  in effect (meaning this test can be used) for the duration of the COVID-19 declaration under Section 564(b)(1) of the Act, 21 U.S.C.section 360bbb-3(b)(1), unless the authorization is terminated  or revoked sooner.       Influenza A by PCR NEGATIVE NEGATIVE Final   Influenza B by PCR NEGATIVE NEGATIVE Final    Comment: (NOTE) The Xpert Xpress SARS-CoV-2/FLU/RSV plus assay is intended as an aid in the diagnosis of influenza from Nasopharyngeal swab specimens and should not be used as a sole basis for treatment. Nasal washings and aspirates are unacceptable for Xpert Xpress SARS-CoV-2/FLU/RSV testing.  Fact Sheet for Patients: BloggerCourse.com  Fact Sheet for Healthcare Providers: SeriousBroker.it  This test is not yet approved or cleared by the [place] FDA and has been authorized for detection and/or diagnosis of SARS-CoV-2 by FDA under an Emergency Use Authorization (EUA). This EUA will remain in effect (meaning this test can be used) for the duration of the COVID-19 declaration under Section 564(b)(1) of the Act, 21 U.S.C. section 360bbb-3(b)(1), unless the authorization is terminated or revoked.  Performed at [facility], [address], [place], [place] [address]     Scheduled Meds:  amLODipine  10 mg Oral Daily   cariprazine  6 mg Oral Daily   chlorhexidine  15 mL Mouth Rinse BID   cholestyramine light  4 g Oral BID   doxepin  100 mg Oral QHS   enoxaparin (LOVENOX) injection  60 mg Subcutaneous Q24H   folic acid  1 mg Oral Daily   gabapentin  1,200 mg Oral QHS   gabapentin  600 mg Oral Daily   Gerhardt's butt cream   Topical TID   mouth rinse  15 mL Mouth Rinse q12n4p   metoprolol tartrate  50 mg Oral BID   modafinil  200 mg Oral QAC breakfast   multivitamin with minerals  1  tablet Oral Daily   pantoprazole (PROTONIX) IV  40 mg Intravenous Q12H   tapentadol  200 mg Oral Q12H   And   tapentadol  50 mg Oral Q12H   tapentadol  100 mg Oral Q1400   thiamine  100 mg Oral Daily   vortioxetine HBr  20 mg Oral Daily   Continuous Infusions:  lactated ringers 1,000 mL with potassium chloride 40 mEq infusion 150 mL/hr at 12/24/21 0851   promethazine (PHENERGAN) injection (IM or IVPB) 12.5 mg (12/24/21 1042)     LOS: 2 days    Time spent: 30 minutes   Sharlene Dory, DO [facility] 12/24/2021, 12:31 PM   Available via Epic secure chat 7am-7pm After these hours, please refer to coverage provider listed on [email or web address]

## 2021-12-24 NOTE — Progress Notes (Signed)
The patient is receiving Protonix by the intravenous route.  Based on criteria approved by the Pharmacy and Therapeutics Committee and the Medical Executive Committee, the medication is being converted to the equivalent oral dose form.  These criteria include: -No active GI bleeding -Able to tolerate diet of full liquids (or better) or tube feeding -Able to tolerate other medications by the oral or enteral route  RN notified pharmacy patient no longer had IV access and requested medication be changed to PO route.  If you have any questions about this conversion, please contact the Pharmacy Department (phone [phone]).  Thank you.  Terrilee Files, PharmD

## 2021-12-25 DIAGNOSIS — A0811 Acute gastroenteropathy due to Norwalk agent: Principal | ICD-10-CM

## 2021-12-25 LAB — CBC
HCT: 35.7 % — ABNORMAL LOW (ref 36.0–46.0)
Hemoglobin: 11.7 g/dL — ABNORMAL LOW (ref 12.0–15.0)
MCH: 29.8 pg (ref 26.0–34.0)
MCHC: 32.8 g/dL (ref 30.0–36.0)
MCV: 91.1 fL (ref 80.0–100.0)
Platelets: 190 10*3/uL (ref 150–400)
RBC: 3.92 MIL/uL (ref 3.87–5.11)
RDW: 12.1 % (ref 11.5–15.5)
WBC: 5.2 10*3/uL (ref 4.0–10.5)
nRBC: 0 % (ref 0.0–0.2)

## 2021-12-25 LAB — BASIC METABOLIC PANEL
Anion gap: 5 (ref 5–15)
BUN: 7 mg/dL (ref 6–20)
CO2: 26 mmol/L (ref 22–32)
Calcium: 8.6 mg/dL — ABNORMAL LOW (ref 8.9–10.3)
Chloride: 108 mmol/L (ref 98–111)
Creatinine, Ser: 0.71 mg/dL (ref 0.44–1.00)
GFR, Estimated: >60 mL/min (ref >60)
Glucose, Bld: 80 mg/dL (ref 70–99)
Potassium: 4.1 mmol/L (ref 3.5–5.1)
Sodium: 139 mmol/L (ref 135–145)

## 2021-12-25 MED ORDER — LOPERAMIDE HCL 2 MG PO CAPS: 2.0000 mg | ORAL_CAPSULE | Freq: Four times a day (QID) | ORAL | 0 refills | Status: DC | PRN

## 2021-12-25 MED ORDER — ONDANSETRON HCL 4 MG PO TABS: 4.0000 mg | ORAL_TABLET | Freq: Three times a day (TID) | ORAL | 0 refills | Status: DC | PRN

## 2021-12-25 MED ORDER — GERHARDT'S BUTT CREAM: 1.0000 "application " | TOPICAL_CREAM | Freq: Three times a day (TID) | CUTANEOUS | 0 refills | Status: AC

## 2021-12-25 MED ORDER — DOXEPIN HCL 100 MG PO CAPS: 100.0000 mg | ORAL_CAPSULE | Freq: Every day | ORAL | 2 refills | Status: DC

## 2021-12-25 NOTE — Plan of Care (Signed)
°  Problem: Education: Goal: Knowledge of General Education information will improve Description: Including pain rating scale, medication(s)/side effects and non-pharmacologic comfort measures 12/25/2021 1242 by Mariann Laster, RN Outcome: Adequate for Discharge 12/25/2021 1128 by Mariann Laster, RN Outcome: Progressing   Problem: Health Behavior/Discharge Planning: Goal: Ability to manage health-related needs will improve 12/25/2021 1242 by Mariann Laster, RN Outcome: Adequate for Discharge 12/25/2021 1128 by Mariann Laster, RN Outcome: Progressing   Problem: Clinical Measurements: Goal: Ability to maintain clinical measurements within normal limits will improve Outcome: Adequate for Discharge Goal: Will remain free from infection 12/25/2021 1242 by Mariann Laster, RN Outcome: Adequate for Discharge 12/25/2021 1128 by Mariann Laster, RN Outcome: Progressing Goal: Diagnostic test results will improve Outcome: Adequate for Discharge   Problem: Activity: Goal: Risk for activity intolerance will decrease Outcome: Adequate for Discharge   Problem: Nutrition: Goal: Adequate nutrition will be maintained Outcome: Adequate for Discharge   Problem: Coping: Goal: Level of anxiety will decrease Outcome: Adequate for Discharge   Problem: Elimination: Goal: Will not experience complications related to bowel motility 12/25/2021 1242 by Mariann Laster, RN Outcome: Adequate for Discharge 12/25/2021 1128 by Mariann Laster, RN Outcome: Progressing Goal: Will not experience complications related to urinary retention Outcome: Adequate for Discharge   Problem: Pain Managment: Goal: General experience of comfort will improve Outcome: Adequate for Discharge   Problem: Safety: Goal: Ability to remain free from injury will improve Outcome: Adequate for Discharge   Problem: Skin Integrity: Goal: Risk for impaired skin integrity will decrease Outcome: Adequate for Discharge

## 2021-12-25 NOTE — Progress Notes (Signed)
Occupational Therapy Treatment Patient Details Name: Holly Roberts MRN: [id number] DOB: [birth year] Today's Date: 12/25/2021   History of present illness Holly Roberts is a 56 y.o. 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 Patient has been having 2 days of intractable nausea/vomiting and diarrhea. PMH: Bipolar, anxietym HTN, GERD, ARF   OT comments  Patient is at baseline for ADLs at this time. Patient is planning to d/c home today with daughter support. Patient is able to complete bed mobility and toileting MI on this date. Patient was re educated on using AE for LB dressing with SUP to complete with occasional cues. All questions were answered at this time. Patient's discharge plan remains appropriate at this time. OT will continue to follow acutely.     Recommendations for follow up therapy are one component of a multi-disciplinary discharge planning process, led by the attending physician.  Recommendations may be updated based on patient status, additional functional criteria and insurance authorization.    Follow Up Recommendations  No OT follow up    Assistance Recommended at Discharge Frequent or constant Supervision/Assistance  Patient can return home with the following  A little help with bathing/dressing/bathroom;Direct supervision/assist for financial management;Assistance with cooking/housework   Equipment Recommendations  None recommended by OT    Recommendations for Other Services      Precautions / Restrictions Precautions Precautions: Fall Restrictions Weight Bearing Restrictions: No       Mobility Bed Mobility Overal bed mobility: Modified Independent                  Transfers Overall transfer level: Modified independent                       Balance Overall balance assessment: Mild deficits observed, not formally tested                                         ADL either performed or assessed  with clinical judgement   ADL Overall ADL's : Needs assistance/impaired     Grooming: Wash/dry face;Oral care;Standing;Modified independent Grooming Details (indicate cue type and reason): with RW Upper Body Bathing: Minimal assistance;Sitting Upper Body Bathing Details (indicate cue type and reason): with cues to use AAROM to reach hard to reach areas.     Upper Body Dressing : Moderate assistance;Sitting Upper Body Dressing Details (indicate cue type and reason): hospital gown Lower Body Dressing: Supervision/safety;With adaptive equipment Lower Body Dressing Details (indicate cue type and reason): with cues for carryover of education. donning/doffing socks sitting EOB                    Extremity/Trunk Assessment              Vision       Perception     Praxis      Cognition Arousal/Alertness: Awake/alert Behavior During Therapy: WFL for tasks assessed/performed Overall Cognitive Status: Within Functional Limits for tasks assessed                                            Exercises     Shoulder Instructions       General Comments      Pertinent Vitals/ Pain  Pain Assessment: Faces Faces Pain Scale: Hurts little more Pain Location: stiffness when getting out of bed this AM Pain Descriptors / Indicators: Discomfort Pain Intervention(s): Monitored during session  Home Living                                          Prior Functioning/Environment              Frequency  Min 1X/week        Progress Toward Goals  OT Goals(current goals can now be found in the care plan section)  Progress towards OT goals: Progressing toward goals     Plan Discharge plan remains appropriate    Co-evaluation                 AM-PAC OT "6 Clicks" Daily Activity     Outcome Measure   Help from another person eating meals?: A Little Help from another person taking care of personal grooming?: A  Little Help from another person toileting, which includes using toliet, bedpan, or urinal?: A Little Help from another person bathing (including washing, rinsing, drying)?: A Little Help from another person to put on and taking off regular upper body clothing?: A Little Help from another person to put on and taking off regular lower body clothing?: A Little (with AE) 6 Click Score: 18    End of Session Equipment Utilized During Treatment: Rolling walker (2 wheels)  OT Visit Diagnosis: Unsteadiness on feet (R26.81);Pain   Activity Tolerance Patient tolerated treatment well   Patient Left in chair;with call bell/phone within reach;with chair alarm set   Nurse Communication Mobility status        Time: 6295-2841 OT Time Calculation (min): 28 min  Charges: OT General Charges $OT Visit: 1 Visit OT Treatments $Self Care/Home Management : 23-37 mins  Sharyn Blitz OTR/L, MS Acute Rehabilitation Department Office# [phone] Pager# [phone]   Ardyth Harps 12/25/2021, 9:24 AM

## 2021-12-25 NOTE — Plan of Care (Signed)
  Problem: Education: Goal: Knowledge of General Education information will improve Description: Including pain rating scale, medication(s)/side effects and non-pharmacologic comfort measures Outcome: Progressing   Problem: Health Behavior/Discharge Planning: Goal: Ability to manage health-related needs will improve Outcome: Progressing   Problem: Clinical Measurements: Goal: Will remain free from infection Outcome: Progressing   Problem: Elimination: Goal: Will not experience complications related to bowel motility Outcome: Progressing   

## 2021-12-25 NOTE — TOC Transition Note (Signed)
Transition of Care [facility]) - CM/SW Discharge Note   Patient Details  Name: Trilla Ellerbrock MRN: [id number] Date of Birth: [birth year]  Transition of [facility]) CM/SW Contact:  Leeroy Cha, RN Phone Number: 12/25/2021, 10:52 AM   Clinical Narrative:    Patient discharged to return home with self care.  No toc needs present.   Final next level of care: Home/Self Care Barriers to Discharge: Barriers Resolved   Patient Goals and CMS Choice Patient states their goals for this hospitalization and ongoing recovery are:: to go home CMS Medicare.gov Compare Post Acute Care list provided to:: Patient    Discharge Placement                       Discharge Plan and Services   Discharge Planning Services: CM Consult                                 Social Determinants of Health (SDOH) Interventions     Readmission Risk Interventions No flowsheet data found.

## 2021-12-25 NOTE — Discharge Summary (Signed)
Physician Discharge Summary  Holly Roberts XBM:[id number] DOB: 1966/02/11 DOA: 12/21/2021  PCP: Barbette Merino, NP  Admit date: 12/21/2021 Discharge date: 12/25/2021  Admitted From: Home  Discharge disposition: Home  Recommendations for Outpatient Follow-Up:   Follow up with your primary care provider in one week.  Check CBC, BMP, magnesium in the next visit  Discharge Diagnosis:   Principal Problem:   Intractable nausea and vomiting Active Problems:   Diarrhea   Tachycardia   Hypokalemia   Elevated troponin   Norovirus   Discharge Condition: Improved.  Diet recommendation: Low sodium, heart healthy.  Soft diet.  Wound care: None.  Code status: Full.   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  CT scan of the abdomen and pelvis was negative for acute findings.  Patient was then admitted hospital for intractable nausea vomiting and diarrhea.  GI was consulted but no further intervention was done. GI pathogen panel was positive for norovirus infection.  Hospital Course:   Following conditions were addressed during hospitalization as listed below,  Intractable nausea and vomiting, diarrhea secondary to  norovirus infection  Patient was seen by GI during hospitalization and recommended no further work-up.  Patient received aggressive IV fluid hydration.  Antiemetics were given.  Patient received Protonix twice daily.     Subsequently, patient had symptomatic improvement and was able to tolerate oral diet.  His CT scan of the abdomen  pelvis showed no acute intra-abdominal or intrapelvic process.  C diff was negative.  Essential hypertension Continue Norvasc, metoprolol.    Chronic pain Continue Nucynta and gabapentin from home.   Bipolar disorder Continue cariprazine, Trintellix, Ativan, doxepin and modafinil    Hypokalemia Potassium of 4.1 at this time..    Elevated troponin Mildly elevated but flat.  Disposition.  At this time, patient is stable for disposition home with outpatient PCP follow-up.  Medical Consultants:   GI  Procedures:    None Subjective:   Today, patient was seen and examined at bedside.  Patient feels better today.  No nausea vomiting abdominal pain or diarrhea.  Tolerating oral diet.  Discharge Exam:   Vitals:   12/25/21 0542 12/25/21 0828  BP: (!) 123/54 132/87  Pulse: 70   Resp:    Temp: 97.7 F (36.5 C)   SpO2: 98%    Vitals:   12/24/21 1444 12/24/21 1956 12/25/21 0542 12/25/21 0828  BP: 110/69 108/63 (!) 123/54 132/87  Pulse: 74 85 70   Resp: 14 13    Temp: 98.3 F (36.8 C) 98.6 F (37 C) 97.7 F (36.5 C)   TempSrc: Oral Oral Oral   SpO2: 93% 97% 98%   Weight:      Height:        General: Alert awake, not in obvious distress HENT: pupils equally reacting to light,  No scleral pallor or icterus noted. Oral mucosa is moist.  Chest:  Clear breath sounds.  Diminished breath sounds bilaterally. No crackles or wheezes.  CVS: S1 &S2 heard. No murmur.  Regular rate and rhythm. Abdomen: Soft, nontender, nondistended.  Bowel sounds are heard.   Extremities: No cyanosis, clubbing or edema.  Peripheral pulses  are palpable. Psych: Alert, awake and oriented, normal mood CNS:  No cranial nerve deficits.  Power equal in all extremities.   Skin: Warm and dry.  No rashes noted.  The results of significant diagnostics from this hospitalization (including imaging, microbiology, ancillary and laboratory) are listed below for reference.     Diagnostic Studies:   CT  ABDOMEN PELVIS W CONTRAST  Result Date: 12/21/2021 CLINICAL DATA:  Nausea, vomiting, diarrhea, abdominal pain for 2 days EXAM: CT ABDOMEN AND PELVIS WITH CONTRAST TECHNIQUE: Multidetector CT imaging of the abdomen and pelvis was performed using the standard protocol following bolus administration of intravenous contrast. CONTRAST:  80mL OMNIPAQUE IOHEXOL 350 MG/ML SOLN COMPARISON:  07/30/2021, 04/19/2021 FINDINGS: Lower chest: No acute pleural or parenchymal lung disease. Hepatobiliary: No focal liver abnormality is seen. Status post cholecystectomy. No biliary dilatation. Pancreas: Unremarkable. No pancreatic ductal dilatation or surrounding inflammatory changes. Spleen: Normal in size without focal abnormality. Adrenals/Urinary Tract: 9 mm right renal angiomyolipoma unchanged. Indeterminate 7 mm hypodensity left kidney image 29/2. No urinary tract calculi or obstructive uropathy within either kidney. The adrenals are unremarkable. Bladder is grossly normal. Stomach/Bowel: No bowel obstruction or ileus. No bowel wall thickening or inflammatory change. Vascular/Lymphatic: Aortic atherosclerosis. No enlarged abdominal or pelvic lymph nodes. Reproductive: Uterus and bilateral adnexa are unremarkable. Other: No free fluid or free gas.  No abdominal wall hernia. Musculoskeletal: No acute or destructive bony lesions. 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 Severe spondylosis at the L5-S1 level unchanged. Reconstructed images demonstrate no additional findings. IMPRESSION: 1. No acute intra-abdominal or intrapelvic process. 2. Indeterminate 7 mm hypodensity left kidney, unchanged since 04/19/2021. If further evaluation is desired, dedicated nonemergent renal MRI could be considered. 3. Stable small right renal angiomyolipoma. 4.  Aortic Atherosclerosis (ICD10-I70.0). Electronically Signed   By: Sharlet Salina M.D.   On: 12/21/2021 17:12     Labs:   Basic Metabolic  Panel: Recent Labs  Lab 12/21/21 1012 12/21/21 2133 12/22/21 0235 12/23/21 0404 12/24/21 0334 12/25/21 0343  NA 141  --  141 142 139 139  K 3.1*  --  3.4* 4.8 4.5 4.1  CL 108  --  115* 114* 112* 108  CO2 22  --  20* 15* 22 26  GLUCOSE 124*  --  115* 79 73 80  BUN 16  --  9 <5* <5* 7  CREATININE 0.71  --  0.79 0.81 0.72 0.71  CALCIUM 9.5  --  8.2* 9.3 8.5* 8.6*  MG  --  1.7  --   --   --   --    GFR Estimated Creatinine Clearance: 102 mL/min (by C-G formula based on SCr of 0.71 mg/dL). Liver Function Tests: Recent Labs  Lab 12/21/21 1012  AST 21  ALT 22  ALKPHOS 85  BILITOT 0.7  PROT 7.5  ALBUMIN 4.4   Recent Labs  Lab 12/21/21 1012  LIPASE 42   No results for input(s): AMMONIA in the last 168 hours. Coagulation profile No results for input(s): INR, PROTIME in the last 168 hours.  CBC: Recent Labs  Lab 12/21/21 1012 12/23/21 0404 12/24/21 0334 12/25/21 0343  WBC 8.9 8.0 4.7 5.2  HGB 15.1* 13.4 11.2* 11.7*  HCT 44.7 38.9 35.1* 35.7*  MCV 87.8 86.8 92.1 91.1  PLT 211 166 178 190   Cardiac Enzymes: No results for input(s): CKTOTAL, CKMB, CKMBINDEX, TROPONINI in the last 168 hours. BNP: Invalid input(s): POCBNP CBG: No results for input(s): GLUCAP in  the last 168 hours. D-Dimer No results for input(s): DDIMER in the last 72 hours. Hgb A1c No results for input(s): HGBA1C in the last 72 hours. Lipid Profile No results for input(s): CHOL, HDL, LDLCALC, TRIG, CHOLHDL, LDLDIRECT in the last 72 hours. Thyroid function studies No results for input(s): TSH, T4TOTAL, T3FREE, THYROIDAB in the last 72 hours.  Invalid input(s): FREET3 Anemia work up No results for input(s): VITAMINB12, FOLATE, FERRITIN, TIBC, IRON, RETICCTPCT in the last 72 hours. Microbiology Recent Results (from the past 240 hour(s))  C Difficile Quick Screen w PCR reflex     Status: None   Collection Time: 12/21/21  8:21 AM   Specimen: STOOL  Result Value Ref Range Status   C Diff  antigen NEGATIVE NEGATIVE Final   C Diff toxin NEGATIVE NEGATIVE Final   C Diff interpretation No C. difficile detected.  Final    Comment: Performed at [facility], [address] [place], [place] [address]  Gastrointestinal Panel by PCR , Stool     Status: Abnormal   Collection Time: 12/21/21  8:21 AM   Specimen: STOOL  Result Value Ref Range Status   Campylobacter species NOT DETECTED NOT DETECTED Final   Plesimonas shigelloides NOT DETECTED NOT DETECTED Final   Salmonella species NOT DETECTED NOT DETECTED Final   Yersinia enterocolitica NOT DETECTED NOT DETECTED Final   Vibrio species NOT DETECTED NOT DETECTED Final   Vibrio cholerae NOT DETECTED NOT DETECTED Final   Enteroaggregative E coli (EAEC) NOT DETECTED NOT DETECTED Final   Enteropathogenic E coli (EPEC) NOT DETECTED NOT DETECTED Final   Enterotoxigenic E coli (ETEC) NOT DETECTED NOT DETECTED Final   Shiga like toxin producing E coli (STEC) NOT DETECTED NOT DETECTED Final   Shigella/Enteroinvasive E coli (EIEC) NOT DETECTED NOT DETECTED Final   Cryptosporidium NOT DETECTED NOT DETECTED Final   Cyclospora cayetanensis NOT DETECTED NOT DETECTED Final   Entamoeba histolytica NOT DETECTED NOT DETECTED Final   Giardia lamblia NOT DETECTED NOT DETECTED Final   Adenovirus F40/41 NOT DETECTED NOT DETECTED Final   Astrovirus NOT DETECTED NOT DETECTED Final   Norovirus GI/GII DETECTED (A) NOT DETECTED Final    Comment: RESULT CALLED TO, READ BACK BY AND VERIFIED WITH: HUNTER BARBEE AT 1620 12/23/21.PMF    Rotavirus A NOT DETECTED NOT DETECTED Final   Sapovirus (I, II, IV, and V) NOT DETECTED NOT DETECTED Final    Comment: Performed at [facility], [address] [place], [place] [address]  Resp Panel by RT-PCR (Flu A&B, Covid) Nasopharyngeal Swab     Status: None   Collection Time: 12/21/21  6:06 PM   Specimen: Nasopharyngeal Swab; Nasopharyngeal(NP) swabs in vial transport medium  Result Value Ref  Range Status   SARS Coronavirus 2 by RT PCR NEGATIVE NEGATIVE Final    Comment: (NOTE) SARS-CoV-2 target nucleic acids are NOT DETECTED.  The SARS-CoV-2 RNA is generally detectable in upper respiratory specimens during the acute phase of infection. The lowest concentration of SARS-CoV-2 viral copies this assay can detect is 138 copies/mL. A negative result does not preclude SARS-Cov-2 infection and should not be used as the sole basis for treatment or other patient management decisions. A negative result may occur with  improper specimen collection/handling, submission of specimen other than nasopharyngeal swab, presence of viral mutation(s) within the areas targeted by this assay, and inadequate number of viral copies(<138 copies/mL). A negative result must be combined with clinical observations, patient history, and epidemiological information. The expected result  is Negative.  Fact Sheet for Patients:  BloggerCourse.com  Fact Sheet for Healthcare Providers:  SeriousBroker.it  This test is no t yet approved or cleared by the [place] FDA and  has been authorized for detection and/or diagnosis of SARS-CoV-2 by FDA under an Emergency Use Authorization (EUA). This EUA will remain  in effect (meaning this test can be used) for the duration of the COVID-19 declaration under Section 564(b)(1) of the Act, 21 U.S.C.section 360bbb-3(b)(1), unless the authorization is terminated  or revoked sooner.       Influenza A by PCR NEGATIVE NEGATIVE Final   Influenza B by PCR NEGATIVE NEGATIVE Final    Comment: (NOTE) The Xpert Xpress SARS-CoV-2/FLU/RSV plus assay is intended as an aid in the diagnosis of influenza from Nasopharyngeal swab specimens and should not be used as a sole basis for treatment. Nasal washings and aspirates are unacceptable for Xpert Xpress SARS-CoV-2/FLU/RSV testing.  Fact Sheet for  Patients: BloggerCourse.com  Fact Sheet for Healthcare Providers: SeriousBroker.it  This test is not yet approved or cleared by the [place] FDA and has been authorized for detection and/or diagnosis of SARS-CoV-2 by FDA under an Emergency Use Authorization (EUA). This EUA will remain in effect (meaning this test can be used) for the duration of the COVID-19 declaration under Section 564(b)(1) of the Act, 21 U.S.C. section 360bbb-3(b)(1), unless the authorization is terminated or revoked.  Performed at [facility], [address], [place], [place] [address]      Discharge Instructions:   Discharge Instructions     Call MD for:  persistant nausea and vomiting   Complete by: As directed    Call MD for:  severe uncontrolled pain   Complete by: As directed    Call MD for:  temperature >100.4   Complete by: As directed    Diet - low sodium heart healthy   Complete by: As directed    Soft diet   Discharge instructions   Complete by: As directed    Follow-up with your primary care physician in 1 week.  Soft diet for few days.  Increase fluid intake.  Seek medical attention for worsening symptoms.   Increase activity slowly   Complete by: As directed       Allergies as of 12/25/2021       Reactions   Buprenorphine Rash   Patch gave rash- poss adhesive issues. Tried generic and brand name both. Patient states: "I am not allergic to the medication. I am allergic to the adhesive, not the medication."    Amoxicillin-pot Clavulanate    Other reaction(s): Other (See Comments) States "strong" antibiotics cause C diff   Wound Dressing Adhesive    Rash   Sulfa Antibiotics Rash        Medication List     TAKE these medications    amLODipine 10 MG tablet Commonly known as: NORVASC Take 1 tablet (10 mg total) by mouth daily.   Armodafinil 250 MG tablet Take 250 mg by mouth every morning.    cholestyramine light 4 g packet Commonly known as: PREVALITE Take 1 packet (4 g total) by mouth 2 (two) times daily.   diclofenac 50 MG EC tablet Commonly known as: VOLTAREN Take 50 mg by mouth daily.   doxepin 100 MG capsule Commonly known as: SINEQUAN Take 1 capsule (100 mg total) by mouth at bedtime.   folic acid 1 MG tablet Commonly known as: FOLVITE Take 1 tablet (1 mg total) by mouth daily.  Gerhardt's butt cream Crea Apply 1 application topically 3 (three) times daily.   Horizant 600 MG Tbcr Generic drug: Gabapentin Enacarbil Take 600-1,200 mg by mouth in the morning and at bedtime. Take 1 tablet (600 mg) in the morning and Take 2 tablets (1200 mg) at bedtime   hydrOXYzine 10 MG tablet Commonly known as: ATARAX Take 1 tablet (10 mg total) by mouth 3 (three) times daily as needed. What changed: reasons to take this   loperamide 2 MG capsule Commonly known as: IMODIUM Take 1 capsule (2 mg total) by mouth every 6 (six) hours as needed for diarrhea or loose stools.   metoprolol tartrate 50 MG tablet Commonly known as: LOPRESSOR Take 1 tablet (50 mg total) by mouth 2 (two) times daily.   multivitamin with minerals Tabs tablet Take 1 tablet by mouth daily. What changed: additional instructions   Nucynta 100 MG Tabs Generic drug: Tapentadol HCl Take 2 tablets (200 mg total) by mouth daily in the afternoon. What changed:  how much to take additional instructions   Nucynta ER 200 MG Tb12 Generic drug: Tapentadol HCl Take 200 mg by mouth 2 (two) times daily. What changed: Another medication with the same name was changed. Make sure you understand how and when to take each.   Nucynta ER 250 MG Tb12 Generic drug: Tapentadol HCl Take 250 mg by mouth 2 (two) times daily. What changed: Another medication with the same name was changed. Make sure you understand how and when to take each.   nystatin powder Commonly known as: nystatin Apply 1 application topically 2  (two) times daily.   ondansetron 4 MG disintegrating tablet Commonly known as: Zofran ODT Take 1 tablet (4 mg total) by mouth every 8 (eight) hours as needed for nausea or vomiting.   ondansetron 4 MG tablet Commonly known as: ZOFRAN Take 1 tablet (4 mg total) by mouth every 8 (eight) hours as needed for nausea or vomiting.   pantoprazole 40 MG tablet Commonly known as: PROTONIX Take 1 tablet (40 mg total) by mouth daily.   Quviviq 25 MG Tabs Generic drug: Daridorexant HCl Take 25 tablets by mouth at bedtime.   thiamine 100 MG tablet Take 1 tablet (100 mg total) by mouth daily for 365 doses. What changed: how much to take   Trintellix 20 MG Tabs tablet Generic drug: vortioxetine HBr Take 20 mg by mouth daily.   vitamin B-12 100 MCG tablet Commonly known as: CYANOCOBALAMIN Take 1 tablet (100 mcg total) by mouth daily.   Vraylar 6 MG Caps Generic drug: Cariprazine HCl Take 6 mg by mouth daily.        Follow-up Information     Barbette Merino, NP Follow up in 1 week(s).   Specialty: Adult Health Nurse Practitioner Contact information: [address] [place] [address] [phone]                  Time coordinating discharge: 39 minutes  Signed:  Taiga Lupinacci  [facility] 12/25/2021, 1:13 PM

## 2021-12-25 NOTE — TOC Initial Note (Signed)
Transition of Care [facility]) - Initial/Assessment Note    Patient Details  Name: Holly Roberts MRN: [id number] Date of Birth: 03-28-1966  [facility] CM/SW Contact:    Golda Acre, RN Phone Number: 12/25/2021, 8:26 AM  Clinical Narrative:                  Transition of Care [facility]) Screening Note   Patient Details  Name: Holly Roberts Date of Birth: 07-Dec-1966   Transition of Care [facility]) CM/SW Contact:    Golda Acre, RN Phone Number: 12/25/2021, 8:26 AM    Transition of Care Department [facility]) has reviewed patient and no TOC needs have been identified at this time. We will continue to monitor patient advancement through interdisciplinary progression rounds. If new patient transition needs arise, please place a TOC consult.    Expected Discharge Plan: Home/Self Care Barriers to Discharge: Continued Medical Work up   Patient Goals and CMS Choice Patient states their goals for this hospitalization and ongoing recovery are:: to go home CMS Medicare.gov Compare Post Acute Care list provided to:: Patient    Expected Discharge Plan and Services Expected Discharge Plan: Home/Self Care   Discharge Planning Services: CM Consult   Living arrangements for the past 2 months: Apartment                                      Prior Living Arrangements/Services Living arrangements for the past 2 months: Apartment Lives with:: Self Patient language and need for interpreter reviewed:: Yes Do you feel safe going back to the place where you live?: Yes            Criminal Activity/Legal Involvement Pertinent to Current Situation/Hospitalization: No - Comment as needed  Activities of Daily Living Home Assistive Devices/Equipment: Walker (specify type) ADL Screening (condition at time of admission) Patient's cognitive ability adequate to safely complete daily activities?: Yes Is the patient deaf or have difficulty hearing?: No Does the patient have  difficulty seeing, even when wearing glasses/contacts?: No Does the patient have difficulty concentrating, remembering, or making decisions?: No Patient able to express need for assistance with ADLs?: Yes Does the patient have difficulty dressing or bathing?: Yes Independently performs ADLs?: No Communication: Independent Dressing (OT): Needs assistance Is this a change from baseline?: Pre-admission baseline Grooming: Independent Feeding: Independent Bathing: Needs assistance Is this a change from baseline?: Pre-admission baseline Toileting: Needs assistance Is this a change from baseline?: Pre-admission baseline In/Out Bed: Needs assistance Is this a change from baseline?: Pre-admission baseline Walks in Home: Needs assistance Is this a change from baseline?: Pre-admission baseline Does the patient have difficulty walking or climbing stairs?: Yes Weakness of Legs: Both Weakness of Arms/Hands: Both  Permission Sought/Granted                  Emotional Assessment Appearance:: Appears stated age Attitude/Demeanor/Rapport: Engaged Affect (typically observed): Calm Orientation: : Oriented to Self, Oriented to Place, Oriented to  Time, Oriented to Situation Alcohol / Substance Use: Not Applicable Psych Involvement: No (comment)  Admission diagnosis:  Nausea [R11.0] Nausea vomiting and diarrhea [R11.2, R19.7] Intractable nausea and vomiting [R11.2] Patient Active Problem List   Diagnosis Date Noted   Norovirus 12/24/2021   Diarrhea 12/22/2021   Tachycardia 12/22/2021   Hypokalemia 12/22/2021   Elevated troponin 12/22/2021   Intractable nausea and vomiting 12/21/2021   Neurocognitive deficits 08/14/2021   Bile acid malabsorption  syndrome 08/08/2021   Benign essential HTN 08/08/2021   Pressure injury of skin 07/31/2021   Acute encephalopathy 07/30/2021   AKI (acute kidney injury) (HCC) 06/09/2021   Hypoglycemia without diagnosis of diabetes mellitus 06/09/2021    Elevated lipase 06/09/2021   Bipolar disorder (HCC) 06/09/2021   Metabolic acidosis 06/09/2021   Vomiting and diarrhea 06/09/2021   Cholecystitis with cholelithiasis 04/19/2021   Anxiety attack 01/22/2017   Chronic fatigue 01/22/2017   Closed fracture of distal end of right radius with routine healing 03/08/2016   Chronic, continuous use of opioids 09/27/2015   Allergic rhinitis 11/09/2014   Menopause 11/09/2014   Obesity (BMI 35.0-39.9 without comorbidity) 11/09/2014   OSA (obstructive sleep apnea) 11/09/2014   Knee pain, bilateral 08/12/2014   Knee pain 08/12/2014   Acquired spondylolisthesis 06/27/2014   Depression 04/22/2014   Sprain of ankle 01/28/2014   Pain in joint, shoulder region 09/24/2013   Other intervertebral disc degeneration, lumbar region 09/01/2013   Chronic low back pain 12/30/2012   Mood disorder due to a general medical condition 12/30/2012   Osteoarthritis of knee 03/04/2012   Primary localized osteoarthritis of knees, bilateral 03/04/2012   Sleep disturbance 03/14/2011   Cysts of eyelids 03/07/2011   Synovitis and tenosynovitis 03/07/2011   Conjunctival hemorrhage 04/28/2010   Special screening for other conditions 02/13/2010   Benign paroxysmal positional vertigo 02/09/2010   Menopausal and postmenopausal disorder 01/26/2010   Other nonspecific finding on examination of urine 01/26/2010   Enlarged lymph nodes 10/07/2009   Encounter for removal of sutures 02/18/2009   Disorder of skin and subcutaneous tissue 02/08/2009   Sebaceous cyst 02/08/2009   Deficiency of other vitamins 01/26/2009   Intractable migraine with aura 01/26/2009   Screening for malignant neoplasm of cervix 01/26/2009   Lack of adequate sleep 06/10/2008   Other acute reactions to stress 06/10/2008   PCP:  Barbette Merino, NP Pharmacy:   [facility] [phone] - [place], [place] - [address] AT [facility] [address] [place]  [address] Phone: [phone] Fax: [phone]     Social Determinants of Health (SDOH) Interventions    Readmission Risk Interventions No flowsheet data found.

## 2022-01-05 ENCOUNTER — Other Ambulatory Visit: Payer: Self-pay

## 2022-01-05 ENCOUNTER — Encounter: Payer: Self-pay | Admitting: Nurse Practitioner

## 2022-01-05 ENCOUNTER — Ambulatory Visit: Payer: BC Managed Care – PPO | Admitting: Nurse Practitioner

## 2022-01-05 ENCOUNTER — Ambulatory Visit (INDEPENDENT_AMBULATORY_CARE_PROVIDER_SITE_OTHER): Payer: BC Managed Care – PPO | Admitting: Nurse Practitioner

## 2022-01-05 VITALS — BP 122/60 | HR 74 | Resp 16

## 2022-01-05 DIAGNOSIS — K9089 Other intestinal malabsorption: Secondary | ICD-10-CM | POA: Diagnosis not present

## 2022-01-05 DIAGNOSIS — R197 Diarrhea, unspecified: Secondary | ICD-10-CM

## 2022-01-05 MED ORDER — CHOLESTYRAMINE 4 GM/DOSE PO POWD: 8.0000 g | Freq: Two times a day (BID) | ORAL | 2 refills | Status: DC

## 2022-01-05 NOTE — Patient Instructions (Signed)
You were seen today in the [facility] for diarrhea. You were prescribed medications, please take as directed. Please follow up as needed.

## 2022-01-05 NOTE — Progress Notes (Signed)
[place], [place]  [id number] Phone:  [phone]   Fax:  [phone] Subjective:   Patient ID: Holly Roberts, female    DOB: 11/24/1966, 56 y.o.   MRN: [id number]  Chief Complaint  Patient presents with   Diarrhea    Milarose Gilster 56 y.o. female  has a past medical history of Anxiety, Bipolar disorder ([place]), Chronic pain, Depression, and Hypertension. To the [facility] for diarrhea.   Patient states that she was admitted to the hospital 1-2 wks ago fir diarrhea and nausea/ vomiting. After discharge, patient states that symptoms resolved, until yesterday when the diarrhea returned. States that she has had 5-6 bowel movements yesterday, describes stool as watery. States, "It feels like my rectum is coming out." Has been eating and drinking without difficulty. Has been compliant with all medications. Requesting increased dosage of Prevalite and longer prescription refill.   Denies any abdominal pain or nausea/ vomiting. Denies any fever. Denies any other complaints today. Denies any fatigue, chest pain, shortness of breath, HA or dizziness. Denies any blurred vision, numbness or tingling.  Past Medical History:  Diagnosis Date   Anxiety    Bipolar disorder ([place])    Chronic pain    Depression    Hypertension    patient denies    Past Surgical History:  Procedure Laterality Date   CHOLECYSTECTOMY N/A 04/20/2021   Procedure: LAPAROSCOPIC CHOLECYSTECTOMY WITH INTRAOPERATIVE CHOLANGIOGRAM;  Surgeon: Coralie Keens, MD;  Location: [facility];  Service: General;  Laterality: N/A;   Right knee surgery     TONSILLECTOMY      Family History  Problem Relation Age of Onset   Cancer Father     Social History   Socioeconomic History   Marital status: Single    Spouse name: Not on file   Number of children: Not on file   Years of education: Not on file   Highest education level: Not on file  Occupational History   Not on file  Tobacco Use    Smoking status: Never   Smokeless tobacco: Never  Vaping Use   Vaping Use: Never used  Substance and Sexual Activity   Alcohol use: Yes    Comment: rarely   Drug use: Never   Sexual activity: Not Currently  Other Topics Concern   Not on file  Social History Narrative   Not on file   Social Determinants of Health   Financial Resource Strain: Not on file  Food Insecurity: Not on file  Transportation Needs: Not on file  Physical Activity: Not on file  Stress: Not on file  Social Connections: Not on file  Intimate Partner Violence: Not on file    Outpatient Medications Prior to Visit  Medication Sig Dispense Refill   amLODipine (NORVASC) 10 MG tablet Take 1 tablet (10 mg total) by mouth daily. 90 tablet 3   Armodafinil 250 MG tablet Take 250 mg by mouth every morning.     diclofenac (VOLTAREN) 50 MG EC tablet Take 50 mg by mouth daily.     doxepin (SINEQUAN) 100 MG capsule Take 1 capsule (100 mg total) by mouth at bedtime. 30 capsule 2   folic acid (FOLVITE) 1 MG tablet Take 1 tablet (1 mg total) by mouth daily. 90 tablet 3   HORIZANT 600 MG TBCR Take 600-1,200 mg by mouth in the morning and at bedtime. Take 1 tablet (600 mg) in the morning and Take 2 tablets (1200 mg) at bedtime  Vitals reviewed.  Constitutional:      General: She is not in acute distress.    Appearance: Normal appearance. She is obese.  HENT:     Head: Normocephalic.  Cardiovascular:     Rate and Rhythm: Normal rate and regular rhythm.     Pulses: Normal pulses.     Heart sounds: Normal heart sounds.     Comments: No obvious peripheral edema Pulmonary:     Effort: Pulmonary effort is normal.     Breath sounds: Normal breath sounds.  Abdominal:     General: Abdomen is flat. Bowel sounds are normal. There is no distension.     Palpations: Abdomen is soft. There is no mass.     Tenderness: There is no abdominal tenderness. There is no right CVA tenderness, left CVA tenderness, guarding or rebound.     Hernia: No hernia is present.  Skin:    General: Skin is warm and dry.     Capillary Refill: Capillary refill takes less than 2 seconds.  Neurological:     General: No focal deficit present.     Mental Status: She is alert and oriented to person, place, and time.  Psychiatric:        Mood and Affect: Mood normal.        Behavior: Behavior normal.        Thought Content: Thought content normal.        Judgment: Judgment normal.    BP 122/60    Pulse 74    Resp 16    SpO2 92%  Wt Readings from Last 3 Encounters:  12/23/21 267 lb 6.4 oz (121.3 kg)  09/21/21 246 lb 9.6 oz (111.9 kg)  08/29/21 253 lb 6.4 oz (114.9 kg)    Immunization History  Administered Date(s) Administered    Influenza Inj Mdck Quad Pf 10/22/2016, 09/30/2017   Influenza Split 10/06/2015   Influenza,inj,Quad PF,6+ Mos 11/09/2014, 10/07/2018, 11/04/2019, 09/21/2021   Influenza-Unspecified July 30, 1966, 11/09/2014, 10/07/2018   Moderna SARS-COV2 Booster Vaccination 08/10/2021   Tdap [birth year]66, 10/06/2015   Zoster Recombinat (Shingrix) 11/04/2019    Diabetic Foot Exam - Simple   No data filed     No results found for: TSH Lab Results  Component Value Date   WBC 5.2 12/25/2021   HGB 11.7 (L) 12/25/2021   HCT 35.7 (L) 12/25/2021   MCV 91.1 12/25/2021   PLT 190 12/25/2021   Lab Results  Component Value Date   NA 139 12/25/2021   K 4.1 12/25/2021   CO2 26 12/25/2021   GLUCOSE 80 12/25/2021   BUN 7 12/25/2021   CREATININE 0.71 12/25/2021   BILITOT 0.7 12/21/2021   ALKPHOS 85 12/21/2021   AST 21 12/21/2021   ALT 22 12/21/2021   PROT 7.5 12/21/2021   ALBUMIN 4.4 12/21/2021   CALCIUM 8.6 (L) 12/25/2021   ANIONGAP 5 12/25/2021   No results found for: CHOL No results found for: HDL No results found for: [facility] Lab Results  Component Value Date   TRIG 75 07/31/2021   No results found for: [facility] Lab Results  Component Value Date   HGBA1C 4.9 09/21/2021   HGBA1C 4.9 09/21/2021   HGBA1C 4.9 (A) 09/21/2021   HGBA1C 4.9 09/21/2021       Assessment & Plan:   Problem List Items Addressed This Visit       Digestive   Bile acid malabsorption syndrome   Relevant Medications   cholestyramine (QUESTRAN) 4 GM/DOSE powder   Other Relevant Orders  [place], [place]  [id number] Phone:  [phone]   Fax:  [phone] Subjective:   Patient ID: Holly Roberts, female    DOB: 11/24/1966, 56 y.o.   MRN: [id number]  Chief Complaint  Patient presents with   Diarrhea    Milarose Gilster 56 y.o. female  has a past medical history of Anxiety, Bipolar disorder ([place]), Chronic pain, Depression, and Hypertension. To the [facility] for diarrhea.   Patient states that she was admitted to the hospital 1-2 wks ago fir diarrhea and nausea/ vomiting. After discharge, patient states that symptoms resolved, until yesterday when the diarrhea returned. States that she has had 5-6 bowel movements yesterday, describes stool as watery. States, "It feels like my rectum is coming out." Has been eating and drinking without difficulty. Has been compliant with all medications. Requesting increased dosage of Prevalite and longer prescription refill.   Denies any abdominal pain or nausea/ vomiting. Denies any fever. Denies any other complaints today. Denies any fatigue, chest pain, shortness of breath, HA or dizziness. Denies any blurred vision, numbness or tingling.  Past Medical History:  Diagnosis Date   Anxiety    Bipolar disorder ([place])    Chronic pain    Depression    Hypertension    patient denies    Past Surgical History:  Procedure Laterality Date   CHOLECYSTECTOMY N/A 04/20/2021   Procedure: LAPAROSCOPIC CHOLECYSTECTOMY WITH INTRAOPERATIVE CHOLANGIOGRAM;  Surgeon: Coralie Keens, MD;  Location: [facility];  Service: General;  Laterality: N/A;   Right knee surgery     TONSILLECTOMY      Family History  Problem Relation Age of Onset   Cancer Father     Social History   Socioeconomic History   Marital status: Single    Spouse name: Not on file   Number of children: Not on file   Years of education: Not on file   Highest education level: Not on file  Occupational History   Not on file  Tobacco Use    Smoking status: Never   Smokeless tobacco: Never  Vaping Use   Vaping Use: Never used  Substance and Sexual Activity   Alcohol use: Yes    Comment: rarely   Drug use: Never   Sexual activity: Not Currently  Other Topics Concern   Not on file  Social History Narrative   Not on file   Social Determinants of Health   Financial Resource Strain: Not on file  Food Insecurity: Not on file  Transportation Needs: Not on file  Physical Activity: Not on file  Stress: Not on file  Social Connections: Not on file  Intimate Partner Violence: Not on file    Outpatient Medications Prior to Visit  Medication Sig Dispense Refill   amLODipine (NORVASC) 10 MG tablet Take 1 tablet (10 mg total) by mouth daily. 90 tablet 3   Armodafinil 250 MG tablet Take 250 mg by mouth every morning.     diclofenac (VOLTAREN) 50 MG EC tablet Take 50 mg by mouth daily.     doxepin (SINEQUAN) 100 MG capsule Take 1 capsule (100 mg total) by mouth at bedtime. 30 capsule 2   folic acid (FOLVITE) 1 MG tablet Take 1 tablet (1 mg total) by mouth daily. 90 tablet 3   HORIZANT 600 MG TBCR Take 600-1,200 mg by mouth in the morning and at bedtime. Take 1 tablet (600 mg) in the morning and Take 2 tablets (1200 mg) at bedtime  Vitals reviewed.  Constitutional:      General: She is not in acute distress.    Appearance: Normal appearance. She is obese.  HENT:     Head: Normocephalic.  Cardiovascular:     Rate and Rhythm: Normal rate and regular rhythm.     Pulses: Normal pulses.     Heart sounds: Normal heart sounds.     Comments: No obvious peripheral edema Pulmonary:     Effort: Pulmonary effort is normal.     Breath sounds: Normal breath sounds.  Abdominal:     General: Abdomen is flat. Bowel sounds are normal. There is no distension.     Palpations: Abdomen is soft. There is no mass.     Tenderness: There is no abdominal tenderness. There is no right CVA tenderness, left CVA tenderness, guarding or rebound.     Hernia: No hernia is present.  Skin:    General: Skin is warm and dry.     Capillary Refill: Capillary refill takes less than 2 seconds.  Neurological:     General: No focal deficit present.     Mental Status: She is alert and oriented to person, place, and time.  Psychiatric:        Mood and Affect: Mood normal.        Behavior: Behavior normal.        Thought Content: Thought content normal.        Judgment: Judgment normal.    BP 122/60    Pulse 74    Resp 16    SpO2 92%  Wt Readings from Last 3 Encounters:  12/23/21 267 lb 6.4 oz (121.3 kg)  09/21/21 246 lb 9.6 oz (111.9 kg)  08/29/21 253 lb 6.4 oz (114.9 kg)    Immunization History  Administered Date(s) Administered    Influenza Inj Mdck Quad Pf 10/22/2016, 09/30/2017   Influenza Split 10/06/2015   Influenza,inj,Quad PF,6+ Mos 11/09/2014, 10/07/2018, 11/04/2019, 09/21/2021   Influenza-Unspecified July 30, 1966, 11/09/2014, 10/07/2018   Moderna SARS-COV2 Booster Vaccination 08/10/2021   Tdap 02/11/66, 10/06/2015   Zoster Recombinat (Shingrix) 11/04/2019    Diabetic Foot Exam - Simple   No data filed     No results found for: TSH Lab Results  Component Value Date   WBC 5.2 12/25/2021   HGB 11.7 (L) 12/25/2021   HCT 35.7 (L) 12/25/2021   MCV 91.1 12/25/2021   PLT 190 12/25/2021   Lab Results  Component Value Date   NA 139 12/25/2021   K 4.1 12/25/2021   CO2 26 12/25/2021   GLUCOSE 80 12/25/2021   BUN 7 12/25/2021   CREATININE 0.71 12/25/2021   BILITOT 0.7 12/21/2021   ALKPHOS 85 12/21/2021   AST 21 12/21/2021   ALT 22 12/21/2021   PROT 7.5 12/21/2021   ALBUMIN 4.4 12/21/2021   CALCIUM 8.6 (L) 12/25/2021   ANIONGAP 5 12/25/2021   No results found for: CHOL No results found for: HDL No results found for: [facility] Lab Results  Component Value Date   TRIG 75 07/31/2021   No results found for: [facility] Lab Results  Component Value Date   HGBA1C 4.9 09/21/2021   HGBA1C 4.9 09/21/2021   HGBA1C 4.9 (A) 09/21/2021   HGBA1C 4.9 09/21/2021       Assessment & Plan:   Problem List Items Addressed This Visit       Digestive   Bile acid malabsorption syndrome   Relevant Medications   cholestyramine (QUESTRAN) 4 GM/DOSE powder   Other Relevant Orders

## 2022-01-10 ENCOUNTER — Ambulatory Visit: Payer: BC Managed Care – PPO | Admitting: Nurse Practitioner

## 2022-01-10 ENCOUNTER — Other Ambulatory Visit: Payer: Self-pay

## 2022-01-10 ENCOUNTER — Encounter: Payer: Self-pay | Admitting: Nurse Practitioner

## 2022-01-10 ENCOUNTER — Ambulatory Visit (INDEPENDENT_AMBULATORY_CARE_PROVIDER_SITE_OTHER): Payer: BC Managed Care – PPO | Admitting: Nurse Practitioner

## 2022-01-10 VITALS — BP 100/53 | HR 90 | Temp 97.0°F | Ht 64.0 in | Wt 242.0 lb

## 2022-01-10 DIAGNOSIS — E876 Hypokalemia: Secondary | ICD-10-CM

## 2022-01-10 DIAGNOSIS — R197 Diarrhea, unspecified: Secondary | ICD-10-CM

## 2022-01-10 NOTE — Progress Notes (Signed)
[facility] Patient [facility] [address] [place], [place]  [address] Phone:  [phone]   Fax:  [phone]   Established Patient Office Visit  Subjective:  Patient ID: Holly Roberts, female    DOB: [birth year]  Age: 56 y.o. MRN: [id number]  CC:  Chief Complaint  Patient presents with   Follow-up    Pt is here today for her follow up visit.  Pt was seen on 01/05/22 for uncontrolled diarrhea and she was prescribed the cholestyramine powder 8g but her insurance denied it even with a prior authorization. Pt daughter states that her mother is not eating a lot because she is afraid she will have to use the bathroom. Pt was referred to [facility] but is waiting for them to call and schedule her an appt.    HPI Holly Roberts presents for follow up. She  has a past medical history of Anxiety, Bipolar disorder ([facility]), Chronic pain, Depression, and Hypertension.   She is following up today for persistent diarrhea.  She was prescribed Cholestyramine powder 8 g however has not been able to start this due to insurance denial.  She is currently on Imodium 2 mg.  She was prescribed this every 6 hours however her daughter reports that she was taking it with each bowel movement and she was afraid to allow her to continue.  She reports that she has decreased her oral intake to avoid making her diarrhea worse however this has not been effective.  She was diagnosed with norovirus while being hospitalized December 21, 2021.  She has a referral placed for gastroenterology for further evaluation and treatment options along with colonoscopy. She has been out of work and is requesting a return to work note.  She reports that she does have some weakness due to the diarrhea and decreased intake. Denies headache, dizziness, visual changes, shortness of breath, dyspnea on exertion, chest pain, nausea, vomiting or any edema.   Past Medical History:  Diagnosis Date   Anxiety    Bipolar disorder ([facility])    Chronic pain     Depression    Hypertension    patient denies    Past Surgical History:  Procedure Laterality Date   CHOLECYSTECTOMY N/A 04/20/2021   Procedure: LAPAROSCOPIC CHOLECYSTECTOMY WITH INTRAOPERATIVE CHOLANGIOGRAM;  Surgeon: [name], MD;  Location: [facility];  Service: General;  Laterality: N/A;   Right knee surgery     TONSILLECTOMY      Family History  Problem Relation Age of Onset   Cancer Father     Social History   Socioeconomic History   Marital status: Single    Spouse name: Not on file   Number of children: Not on file   Years of education: Not on file   Highest education level: Not on file  Occupational History   Not on file  Tobacco Use   Smoking status: Never   Smokeless tobacco: Never  Vaping Use   Vaping Use: Some days   Substances: CBD  Substance and Sexual Activity   Alcohol use: Yes    Comment: rarely   Drug use: Never   Sexual activity: Not Currently  Other Topics Concern   Not on file  Social History Narrative   Not on file   Social Determinants of Health   Financial Resource Strain: Not on file  Food Insecurity: Not on file  Transportation Needs: Not on file  Physical Activity: Not on file  Stress: Not on file  Social Connections: Not on  file  Intimate Partner Violence: Not on file    Outpatient Medications Prior to Visit  Medication Sig Dispense Refill   amLODipine (NORVASC) 10 MG tablet Take 1 tablet (10 mg total) by mouth daily. 90 tablet 3   Armodafinil 250 MG tablet Take 250 mg by mouth every morning.     diclofenac (VOLTAREN) 50 MG EC tablet Take 50 mg by mouth daily.     doxepin (SINEQUAN) 100 MG capsule Take 1 capsule (100 mg total) by mouth at bedtime. 30 capsule 2   folic acid (FOLVITE) 1 MG tablet Take 1 tablet (1 mg total) by mouth daily. 90 tablet 3   HORIZANT 600 MG TBCR Take 600-1,200 mg by mouth in the morning and at bedtime. Take 1 tablet (600 mg) in the morning and Take 2 tablets (1200 mg) at bedtime     hydrOXYzine  (ATARAX/VISTARIL) 10 MG tablet Take 1 tablet (10 mg total) by mouth 3 (three) times daily as needed. 30 tablet 3   metoprolol tartrate (LOPRESSOR) 50 MG tablet Take 1 tablet (50 mg total) by mouth 2 (two) times daily. 180 tablet 3   Multiple Vitamin (MULTIVITAMIN WITH MINERALS) TABS tablet Take 1 tablet by mouth daily. (Patient taking differently: Take 1 tablet by mouth daily. Centrum)     NUCYNTA 100 MG TABS Take 2 tablets (200 mg total) by mouth daily in the afternoon. (Patient taking differently: Take 100 mg by mouth daily in the afternoon. Afternoon) 60 tablet 0   NUCYNTA ER 200 MG TB12 Take 200 mg by mouth 2 (two) times daily. 60 tablet 0   NUCYNTA ER 250 MG TB12 Take 250 mg by mouth 2 (two) times daily.     ondansetron (ZOFRAN ODT) 4 MG disintegrating tablet Take 1 tablet (4 mg total) by mouth every 8 (eight) hours as needed for nausea or vomiting. 20 tablet 0   ondansetron (ZOFRAN) 4 MG tablet Take 1 tablet (4 mg total) by mouth every 8 (eight) hours as needed for nausea or vomiting. 20 tablet 0   pantoprazole (PROTONIX) 40 MG tablet Take 1 tablet (40 mg total) by mouth daily. 90 tablet 3   QUVIVIQ 25 MG TABS Take 25 tablets by mouth at bedtime.     thiamine 100 MG tablet Take 1 tablet (100 mg total) by mouth daily for 365 doses. (Patient taking differently: Take 250 mg by mouth daily.) 90 tablet 3   TRINTELLIX 20 MG TABS tablet Take 20 mg by mouth daily.     VRAYLAR 6 MG CAPS Take 6 mg by mouth daily.     cholestyramine (QUESTRAN) 4 GM/DOSE powder Take 2 packets (8 g total) by mouth 2 (two) times daily with a meal. (Patient not taking: Reported on 01/10/2022) 120 packet 2   loperamide (IMODIUM) 2 MG capsule Take 1 capsule (2 mg total) by mouth every 6 (six) hours as needed for diarrhea or loose stools. (Patient not taking: Reported on 01/10/2022) 30 capsule 0   Nystatin (GERHARDT'S BUTT CREAM) CREA Apply 1 application topically 3 (three) times daily. (Patient not taking: Reported on 01/10/2022)  90 each 0   nystatin powder Apply 1 application topically 2 (two) times daily. (Patient not taking: Reported on 01/10/2022) 15 g 0   vitamin B-12 (CYANOCOBALAMIN) 100 MCG tablet Take 1 tablet (100 mcg total) by mouth daily. (Patient not taking: Reported on 01/10/2022) 90 tablet 3   No facility-administered medications prior to visit.    Allergies  Allergen Reactions   Buprenorphine Rash  Patch gave rash- poss adhesive issues. Tried generic and brand name both. Patient states: "I am not allergic to the medication. I am allergic to the adhesive, not the medication."    Amoxicillin-Pot Clavulanate     Other reaction(s): Other (See Comments) States "strong" antibiotics cause C diff   Wound Dressing Adhesive     Rash   Sulfa Antibiotics Rash    ROS Review of Systems    Objective:    Physical Exam Constitutional:      Appearance: She is obese.  HENT:     Head: Normocephalic and atraumatic.     Nose: Nose normal.     Mouth/Throat:     Mouth: Mucous membranes are moist.  Cardiovascular:     Rate and Rhythm: Normal rate and regular rhythm.     Pulses: Normal pulses.     Heart sounds: Normal heart sounds.  Pulmonary:     Effort: Pulmonary effort is normal.     Breath sounds: Normal breath sounds.  Musculoskeletal:        General: Normal range of motion.     Cervical back: Normal range of motion.     Right lower leg: No edema.     Left lower leg: No edema.  Skin:    General: Skin is warm and dry.     Capillary Refill: Capillary refill takes less than 2 seconds.  Neurological:     General: No focal deficit present.     Mental Status: She is alert and oriented to person, place, and time.  Psychiatric:     Comments: Increased quarreling between her and daughter related to best treatment options    BP (!) 100/53    Pulse 90    Temp (!) 97 F (36.1 C)    Ht 5\' 4"  (1.626 m)    Wt 242 lb (109.8 kg)    SpO2 95%    BMI 41.54 kg/m  Wt Readings from Last 3 Encounters:  01/10/22  242 lb (109.8 kg)  12/23/21 267 lb 6.4 oz (121.3 kg)  09/21/21 246 lb 9.6 oz (111.9 kg)     Health Maintenance Due  Topic Date Due   Hepatitis C Screening  Never done   PAP SMEAR-Modifier  Never done   Zoster Vaccines- Shingrix (2 of 2) 12/30/2019   COVID-19 Vaccine (3 - Booster for Pfizer series) 10/05/2021    There are no preventive care reminders to display for this patient.  No results found for: TSH Lab Results  Component Value Date   WBC 5.2 12/25/2021   HGB 11.7 (L) 12/25/2021   HCT 35.7 (L) 12/25/2021   MCV 91.1 12/25/2021   PLT 190 12/25/2021   Lab Results  Component Value Date   NA 139 12/25/2021   K 4.1 12/25/2021   CO2 26 12/25/2021   GLUCOSE 80 12/25/2021   BUN 7 12/25/2021   CREATININE 0.71 12/25/2021   BILITOT 0.7 12/21/2021   ALKPHOS 85 12/21/2021   AST 21 12/21/2021   ALT 22 12/21/2021   PROT 7.5 12/21/2021   ALBUMIN 4.4 12/21/2021   CALCIUM 8.6 (L) 12/25/2021   ANIONGAP 5 12/25/2021   No results found for: CHOL No results found for: HDL No results found for: [facility] Lab Results  Component Value Date   TRIG 75 07/31/2021   No results found for: [facility] Lab Results  Component Value Date   HGBA1C 4.9 09/21/2021   HGBA1C 4.9 09/21/2021   HGBA1C 4.9 (A) 09/21/2021   HGBA1C 4.9 09/21/2021  Assessment & Plan:   Problem List Items Addressed This Visit       Other   Diarrhea - Primary Persistent Referral to gastroenterology revisited Encourage patient to continue with Imodium Discussed with patient other treatment options for diarrhea however she does not qualify for octreotide therapy   Relevant Orders   Comp. Metabolic Panel (12) (Completed)   Hypokalemia Reevaluation pending   Relevant Orders   Comp. Metabolic Panel (12) (Completed)    No orders of the defined types were placed in this encounter.   Follow-up: Return in about 6 months (around 07/10/2022) for Physcial VISIT,EST,40-64 [[id number]].    Barbette Merino, NP

## 2022-01-10 NOTE — Patient Instructions (Signed)
Diarrhea, Adult °Diarrhea is when you pass loose and watery poop (stool) often. Diarrhea can make you feel weak and cause you to lose water in your body (get dehydrated). Losing water in your body can cause you to: °Feel tired and thirsty. °Have a dry mouth. °Go pee (urinate) less often. °Diarrhea often lasts 2-3 days. However, it can last longer if it is a sign of something more serious. It is important to treat your diarrhea as told by your doctor. °Follow these instructions at home: °Eating and drinking °  °Follow these instructions as told by your doctor: °Take an ORS (oral rehydration solution). This is a drink that helps you replace fluids and minerals your body lost. It is sold at pharmacies and stores. °Drink plenty of fluids, such as: °Water. °Ice chips. °Diluted fruit juice. °Low-calorie sports drinks. °Milk, if you want. °Avoid drinking fluids that have a lot of sugar or caffeine in them. °Eat bland, easy-to-digest foods in small amounts as you are able. These foods include: °Bananas. °Applesauce. °Rice. °Low-fat (lean) meats. °Toast. °Crackers. °Avoid alcohol. °Avoid spicy or fatty foods. ° °Medicines °Take over-the-counter and prescription medicines only as told by your doctor. °If you were prescribed an antibiotic medicine, take it as told by your doctor. Do not stop using the antibiotic even if you start to feel better. °General instructions ° °Wash your hands often using soap and water. If soap and water are not available, use a hand sanitizer. Others in your home should wash their hands as well. Hands should be washed: °After using the toilet or changing a diaper. °Before preparing, cooking, or serving food. °While caring for a sick person. °While visiting someone in a hospital. °Drink enough fluid to keep your pee (urine) pale yellow. °Rest at home while you get better. °Take a warm bath to help with any burning or pain from having diarrhea. °Watch your condition for any changes. °Keep all  follow-up visits as told by your doctor. This is important. °Contact a doctor if: °You have a fever. °Your diarrhea gets worse. °You have new symptoms. °You cannot keep fluids down. °You feel light-headed or dizzy. °You have a headache. °You have muscle cramps. °Get help right away if: °You have chest pain. °You feel very weak or you pass out (faint). °You have bloody or black poop or poop that looks like tar. °You have very bad pain, cramping, or bloating in your belly (abdomen). °You have trouble breathing or you are breathing very quickly. °Your heart is beating very quickly. °Your skin feels cold and clammy. °You feel confused. °You have signs of losing too much water in your body, such as: °Dark pee, very little pee, or no pee. °Cracked lips. °Dry mouth. °Sunken eyes. °Sleepiness. °Weakness. °Summary °Diarrhea is when you pass loose and watery poop (stool) often. °Diarrhea can make you feel weak and cause you to lose water in your body (get dehydrated). °Take an ORS (oral rehydration solution). This is a drink that is sold at pharmacies and stores. °Eat bland, easy-to-digest foods in small amounts as you are able. °Contact a doctor if your condition gets worse. Get help right away if you have signs that you have lost too much water in your body. °This information is not intended to replace advice given to you by your health care provider. Make sure you discuss any questions you have with your health care provider. °Document Revised: 06/14/2021 Document Reviewed: 06/14/2021 °Elsevier Patient Education © 2022 [employer] ° °

## 2022-01-11 ENCOUNTER — Encounter: Payer: Self-pay | Admitting: Nurse Practitioner

## 2022-01-11 LAB — COMP. METABOLIC PANEL (12)
AST: 16 IU/L (ref 0–40)
Albumin/Globulin Ratio: 1.9 (ref 1.2–2.2)
Albumin: 4.8 g/dL (ref 3.8–4.9)
Alkaline Phosphatase: 232 IU/L — ABNORMAL HIGH (ref 44–121)
BUN/Creatinine Ratio: 9 (ref 9–23)
BUN: 8 mg/dL (ref 6–24)
Bilirubin Total: <0.2 mg/dL (ref 0.0–1.2)
Calcium: 9.8 mg/dL (ref 8.7–10.2)
Chloride: 101 mmol/L (ref 96–106)
Creatinine, Ser: 0.87 mg/dL (ref 0.57–1.00)
Globulin, Total: 2.5 g/dL (ref 1.5–4.5)
Glucose: 85 mg/dL (ref 70–99)
Potassium: 4.6 mmol/L (ref 3.5–5.2)
Sodium: 142 mmol/L (ref 134–144)
Total Protein: 7.3 g/dL (ref 6.0–8.5)
eGFR: 79 mL/min/{1.73_m2} (ref >59)

## 2022-04-07 ENCOUNTER — Other Ambulatory Visit: Payer: Self-pay | Admitting: Nurse Practitioner

## 2022-04-19 ENCOUNTER — Telehealth: Payer: Self-pay

## 2022-04-19 NOTE — Telephone Encounter (Signed)
cholestyramine (QUESTRAN) 4 g packet ? ?PA started on 04/19/22 ?

## 2022-04-24 ENCOUNTER — Telehealth: Payer: Self-pay

## 2022-04-24 NOTE — Telephone Encounter (Signed)
Pt is asking for a can (instead of powder)for her cholestyramine her insurance wont cover the powder ?

## 2022-05-09 ENCOUNTER — Ambulatory Visit (INDEPENDENT_AMBULATORY_CARE_PROVIDER_SITE_OTHER): Payer: BC Managed Care – PPO | Admitting: Gastroenterology

## 2022-05-09 ENCOUNTER — Other Ambulatory Visit (INDEPENDENT_AMBULATORY_CARE_PROVIDER_SITE_OTHER): Payer: BC Managed Care – PPO

## 2022-05-09 ENCOUNTER — Encounter: Payer: Self-pay | Admitting: Gastroenterology

## 2022-05-09 VITALS — BP 120/82 | HR 64 | Ht 64.0 in | Wt 262.5 lb

## 2022-05-09 DIAGNOSIS — Z1211 Encounter for screening for malignant neoplasm of colon: Secondary | ICD-10-CM

## 2022-05-09 DIAGNOSIS — R197 Diarrhea, unspecified: Secondary | ICD-10-CM

## 2022-05-09 DIAGNOSIS — D649 Anemia, unspecified: Secondary | ICD-10-CM

## 2022-05-09 DIAGNOSIS — R748 Abnormal levels of other serum enzymes: Secondary | ICD-10-CM | POA: Diagnosis not present

## 2022-05-09 LAB — HEPATIC FUNCTION PANEL
ALT: 16 U/L (ref 0–35)
AST: 17 U/L (ref 0–37)
Albumin: 4.6 g/dL (ref 3.5–5.2)
Alkaline Phosphatase: 103 U/L (ref 39–117)
Bilirubin, Direct: 0.1 mg/dL (ref 0.0–0.3)
Total Bilirubin: 0.5 mg/dL (ref 0.2–1.2)
Total Protein: 7.1 g/dL (ref 6.0–8.3)

## 2022-05-09 LAB — CBC
HCT: 38.4 % (ref 36.0–46.0)
Hemoglobin: 13 g/dL (ref 12.0–15.0)
MCHC: 33.9 g/dL (ref 30.0–36.0)
MCV: 88.2 fl (ref 78.0–100.0)
Platelets: 219 10*3/uL (ref 150.0–400.0)
RBC: 4.36 Mil/uL (ref 3.87–5.11)
RDW: 12.8 % (ref 11.5–15.5)
WBC: 6.4 10*3/uL (ref 4.0–10.5)

## 2022-05-09 MED ORDER — CLENPIQ 10-3.5-12 MG-GM -GM/160ML PO SOLN: 1.0000 | ORAL | 0 refills | Status: DC

## 2022-05-09 MED ORDER — CHOLESTYRAMINE 4 GM/DOSE PO POWD: 4.0000 g | Freq: Two times a day (BID) | ORAL | 12 refills | Status: DC

## 2022-05-09 NOTE — Patient Instructions (Addendum)
If you are age 56 or younger, your body mass index should be between 19-25. Your Body mass index is 45.06 kg/m. If this is out of the aformentioned range listed, please consider follow up with your Primary Care Provider.   __________________________________________________________  The [facility] providers would like to encourage you to use [facility] to communicate with providers for non-urgent requests or questions.  Due to long hold times on the telephone, sending your provider a message by [facility] may be a faster and more efficient way to get a response.  Please allow 48 business hours for a response.  Please remember that this is for non-urgent requests.    Due to recent changes in healthcare laws, you may see the results of your imaging and laboratory studies on MyChart before your provider has had a chance to review them.  We understand that in some cases there may be results that are confusing or concerning to you. Not all laboratory results come back in the same time frame and the provider may be waiting for multiple results in order to interpret others.  Please give [place] 48 hours in order for your provider to thoroughly review all the results before contacting the office for clarification of your results.    Your provider has requested that you go to the basement level for lab work before leaving today. Press "B" on the elevator. The lab is located at the first door on the left as you exit the elevator.   We have sent the following medications to your pharmacy for you to pick up at your convenience: Clenpiq, Cholestyramine Powder  You have been scheduled for a colonoscopy. Please follow written instructions given to you at your visit today.  Please pick up your prep supplies at the pharmacy within the next 1-3 days. If you use inhalers (even only as needed), please bring them with you on the day of your procedure.    Thank you for choosing me and  Gastroenterology.  Vito Cirigliano,  D.O.

## 2022-05-09 NOTE — Progress Notes (Signed)
Chief Complaint: Diarrhea   Referring Provider:     Orion CrookPassmore, Tewana I, NP   HPI:     Holly Roberts is a 56 y.o. female with a history of anxiety, bipolar disorder, depression, HTN, chronic pain, cholecystectomy 04/2021, referred to the [facility] for evaluation of diarrhea.  She reports a history of diarrhea starting after prior cholecystectomy in 04/2019.  Symptoms improved with cholestyramine powder 4 g BID, but having issues with insurance approval of this efficacious medication.  Does use Imodium 2 mg when she does not have access to cholestyramine.  Was hospitalized in 12/2021 with norovirus.  Remainder of GI PCR panel and C. difficile were negative.  CT abdomen/pelvis with o/w normal-appearing GI tract, liver, pancreas.  Normal H/H on admission, but slowly down trended during her hospitalization.  Labs after discharge on 01/10/2022 notable for elevated ALP (232), with otherwise normal AST, T. bili and renal function.  No prior EGD or colonoscopy.   Mother with diverticulititis and AIH,  and MGF with UC. No known family history of CRC, GI malignancy, liver disease, pancreatic disease.   Presents with daughter who helps provide her medical care.      Latest Ref Rng & Units 01/10/2022    4:29 PM 12/25/2021    3:43 AM 12/24/2021    3:34 AM  CMP  Glucose 70 - 99 mg/dL 85   80   73    BUN 6 - 24 mg/dL 8   7   <5    Creatinine 0.57 - 1.00 mg/dL 9.140.87   7.820.71   9.560.72    Sodium 134 - 144 mmol/L 142   139   139    Potassium 3.5 - 5.2 mmol/L 4.6   4.1   4.5    Chloride 96 - 106 mmol/L 101   108   112    CO2 22 - 32 mmol/L  26   22    Calcium 8.7 - 10.2 mg/dL 9.8   8.6   8.5    Total Protein 6.0 - 8.5 g/dL 7.3      Total Bilirubin 0.0 - 1.2 mg/dL <2.1<0.2      Alkaline Phos 44 - 121 IU/L 232      AST 0 - 40 IU/L 16          Past Medical History:  Diagnosis Date   Anxiety    Arthritis    Bipolar disorder (HCC)    Chronic pain    Depression    GERD  (gastroesophageal reflux disease)    Hypertension    patient denies     Past Surgical History:  Procedure Laterality Date   CHOLECYSTECTOMY N/A 04/20/2021   Procedure: LAPAROSCOPIC CHOLECYSTECTOMY WITH INTRAOPERATIVE CHOLANGIOGRAM;  Surgeon: Abigail MiyamotoBlackman, Douglas, MD;  Location: [facility];  Service: General;  Laterality: N/A;   Right knee surgery     TONSILLECTOMY     Family History  Problem Relation Age of Onset   Diverticulitis Mother    Autoimmune disease Mother        heptatic   Heart attack Mother        x2   Cancer Father    Pancreatic cancer Maternal Grandmother    Ulcerative colitis Maternal Grandfather    Colon cancer Neg Hx    Esophageal cancer Neg Hx    Social History   Tobacco Use   Smoking status: Never   Smokeless tobacco: Never  Vaping  Use   Vaping Use: Some days   Substances: CBD  Substance Use Topics   Alcohol use: Yes    Comment: rarely   Drug use: Never   Current Outpatient Medications  Medication Sig Dispense Refill   amLODipine (NORVASC) 10 MG tablet Take 1 tablet (10 mg total) by mouth daily. 90 tablet 3   Armodafinil 250 MG tablet Take 250 mg by mouth every morning.     cholestyramine (QUESTRAN) 4 g packet TAKE 2 PACKETS (8 G TOTAL) BY MOUTH 2 (TWO) TIMES DAILY WITH A MEAL. 120 packet 1   diclofenac (VOLTAREN) 50 MG EC tablet Take 50 mg by mouth daily.     doxepin (SINEQUAN) 100 MG capsule Take 1 capsule (100 mg total) by mouth at bedtime. 30 capsule 2   folic acid (FOLVITE) 1 MG tablet Take 1 tablet (1 mg total) by mouth daily. 90 tablet 3   HORIZANT 600 MG TBCR Take 600-1,200 mg by mouth in the morning and at bedtime. Take 1 tablet (600 mg) in the morning and Take 2 tablets (1200 mg) at bedtime     hydrOXYzine (ATARAX/VISTARIL) 10 MG tablet Take 1 tablet (10 mg total) by mouth 3 (three) times daily as needed. 30 tablet 3   loperamide (IMODIUM) 2 MG capsule Take 1 capsule (2 mg total) by mouth every 6 (six) hours as needed for diarrhea or loose stools.  30 capsule 0   metoprolol tartrate (LOPRESSOR) 50 MG tablet Take 1 tablet (50 mg total) by mouth 2 (two) times daily. 180 tablet 3   Multiple Vitamin (MULTIVITAMIN WITH MINERALS) TABS tablet Take 1 tablet by mouth daily. (Patient taking differently: Take 1 tablet by mouth daily. Centrum)     NUCYNTA 100 MG TABS Take 2 tablets (200 mg total) by mouth daily in the afternoon. (Patient taking differently: Take 100 mg by mouth daily in the afternoon. Afternoon) 60 tablet 0   NUCYNTA ER 250 MG TB12 Take 250 mg by mouth 2 (two) times daily.     ondansetron (ZOFRAN ODT) 4 MG disintegrating tablet Take 1 tablet (4 mg total) by mouth every 8 (eight) hours as needed for nausea or vomiting. 20 tablet 0   pantoprazole (PROTONIX) 40 MG tablet Take 1 tablet (40 mg total) by mouth daily. 90 tablet 3   QUVIVIQ 25 MG TABS Take 25 tablets by mouth at bedtime.     thiamine 100 MG tablet Take 1 tablet (100 mg total) by mouth daily for 365 doses. (Patient taking differently: Take 250 mg by mouth daily.) 90 tablet 3   TRINTELLIX 20 MG TABS tablet Take 20 mg by mouth daily.     vitamin B-12 (CYANOCOBALAMIN) 100 MCG tablet Take 1 tablet (100 mcg total) by mouth daily. 90 tablet 3   VRAYLAR 6 MG CAPS Take 6 mg by mouth daily.     cholestyramine (QUESTRAN) 4 GM/DOSE powder Take 2 packets (8 g total) by mouth 2 (two) times daily with a meal. (Patient not taking: Reported on 01/10/2022) 120 packet 2   No current facility-administered medications for this visit.   Allergies  Allergen Reactions   Buprenorphine Rash    Patch gave rash- poss adhesive issues. Tried generic and brand name both. Patient states: "I am not allergic to the medication. I am allergic to the adhesive, not the medication."    Amoxicillin-Pot Clavulanate     Other reaction(s): Other (See Comments) States "strong" antibiotics cause C diff   Wound Dressing Adhesive  Rash   Sulfa Antibiotics Rash     Review of Systems: All systems reviewed and  negative except where noted in HPI.     Physical Exam:    Wt Readings from Last 3 Encounters:  05/09/22 262 lb 8 oz (119.1 kg)  01/10/22 242 lb (109.8 kg)  12/23/21 267 lb 6.4 oz (121.3 kg)    BP 120/82   Pulse 64   Ht 5\' 4"  (1.626 m)   Wt 262 lb 8 oz (119.1 kg)   BMI 45.06 kg/m  Constitutional:  Pleasant, in no acute distress. Psychiatric: Normal mood and affect. Behavior is normal. Cardiovascular: Normal rate, regular rhythm. No edema Pulmonary/chest: Effort normal and breath sounds normal. No wheezing, rales or rhonchi. Abdominal: Soft, nondistended Neurological: Alert and oriented to person place and time. Skin: Skin is warm and dry. No rashes noted.   ASSESSMENT AND PLAN;   1) Diarrhea Patient timing and response to therapy, certainly most c/w bile salt diarrhea s/p ccy.    - Cholestyramine 4 gm BID powder - Can evaluate for medical/luminal pathology at time of colonoscopy as below with random and directed biopsies to r/o IBD, Microscopic Colitis consider  2) Colon cancer screening - No previous colon cancer screening - Schedule colonoscopy for routine, age-appropriate CRC screening  3) Elevated alkaline phosphatase - Repeat LFT panel now to ensure alkaline phosphatase back to normal  4) Mild, acute normocytic anemia Developed during recent hospitalization.  Suspect multifactorial from dilutional effect of IV fluids and lab draw during inpatient course and treatment for norovirus -Repeat CBC to ensure H/H back to baseline (normal H/H during hospital admission, which slowly down trended over hospital course)   The indications, risks, and benefits of colonoscopy were explained to the patient in detail. Risks include but are not limited to bleeding, perforation, adverse reaction to medications, and cardiopulmonary compromise. Sequelae include but are not limited to the possibility of surgery, hospitalization, and mortality. The patient verbalized understanding and  wished to proceed. All questions answered, referred to the scheduler and bowel prep ordered. Further recommendations pending results of the exam.      , DO, FACG  05/09/2022, 8:35 AM   Passmore, 05/11/2022, NP

## 2022-05-17 ENCOUNTER — Other Ambulatory Visit: Payer: Self-pay | Admitting: Nurse Practitioner

## 2022-05-21 ENCOUNTER — Encounter: Payer: Self-pay | Admitting: Gastroenterology

## 2022-05-22 ENCOUNTER — Other Ambulatory Visit: Payer: Self-pay

## 2022-05-24 ENCOUNTER — Telehealth: Payer: Self-pay | Admitting: Gastroenterology

## 2022-05-28 ENCOUNTER — Encounter: Payer: BC Managed Care – PPO | Admitting: Gastroenterology

## 2022-05-31 ENCOUNTER — Telehealth: Payer: Self-pay

## 2022-05-31 NOTE — Telephone Encounter (Signed)
Prior authorization started for Cholestyramine 4GM packets waiting for insurance approval

## 2022-05-31 NOTE — Telephone Encounter (Signed)
Insurance response was unknown for Cholestyramine 4GM packets.

## 2022-07-12 ENCOUNTER — Ambulatory Visit: Payer: BC Managed Care – PPO | Admitting: Nurse Practitioner

## 2022-07-26 IMAGING — CT CT ABD-PELV W/ CM
2 of 5 series · 16 of 46 positions shown, 18 images · IV contrast (omnipaque)
Comparison: 07/30/2021, 04/19/2021

CLINICAL DATA: Nausea, vomiting, diarrhea, abdominal pain for 2
days

EXAM:
CT ABDOMEN AND PELVIS WITH CONTRAST
TECHNIQUE: Multidetector CT imaging of the abdomen and pelvis was performed
using the standard protocol following bolus administration of
intravenous contrast.
CONTRAST:  80mL OMNIPAQUE IOHEXOL 350 MG/ML SOLN

[Series 2: axial st · axial · 0.78mm/px · z∈[+1018,+1408]mm · 13 of 92 slices shown, 15 images]
[im 7/92  soft-tissue]
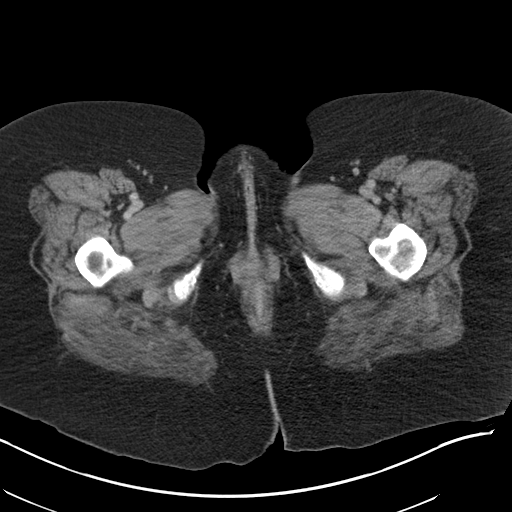
[im 7/92  bone]
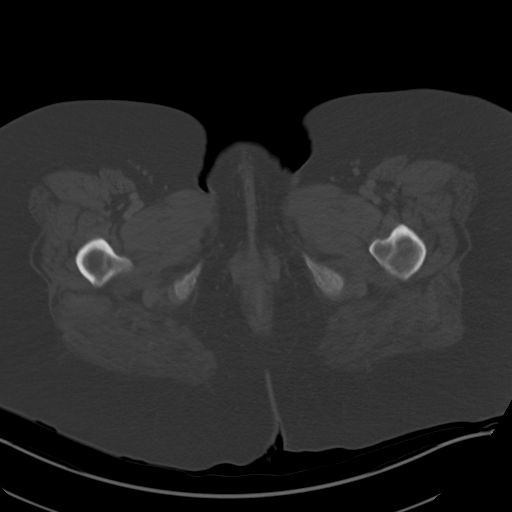
[im 14/92  soft-tissue]
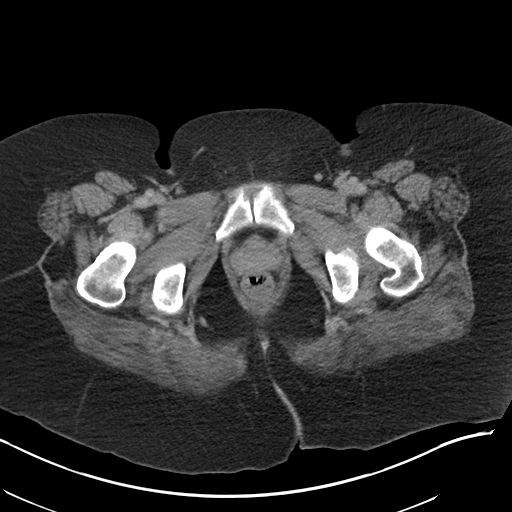
[im 20/92  soft-tissue]
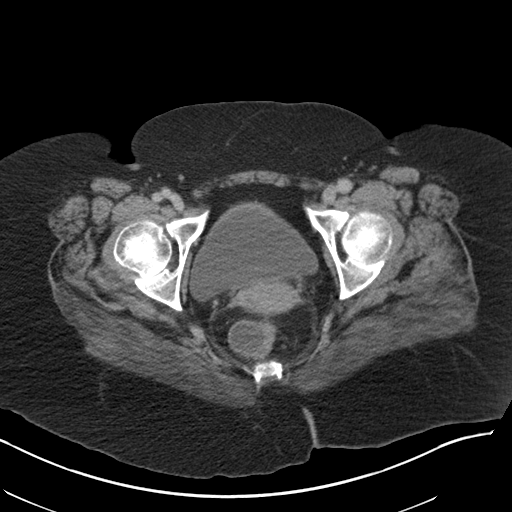
[im 27/92  soft-tissue]
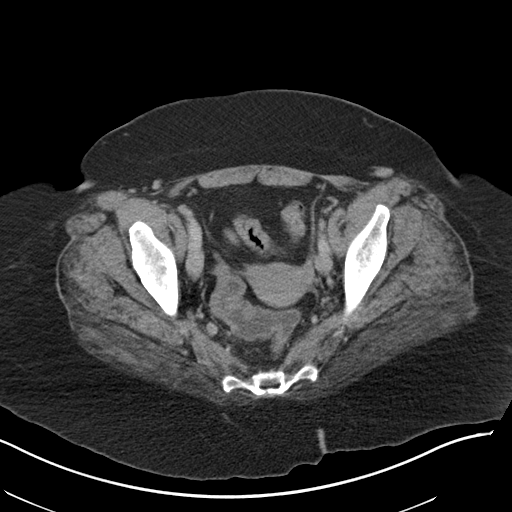
[im 33/92  soft-tissue]
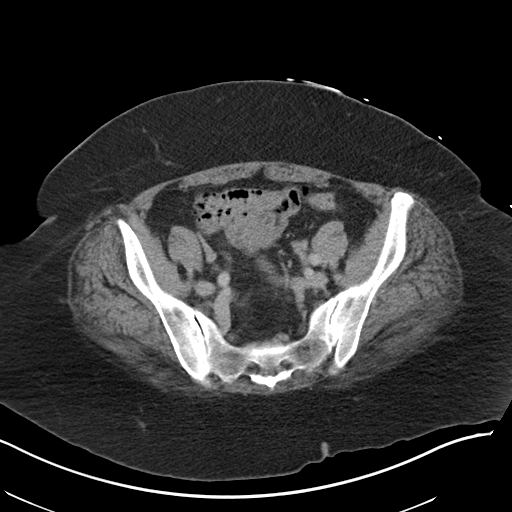
[im 40/92  soft-tissue]
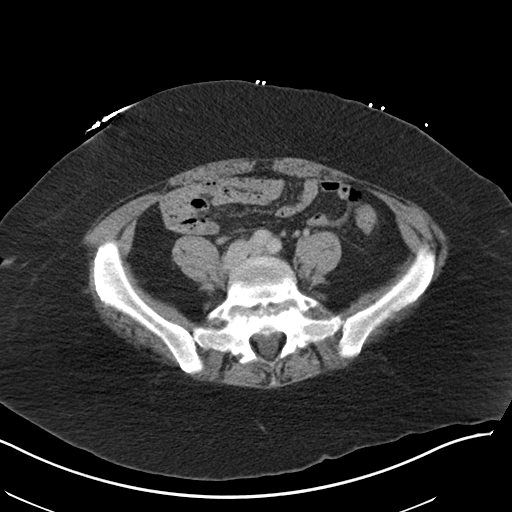
[im 46/92  soft-tissue]
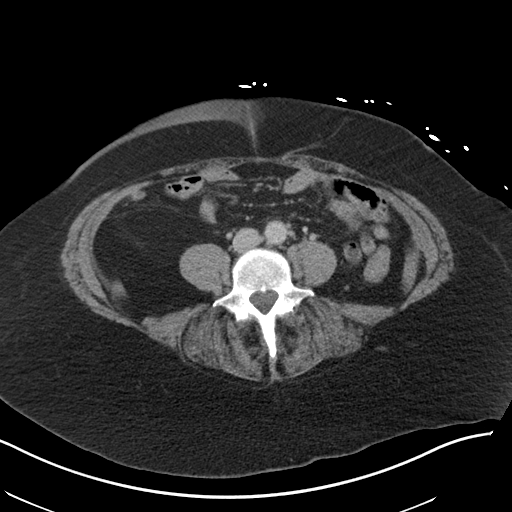
[im 53/92  soft-tissue]
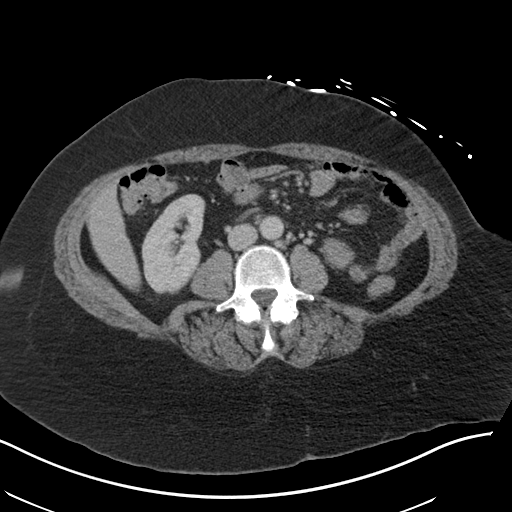
[im 59/92  soft-tissue]
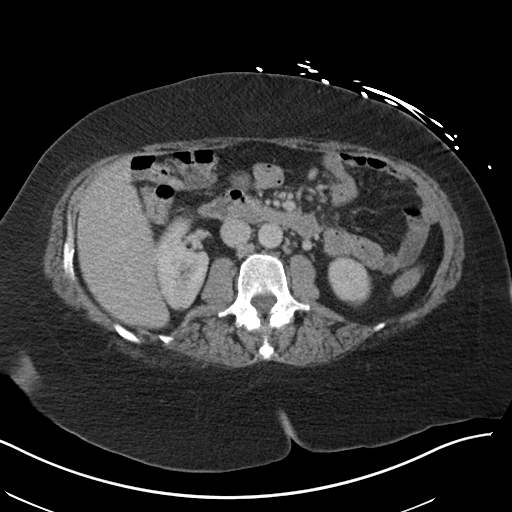
[im 59/92  bone]
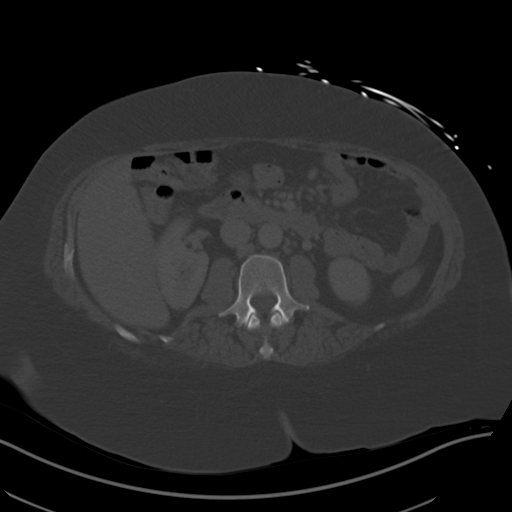
[im 66/92  soft-tissue]
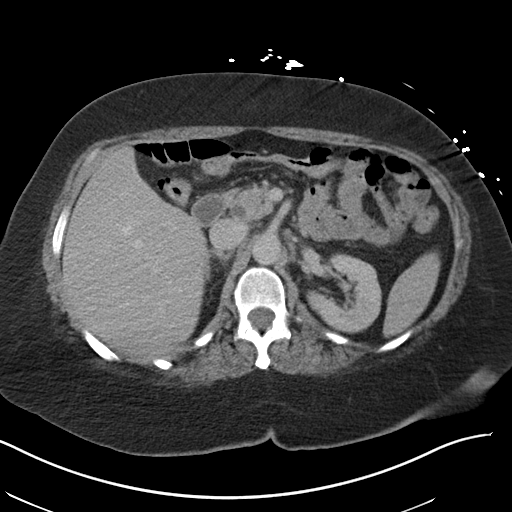
[im 72/92  soft-tissue]
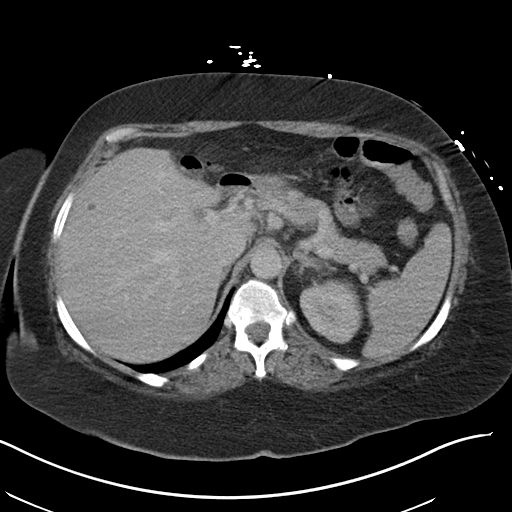
[im 79/92  soft-tissue]
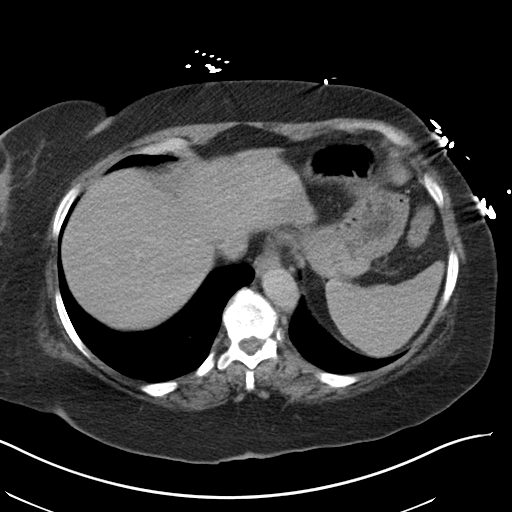
[im 85/92  soft-tissue]
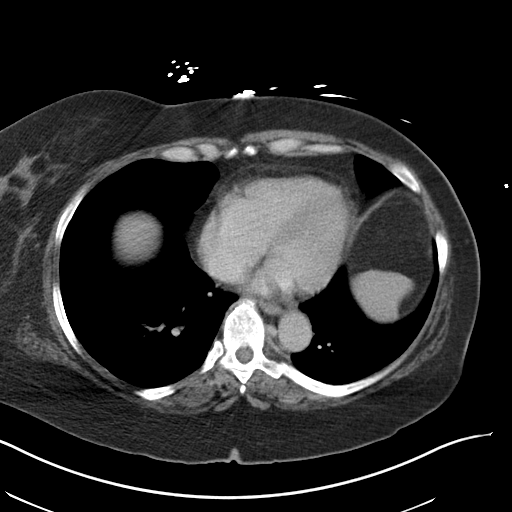

[Series 5: coronal st · coronal · 0.85mm/px · 3 of 151 slices shown]
[im 51/151  soft-tissue]
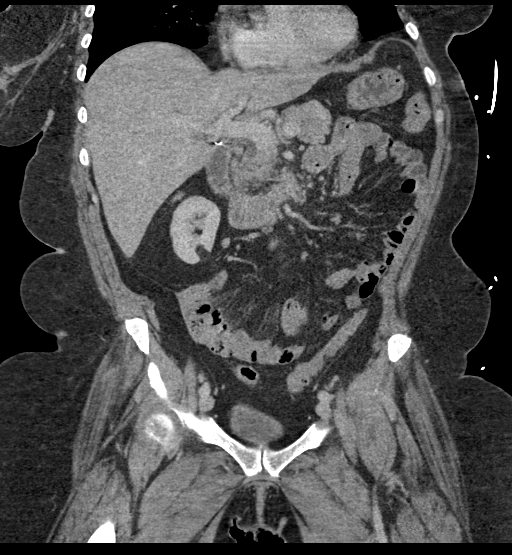
[im 67/151  soft-tissue]
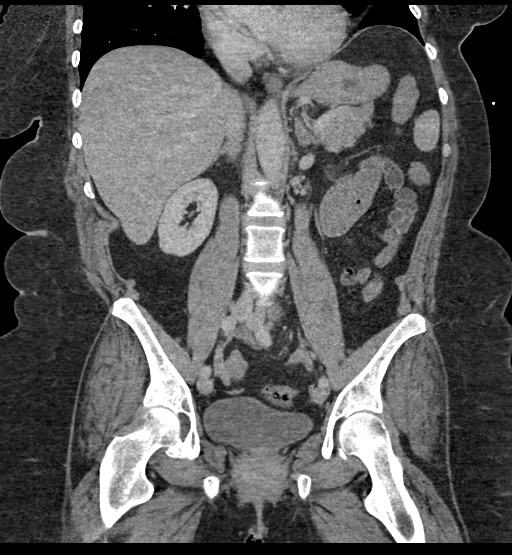
[im 84/151  soft-tissue]
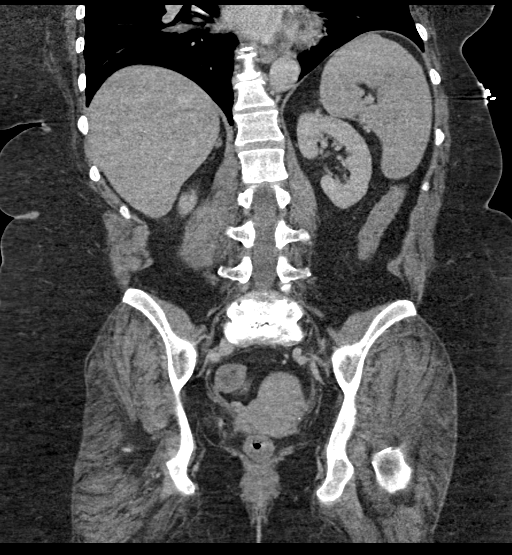

[16 of 46 positions shown; findings below may reference images not displayed]

FINDINGS: Lower chest: No acute pleural or parenchymal lung disease.

Hepatobiliary: No focal liver abnormality is seen. Status post
cholecystectomy. No biliary dilatation.

Pancreas: Unremarkable. No pancreatic ductal dilatation or
surrounding inflammatory changes.

Spleen: Normal in size without focal abnormality.

Adrenals/Urinary Tract: 9 mm right renal angiomyolipoma unchanged.
Indeterminate 7 mm hypodensity left kidney image [DATE]. No urinary
tract calculi or obstructive uropathy within either kidney. The
adrenals are unremarkable. Bladder is grossly normal.

Stomach/Bowel: No bowel obstruction or ileus. No bowel wall
thickening or inflammatory change.

Vascular/Lymphatic: Aortic atherosclerosis. No enlarged abdominal or
pelvic lymph nodes.

Reproductive: Uterus and bilateral adnexa are unremarkable.

Other: No free fluid or free gas.  No abdominal wall hernia.

Musculoskeletal: No acute or destructive bony lesions. Bilateral L5
spondylolysis, with continued grade 2 anterolisthesis of L5 on S1.
Severe spondylosis at the L5-S1 level unchanged. Reconstructed
images demonstrate no additional findings.
IMPRESSION: 1. No acute intra-abdominal or intrapelvic process.
2. Indeterminate 7 mm hypodensity left kidney, unchanged since
04/19/2021. If further evaluation is desired, dedicated nonemergent
renal MRI could be considered.
3. Stable small right renal angiomyolipoma.
4.  Aortic Atherosclerosis ([id number]-[id number].S).

## 2022-09-14 ENCOUNTER — Other Ambulatory Visit: Payer: Self-pay | Admitting: Nurse Practitioner

## 2022-09-14 DIAGNOSIS — K219 Gastro-esophageal reflux disease without esophagitis: Secondary | ICD-10-CM

## 2022-09-14 DIAGNOSIS — I10 Essential (primary) hypertension: Secondary | ICD-10-CM

## 2022-10-01 ENCOUNTER — Encounter: Payer: Self-pay | Admitting: Nurse Practitioner

## 2022-10-01 ENCOUNTER — Ambulatory Visit (INDEPENDENT_AMBULATORY_CARE_PROVIDER_SITE_OTHER): Payer: BC Managed Care – PPO | Admitting: Nurse Practitioner

## 2022-10-01 VITALS — BP 127/71 | HR 79 | Temp 97.9°F | Wt 267.0 lb

## 2022-10-01 DIAGNOSIS — Z1211 Encounter for screening for malignant neoplasm of colon: Secondary | ICD-10-CM

## 2022-10-01 DIAGNOSIS — Z1213 Encounter for screening for malignant neoplasm of small intestine: Secondary | ICD-10-CM | POA: Diagnosis not present

## 2022-10-01 DIAGNOSIS — Z0000 Encounter for general adult medical examination without abnormal findings: Secondary | ICD-10-CM | POA: Diagnosis not present

## 2022-10-01 DIAGNOSIS — Z1231 Encounter for screening mammogram for malignant neoplasm of breast: Secondary | ICD-10-CM

## 2022-10-01 NOTE — Assessment & Plan Note (Signed)
-   CBC - Comprehensive metabolic panel - Lipid Panel - Hemoglobin A1C   2. Encounter for screening mammogram for malignant neoplasm of breast  - MM Digital Screening   Follow up:  Follow up in 6 months or sooner

## 2022-10-01 NOTE — Patient Instructions (Addendum)
1. Routine health maintenance  - CBC - Comprehensive metabolic panel - Lipid Panel - Hemoglobin A1C   2. Encounter for screening mammogram for malignant neoplasm of breast  - MM Digital Screening   Follow up:  Follow up in 6 months or sooner

## 2022-10-01 NOTE — Addendum Note (Signed)
Addended by: Angus Seller on: 10/01/2022 03:20 PM   Modules accepted: Orders

## 2022-10-01 NOTE — Progress Notes (Signed)
@Patient  ID: Holly Roberts, female    DOB: 1966-06-23, 56 y.o.   MRN: [id number]  Chief Complaint  Patient presents with   Follow-up    Referring provider: Ivonne Andrew, NP   HPI  Holly Roberts presents for follow up. She  has a past medical history of Anxiety, Bipolar disorder (HCC), Chronic pain, Depression, and Hypertension.   Patient presents today for routine follow-up.  She states that overall everything has been going well.  Blood pressure was normal in office today.  Patient is due for mammogram and colonoscopy.  She declines colonoscopy at this time and we will order Cologuard. Denies f/c/s, n/v/d, hemoptysis, PND, leg swelling Denies chest pain or edema       Allergies  Allergen Reactions   Buprenorphine Rash    Patch gave rash- poss adhesive issues. Tried generic and brand name both. Patient states: "I am not allergic to the medication. I am allergic to the adhesive, not the medication."    Amoxicillin-Pot Clavulanate     Other reaction(s): Other (See Comments) States "strong" antibiotics cause C diff   Wound Dressing Adhesive     Rash   Sulfa Antibiotics Rash    Immunization History  Administered Date(s) Administered   Influenza Inj Mdck Quad Pf 10/22/2016, 09/30/2017   Influenza Split 10/06/2015   Influenza,inj,Quad PF,6+ Mos 11/09/2014, 10/07/2018, 11/04/2019, 09/21/2021   Influenza-Unspecified 1966/07/14, 11/09/2014, 10/07/2018   Moderna SARS-COV2 Booster Vaccination 08/10/2021   PFIZER Comirnaty(Gray Top)Covid-19 Tri-Sucrose Vaccine 09/10/2020, 10/01/2020   Tdap Nov 18, 1966, 10/06/2015   Zoster Recombinat (Shingrix) 11/04/2019    Past Medical History:  Diagnosis Date   Anxiety    Arthritis    Bipolar disorder (HCC)    Chronic pain    Depression    GERD (gastroesophageal reflux disease)    Hypertension    patient denies    Tobacco History: Social History   Tobacco Use  Smoking Status Never  Smokeless Tobacco Never   Counseling  given: Not Answered   Outpatient Encounter Medications as of 10/01/2022  Medication Sig   amLODipine (NORVASC) 10 MG tablet TAKE 1 TABLET BY MOUTH EVERY DAY   Armodafinil 250 MG tablet Take 250 mg by mouth every morning.   cholestyramine (QUESTRAN) 4 g packet TAKE 2 PACKETS (8 G TOTAL) BY MOUTH 2 (TWO) TIMES DAILY WITH A MEAL.   cholestyramine (QUESTRAN) 4 GM/DOSE powder Take 1 packet (4 g total) by mouth 2 (two) times daily with a meal.   cyanocobalamin (VITAMIN B12) 500 MCG tablet Take 500 mcg by mouth daily.   diclofenac (VOLTAREN) 50 MG EC tablet Take 50 mg by mouth daily.   folic acid (FOLVITE) 1 MG tablet TAKE 1 TABLET BY MOUTH EVERY DAY   HORIZANT 600 MG TBCR Take 600-1,200 mg by mouth in the morning and at bedtime. Take 1 tablet (600 mg) in the morning and Take 2 tablets (1200 mg) at bedtime   metoprolol tartrate (LOPRESSOR) 50 MG tablet TAKE 1 TABLET BY MOUTH TWICE A DAY   Multiple Vitamin (MULTIVITAMIN WITH MINERALS) TABS tablet Take 1 tablet by mouth daily. (Patient taking differently: Take 1 tablet by mouth daily. Centrum)   NUCYNTA 100 MG TABS Take 2 tablets (200 mg total) by mouth daily in the afternoon. (Patient taking differently: Take 100 mg by mouth daily in the afternoon. Afternoon)   NUCYNTA ER 250 MG TB12 Take 250 mg by mouth 2 (two) times daily.   ondansetron (ZOFRAN ODT) 4 MG disintegrating tablet Take 1 tablet (4  mg total) by mouth every 8 (eight) hours as needed for nausea or vomiting.   pantoprazole (PROTONIX) 40 MG tablet TAKE 1 TABLET BY MOUTH EVERY DAY   QUVIVIQ 25 MG TABS Take 25 tablets by mouth at bedtime.   TRINTELLIX 20 MG TABS tablet Take 20 mg by mouth daily.   VRAYLAR 6 MG CAPS Take 6 mg by mouth daily.   [DISCONTINUED] loperamide (IMODIUM) 2 MG capsule Take 1 capsule (2 mg total) by mouth every 6 (six) hours as needed for diarrhea or loose stools.   [DISCONTINUED] doxepin (SINEQUAN) 100 MG capsule Take 1 capsule (100 mg total) by mouth at bedtime.    [DISCONTINUED] Sod Picosulfate-Mag Ox-Cit Acd (CLENPIQ) 10-3.5-12 MG-GM -GM/160ML SOLN Take 1 kit by mouth as directed.   No facility-administered encounter medications on file as of 10/01/2022.     Review of Systems  Review of Systems  Constitutional: Negative.   HENT: Negative.    Cardiovascular: Negative.   Gastrointestinal: Negative.   Allergic/Immunologic: Negative.   Neurological: Negative.   Psychiatric/Behavioral: Negative.         Physical Exam  BP 127/71   Pulse 79   Temp 97.9 F (36.6 C) (Oral)   Wt 267 lb (121.1 kg)   SpO2 98%   BMI 45.83 kg/m   Wt Readings from Last 5 Encounters:  10/01/22 267 lb (121.1 kg)  05/09/22 262 lb 8 oz (119.1 kg)  01/10/22 242 lb (109.8 kg)  12/23/21 267 lb 6.4 oz (121.3 kg)  09/21/21 246 lb 9.6 oz (111.9 kg)     Physical Exam Vitals and nursing note reviewed.  Constitutional:      General: She is not in acute distress.    Appearance: She is well-developed.  Cardiovascular:     Rate and Rhythm: Normal rate and regular rhythm.  Pulmonary:     Effort: Pulmonary effort is normal.     Breath sounds: Normal breath sounds.  Neurological:     Mental Status: She is alert and oriented to person, place, and time.       Assessment & Plan:   Routine health maintenance - CBC - Comprehensive metabolic panel - Lipid Panel - Hemoglobin A1C   2. Encounter for screening mammogram for malignant neoplasm of breast  - MM Digital Screening   Follow up:  Follow up in 6 months or sooner     Ivonne Andrew, NP 10/01/2022

## 2022-10-02 LAB — COMPREHENSIVE METABOLIC PANEL
ALT: 21 IU/L (ref 0–32)
AST: 17 IU/L (ref 0–40)
Albumin/Globulin Ratio: 2 (ref 1.2–2.2)
Albumin: 4.7 g/dL (ref 3.8–4.9)
Alkaline Phosphatase: 134 IU/L — ABNORMAL HIGH (ref 44–121)
BUN/Creatinine Ratio: 16 (ref 9–23)
BUN: 15 mg/dL (ref 6–24)
Bilirubin Total: 0.2 mg/dL (ref 0.0–1.2)
CO2: 25 mmol/L (ref 20–29)
Calcium: 9.6 mg/dL (ref 8.7–10.2)
Chloride: 103 mmol/L (ref 96–106)
Creatinine, Ser: 0.92 mg/dL (ref 0.57–1.00)
Globulin, Total: 2.4 g/dL (ref 1.5–4.5)
Glucose: 102 mg/dL — ABNORMAL HIGH (ref 70–99)
Potassium: 4.2 mmol/L (ref 3.5–5.2)
Sodium: 142 mmol/L (ref 134–144)
Total Protein: 7.1 g/dL (ref 6.0–8.5)
eGFR: 73 mL/min/{1.73_m2} (ref >59)

## 2022-10-02 LAB — CBC
Hematocrit: 39.9 % (ref 34.0–46.6)
Hemoglobin: 13.2 g/dL (ref 11.1–15.9)
MCH: 29 pg (ref 26.6–33.0)
MCHC: 33.1 g/dL (ref 31.5–35.7)
MCV: 88 fL (ref 79–97)
Platelets: 253 10*3/uL (ref 150–450)
RBC: 4.55 x10E6/uL (ref 3.77–5.28)
RDW: 12.4 % (ref 11.7–15.4)
WBC: 6.9 10*3/uL (ref 3.4–10.8)

## 2022-10-02 LAB — LIPID PANEL
Chol/HDL Ratio: 3.7 ratio (ref 0.0–4.4)
Cholesterol, Total: 247 mg/dL — ABNORMAL HIGH (ref 100–199)
HDL: 66 mg/dL (ref >39)
LDL Chol Calc (NIH): 146 mg/dL — ABNORMAL HIGH (ref 0–99)
Triglycerides: 195 mg/dL — ABNORMAL HIGH (ref 0–149)
VLDL Cholesterol Cal: 35 mg/dL (ref 5–40)

## 2022-10-02 LAB — HEMOGLOBIN A1C
Est. average glucose Bld gHb Est-mCnc: 97 mg/dL
Hgb A1c MFr Bld: 5 % (ref 4.8–5.6)

## 2022-12-13 ENCOUNTER — Other Ambulatory Visit: Payer: Self-pay | Admitting: Nurse Practitioner

## 2022-12-14 NOTE — Telephone Encounter (Signed)
[facility] is requesting to fill pt hydroxyzine . Please advise [facility]

## 2023-02-04 ENCOUNTER — Other Ambulatory Visit: Payer: Self-pay | Admitting: Gastroenterology

## 2023-02-28 ENCOUNTER — Ambulatory Visit: Payer: Self-pay | Admitting: Nurse Practitioner

## 2023-03-13 ENCOUNTER — Encounter: Payer: Self-pay | Admitting: Nurse Practitioner

## 2023-03-13 ENCOUNTER — Ambulatory Visit (INDEPENDENT_AMBULATORY_CARE_PROVIDER_SITE_OTHER): Payer: BC Managed Care – PPO | Admitting: Nurse Practitioner

## 2023-03-13 VITALS — BP 130/66 | HR 86 | Temp 97.3°F | Ht 64.0 in | Wt 255.4 lb

## 2023-03-13 DIAGNOSIS — Z13220 Encounter for screening for lipoid disorders: Secondary | ICD-10-CM | POA: Diagnosis not present

## 2023-03-13 DIAGNOSIS — I10 Essential (primary) hypertension: Secondary | ICD-10-CM | POA: Diagnosis not present

## 2023-03-13 MED ORDER — BUSPIRONE HCL 10 MG PO TABS: 10.0000 mg | ORAL_TABLET | Freq: Two times a day (BID) | ORAL | 0 refills | Status: DC

## 2023-03-13 NOTE — Assessment & Plan Note (Signed)
-   Lipid Panel  2. Benign essential HTN  - CBC - Comprehensive metabolic panel  3. Anxiety  Will trial buspar     Follow up:  Follow up in 6 months

## 2023-03-13 NOTE — Patient Instructions (Addendum)
1. Lipid screening  - Lipid Panel  2. Benign essential HTN  - CBC - Comprehensive metabolic panel  3. Anxiety  Will trial buspar     Follow up:  Follow up in 6 months

## 2023-03-13 NOTE — Progress Notes (Signed)
@Patient  ID: Holly Roberts, female    DOB: 03-30-1966, 57 y.o.   MRN: [id number]  Chief Complaint  Patient presents with   Follow-up    Med and health    Referring provider: Ivonne Andrew, NP   HPI  Holly Roberts presents for follow up. She  has a past medical history of Anxiety, Bipolar disorder (HCC), Chronic pain, Depression, and Hypertension.      Patient presents today for routine follow-up.  She states that overall everything has been going well.  Blood pressure was normal in office today.  Patient does have majority of her medications refilled through pain management with advanced care in [facility].  She states that she has been having increased anxiety recently.  She states that the hydroxyzine is helping but she is having to take it 3 times a day and feels like she needs more.  We discussed that we can trial BuSpar.  Denies f/c/s, n/v/d, hemoptysis, PND, leg swelling. Denies chest pain or edema.      Allergies  Allergen Reactions   Buprenorphine Rash    Patch gave rash- poss adhesive issues. Tried generic and brand name both. Patient states: "I am not allergic to the medication. I am allergic to the adhesive, not the medication."    Amoxicillin-Pot Clavulanate     Other reaction(s): Other (See Comments) States "strong" antibiotics cause C diff   Wound Dressing Adhesive     Rash   Sulfa Antibiotics Rash    Immunization History  Administered Date(s) Administered   Influenza Inj Mdck Quad Pf 10/22/2016, 09/30/2017   Influenza Split 10/06/2015   Influenza,inj,Quad PF,6+ Mos 11/09/2014, 10/07/2018, 11/04/2019, 09/21/2021   Influenza-Unspecified [birth year], 11/09/2014, 10/07/2018   Moderna SARS-COV2 Booster Vaccination 08/10/2021   PFIZER Comirnaty(Gray Top)Covid-19 Tri-Sucrose Vaccine 09/10/2020, 10/01/2020   Tdap March 17, 1966, 10/06/2015   Zoster Recombinat (Shingrix) 11/04/2019    Past Medical History:  Diagnosis Date   Anxiety    Arthritis    Bipolar  disorder (HCC)    Chronic pain    Depression    GERD (gastroesophageal reflux disease)    Hypertension    patient denies    Tobacco History: Social History   Tobacco Use  Smoking Status Never  Smokeless Tobacco Never   Counseling given: Not Answered   Outpatient Encounter Medications as of 03/13/2023  Medication Sig   amLODipine (NORVASC) 10 MG tablet TAKE 1 TABLET BY MOUTH EVERY DAY   Armodafinil 250 MG tablet Take 250 mg by mouth every morning.   busPIRone (BUSPAR) 10 MG tablet Take 1 tablet (10 mg total) by mouth 2 (two) times daily.   cholestyramine (QUESTRAN) 4 g packet TAKE 2 PACKETS (8 G TOTAL) BY MOUTH 2 (TWO) TIMES DAILY WITH A MEAL.   cholestyramine (QUESTRAN) 4 GM/DOSE powder Take 1 packet (4 g total) by mouth 2 (two) times daily with a meal.   cyanocobalamin (VITAMIN B12) 500 MCG tablet Take 500 mcg by mouth daily.   diclofenac (VOLTAREN) 50 MG EC tablet Take 50 mg by mouth daily.   folic acid (FOLVITE) 1 MG tablet TAKE 1 TABLET BY MOUTH EVERY DAY   HORIZANT 600 MG TBCR Take 600-1,200 mg by mouth in the morning and at bedtime. Take 1 tablet (600 mg) in the morning and Take 2 tablets (1200 mg) at bedtime   hydrOXYzine (ATARAX) 10 MG tablet TAKE 1 TABLET BY MOUTH THREE TIMES A DAY AS NEEDED   metoprolol tartrate (LOPRESSOR) 50 MG tablet TAKE 1 TABLET BY MOUTH  TWICE A DAY   Multiple Vitamin (MULTIVITAMIN WITH MINERALS) TABS tablet Take 1 tablet by mouth daily. (Patient taking differently: Take 1 tablet by mouth daily. Centrum)   NUCYNTA 100 MG TABS Take 2 tablets (200 mg total) by mouth daily in the afternoon. (Patient taking differently: Take 100 mg by mouth daily in the afternoon. Afternoon)   NUCYNTA ER 250 MG TB12 Take 250 mg by mouth 2 (two) times daily.   ondansetron (ZOFRAN ODT) 4 MG disintegrating tablet Take 1 tablet (4 mg total) by mouth every 8 (eight) hours as needed for nausea or vomiting.   pantoprazole (PROTONIX) 40 MG tablet TAKE 1 TABLET BY MOUTH EVERY DAY    QUVIVIQ 25 MG TABS Take 25 tablets by mouth at bedtime.   TRINTELLIX 20 MG TABS tablet Take 20 mg by mouth daily.   vitamin B-12 (CYANOCOBALAMIN) 100 MCG tablet TAKE 1 TABLET BY MOUTH EVERY DAY   VRAYLAR 6 MG CAPS Take 6 mg by mouth daily.   No facility-administered encounter medications on file as of 03/13/2023.     Review of Systems  Review of Systems  Constitutional: Negative.   HENT: Negative.    Cardiovascular: Negative.   Gastrointestinal: Negative.   Allergic/Immunologic: Negative.   Neurological: Negative.   Psychiatric/Behavioral:  The patient is nervous/anxious.        Physical Exam  BP 130/66   Pulse 86   Temp (!) 97.3 F (36.3 C)   Ht 5\' 4"  (1.626 m)   Wt 255 lb 6.4 oz (115.8 kg)   SpO2 95%   BMI 43.84 kg/m   Wt Readings from Last 5 Encounters:  03/13/23 255 lb 6.4 oz (115.8 kg)  10/01/22 267 lb (121.1 kg)  05/09/22 262 lb 8 oz (119.1 kg)  01/10/22 242 lb (109.8 kg)  12/23/21 267 lb 6.4 oz (121.3 kg)     Physical Exam Vitals and nursing note reviewed.  Constitutional:      General: She is not in acute distress.    Appearance: She is well-developed.  Cardiovascular:     Rate and Rhythm: Normal rate and regular rhythm.  Pulmonary:     Effort: Pulmonary effort is normal.     Breath sounds: Normal breath sounds.  Neurological:     Mental Status: She is alert and oriented to person, place, and time.       Assessment & Plan:   Lipid screening - Lipid Panel  2. Benign essential HTN  - CBC - Comprehensive metabolic panel  3. Anxiety  Will trial buspar     Follow up:  Follow up in 6 months     Ivonne Andrew, NP 03/13/2023

## 2023-03-14 ENCOUNTER — Other Ambulatory Visit: Payer: Self-pay | Admitting: Nurse Practitioner

## 2023-03-14 LAB — COMPREHENSIVE METABOLIC PANEL
ALT: 49 IU/L — ABNORMAL HIGH (ref 0–32)
AST: 24 IU/L (ref 0–40)
Albumin/Globulin Ratio: 1.8 (ref 1.2–2.2)
Albumin: 4.6 g/dL (ref 3.8–4.9)
Alkaline Phosphatase: 128 IU/L — ABNORMAL HIGH (ref 44–121)
BUN/Creatinine Ratio: 18 (ref 9–23)
BUN: 15 mg/dL (ref 6–24)
Bilirubin Total: 0.4 mg/dL (ref 0.0–1.2)
CO2: 22 mmol/L (ref 20–29)
Calcium: 9.8 mg/dL (ref 8.7–10.2)
Chloride: 105 mmol/L (ref 96–106)
Creatinine, Ser: 0.83 mg/dL (ref 0.57–1.00)
Globulin, Total: 2.6 g/dL (ref 1.5–4.5)
Glucose: 110 mg/dL — ABNORMAL HIGH (ref 70–99)
Potassium: 4.2 mmol/L (ref 3.5–5.2)
Sodium: 143 mmol/L (ref 134–144)
Total Protein: 7.2 g/dL (ref 6.0–8.5)
eGFR: 82 mL/min/{1.73_m2} (ref >59)

## 2023-03-14 LAB — CBC
Hematocrit: 44.4 % (ref 34.0–46.6)
Hemoglobin: 14.4 g/dL (ref 11.1–15.9)
MCH: 27.9 pg (ref 26.6–33.0)
MCHC: 32.4 g/dL (ref 31.5–35.7)
MCV: 86 fL (ref 79–97)
Platelets: 309 10*3/uL (ref 150–450)
RBC: 5.16 x10E6/uL (ref 3.77–5.28)
RDW: 12.7 % (ref 11.7–15.4)
WBC: 7.8 10*3/uL (ref 3.4–10.8)

## 2023-03-14 LAB — LIPID PANEL
Chol/HDL Ratio: 2.8 ratio (ref 0.0–4.4)
Cholesterol, Total: 213 mg/dL — ABNORMAL HIGH (ref 100–199)
HDL: 77 mg/dL (ref >39)
LDL Chol Calc (NIH): 118 mg/dL — ABNORMAL HIGH (ref 0–99)
Triglycerides: 104 mg/dL (ref 0–149)
VLDL Cholesterol Cal: 18 mg/dL (ref 5–40)

## 2023-03-14 MED ORDER — NYSTATIN 100000 UNIT/GM EX CREA: 1.0000 | TOPICAL_CREAM | Freq: Two times a day (BID) | CUTANEOUS | 0 refills | Status: DC

## 2023-03-14 NOTE — Progress Notes (Signed)
Called Pt and inform results.[place]

## 2023-04-01 ENCOUNTER — Ambulatory Visit: Payer: Self-pay | Admitting: Nurse Practitioner

## 2023-04-04 ENCOUNTER — Other Ambulatory Visit: Payer: Self-pay | Admitting: Nurse Practitioner

## 2023-04-04 NOTE — Telephone Encounter (Signed)
Please advise. kh 

## 2023-04-27 ENCOUNTER — Other Ambulatory Visit: Payer: Self-pay | Admitting: Nurse Practitioner

## 2023-04-29 NOTE — Telephone Encounter (Signed)
Please advise KH 

## 2023-06-14 ENCOUNTER — Other Ambulatory Visit: Payer: Self-pay

## 2023-06-14 ENCOUNTER — Emergency Department (HOSPITAL_COMMUNITY)
Admission: EM | Admit: 2023-06-14 | Discharge: 2023-06-15 | Disposition: A | Discharge status: Home / Self Care | Payer: BC Managed Care – PPO | Source: Home / Self Care | Attending: Emergency Medicine | Admitting: Emergency Medicine

## 2023-06-14 ENCOUNTER — Emergency Department (HOSPITAL_COMMUNITY): Payer: BC Managed Care – PPO

## 2023-06-14 ENCOUNTER — Encounter (HOSPITAL_COMMUNITY): Payer: Self-pay

## 2023-06-14 DIAGNOSIS — Z79899 Other long term (current) drug therapy: Secondary | ICD-10-CM | POA: Insufficient documentation

## 2023-06-14 DIAGNOSIS — R1084 Generalized abdominal pain: Secondary | ICD-10-CM | POA: Insufficient documentation

## 2023-06-14 DIAGNOSIS — A044 Other intestinal Escherichia coli infections: Secondary | ICD-10-CM | POA: Diagnosis not present

## 2023-06-14 DIAGNOSIS — I10 Essential (primary) hypertension: Secondary | ICD-10-CM | POA: Insufficient documentation

## 2023-06-14 DIAGNOSIS — R197 Diarrhea, unspecified: Secondary | ICD-10-CM | POA: Insufficient documentation

## 2023-06-14 DIAGNOSIS — R112 Nausea with vomiting, unspecified: Secondary | ICD-10-CM | POA: Insufficient documentation

## 2023-06-14 LAB — BASIC METABOLIC PANEL
Anion gap: 13 (ref 5–15)
BUN: 13 mg/dL (ref 6–20)
CO2: 23 mmol/L (ref 22–32)
Calcium: 9.4 mg/dL (ref 8.9–10.3)
Chloride: 102 mmol/L (ref 98–111)
Creatinine, Ser: 0.79 mg/dL (ref 0.44–1.00)
GFR, Estimated: >60 mL/min (ref >60)
Glucose, Bld: 108 mg/dL — ABNORMAL HIGH (ref 70–99)
Potassium: 3.5 mmol/L (ref 3.5–5.1)
Sodium: 138 mmol/L (ref 135–145)

## 2023-06-14 LAB — CBC
HCT: 44.2 % (ref 36.0–46.0)
Hemoglobin: 14.4 g/dL (ref 12.0–15.0)
MCH: 28.1 pg (ref 26.0–34.0)
MCHC: 32.6 g/dL (ref 30.0–36.0)
MCV: 86.3 fL (ref 80.0–100.0)
Platelets: 261 10*3/uL (ref 150–400)
RBC: 5.12 MIL/uL — ABNORMAL HIGH (ref 3.87–5.11)
RDW: 12.3 % (ref 11.5–15.5)
WBC: 12.8 10*3/uL — ABNORMAL HIGH (ref 4.0–10.5)
nRBC: 0 % (ref 0.0–0.2)

## 2023-06-14 LAB — HEPATIC FUNCTION PANEL
ALT: 15 U/L (ref 0–44)
AST: 19 U/L (ref 15–41)
Albumin: 4.8 g/dL (ref 3.5–5.0)
Alkaline Phosphatase: 110 U/L (ref 38–126)
Bilirubin, Direct: <0.1 mg/dL (ref 0.0–0.2)
Total Bilirubin: 0.6 mg/dL (ref 0.3–1.2)
Total Protein: 8.1 g/dL (ref 6.5–8.1)

## 2023-06-14 LAB — HCG, SERUM, QUALITATIVE: Preg, Serum: NEGATIVE

## 2023-06-14 LAB — CBG MONITORING, ED: Glucose-Capillary: 108 mg/dL — ABNORMAL HIGH (ref 70–99)

## 2023-06-14 LAB — LIPASE, BLOOD: Lipase: 89 U/L — ABNORMAL HIGH (ref 11–51)

## 2023-06-14 MED ORDER — IOHEXOL 300 MG/ML  SOLN
100.0000 mL | Freq: Once | INTRAMUSCULAR | Status: AC | PRN
  Administered 2023-06-14: 100 mL via INTRAVENOUS

## 2023-06-14 MED ORDER — KETOROLAC TROMETHAMINE 15 MG/ML IJ SOLN
15.0000 mg | Freq: Once | INTRAMUSCULAR | Status: AC
  Administered 2023-06-14: 15 mg via INTRAVENOUS
  Filled 2023-06-14: qty 1

## 2023-06-14 MED ORDER — ALUM & MAG HYDROXIDE-SIMETH 200-200-20 MG/5ML PO SUSP
30.0000 mL | Freq: Once | ORAL | Status: AC
  Administered 2023-06-15: 30 mL via ORAL
  Filled 2023-06-14: qty 30

## 2023-06-14 MED ORDER — METOCLOPRAMIDE HCL 5 MG/ML IJ SOLN
10.0000 mg | Freq: Once | INTRAMUSCULAR | Status: AC
  Administered 2023-06-14: 10 mg via INTRAVENOUS
  Filled 2023-06-14: qty 2

## 2023-06-14 MED ORDER — LACTATED RINGERS IV BOLUS
2000.0000 mL | Freq: Once | INTRAVENOUS | Status: AC
  Administered 2023-06-14: 2000 mL via INTRAVENOUS

## 2023-06-14 MED ORDER — HYDROMORPHONE HCL 1 MG/ML IJ SOLN
1.0000 mg | Freq: Once | INTRAMUSCULAR | Status: AC
  Administered 2023-06-14: 1 mg via INTRAVENOUS
  Filled 2023-06-14: qty 1

## 2023-06-14 MED ORDER — HYOSCYAMINE SULFATE 0.125 MG SL SUBL
0.2500 mg | SUBLINGUAL_TABLET | Freq: Once | SUBLINGUAL | Status: AC
  Administered 2023-06-15: 0.25 mg via SUBLINGUAL
  Filled 2023-06-14: qty 2

## 2023-06-14 MED ORDER — OXYCODONE HCL 5 MG PO TABS
15.0000 mg | ORAL_TABLET | Freq: Once | ORAL | Status: AC
  Administered 2023-06-14: 15 mg via ORAL
  Filled 2023-06-14: qty 3

## 2023-06-14 NOTE — ED Notes (Signed)
US PIV placed. 

## 2023-06-14 NOTE — ED Triage Notes (Signed)
Pt arrived with ambulance. Reports abdominal pain, nausea, vomiting and diarrhea since last night. Denies any other symptoms.

## 2023-06-14 NOTE — ED Notes (Signed)
Attempted to obtain UA sample. Pt sts "can't pee because I've been having diarrhea". Will re-attempt later.

## 2023-06-14 NOTE — ED Provider Notes (Signed)
[place] EMERGENCY DEPARTMENT AT [facility] Provider Note   CSN: [id number] Arrival date & time: 06/14/23  1611     History  Chief Complaint  Patient presents with   Abdominal Pain   Nausea   Vomiting    Holly Roberts is a 57 y.o. female.  Patient is a 57 year old female with a history of hypertension, chronic pain on Nucynta, bipolar disease and prior cholecystectomy who is presenting today with a 24-hour history of nausea vomiting and diarrhea.  Patient reports symptoms started yesterday throughout the day and she has had more than 20 episodes of vomiting and even more diarrhea.  She reports the diarrhea is watery but now her rectum is so irritated and sore she is starting to notice a little bit of blood when she wipes.  She does not think she has had any food poisoning and denies any sick contacts.  She has not been able to take any Nucynta today because of the nausea vomiting.  She has diffuse abdominal tenderness but no focal areas of pain.  She denies any fever.  No blood in her emesis.  She does report this has happened to her in the past that would require admission due to intractable nausea and vomiting but it has been quite sometime since she has had an issue.  The history is provided by the patient and medical records.  Abdominal Pain      Home Medications Prior to Admission medications   Medication Sig Start Date End Date Taking? Authorizing Provider  amLODipine (NORVASC) 10 MG tablet TAKE 1 TABLET BY MOUTH EVERY DAY 09/14/22   Ivonne Andrew, NP  Armodafinil 250 MG tablet Take 250 mg by mouth every morning. 04/09/21   [provider]  busPIRone (BUSPAR) 10 MG tablet TAKE 1 TABLET(10 MG) BY MOUTH TWICE DAILY 04/29/23   Ivonne Andrew, NP  cholestyramine (QUESTRAN) 4 g packet TAKE 2 PACKETS (8 G TOTAL) BY MOUTH 2 (TWO) TIMES DAILY WITH A MEAL. 05/29/22   Ivonne Andrew, NP  cholestyramine Lanetta Inch) 4 GM/DOSE powder Take 1 packet (4 g total) by  mouth 2 (two) times daily with a meal. 05/09/22   Cirigliano, Vito V, DO  cyanocobalamin (VITAMIN B12) 500 MCG tablet Take 500 mcg by mouth daily.    [provider]  diclofenac (VOLTAREN) 50 MG EC tablet Take 50 mg by mouth daily. 10/08/21   [provider]  folic acid (FOLVITE) 1 MG tablet TAKE 1 TABLET BY MOUTH EVERY DAY 09/14/22   Ivonne Andrew, NP  HORIZANT 600 MG TBCR Take 600-1,200 mg by mouth in the morning and at bedtime. Take 1 tablet (600 mg) in the morning and Take 2 tablets (1200 mg) at bedtime 05/29/21   [provider]  hydrOXYzine (ATARAX) 10 MG tablet TAKE 1 TABLET BY MOUTH THREE TIMES A DAY AS NEEDED 12/14/22   Ivonne Andrew, NP  metoprolol tartrate (LOPRESSOR) 50 MG tablet TAKE 1 TABLET BY MOUTH TWICE A DAY 09/14/22   Ivonne Andrew, NP  Multiple Vitamin (MULTIVITAMIN WITH MINERALS) TABS tablet Take 1 tablet by mouth daily. Patient taking differently: Take 1 tablet by mouth daily. Centrum 08/11/21   Rai, Ripudeep K, MD  NUCYNTA 100 MG TABS Take 2 tablets (200 mg total) by mouth daily in the afternoon. Patient taking differently: Take 100 mg by mouth daily in the afternoon. Afternoon 08/11/21   Medina-Vargas, Monina C, NP  NUCYNTA ER 250 MG TB12 Take 250 mg by  mouth 2 (two) times daily. 12/07/21   [provider]  nystatin cream (MYCOSTATIN) Apply 1 Application topically 2 (two) times daily. 03/14/23   Ivonne Andrew, NP  ondansetron (ZOFRAN ODT) 4 MG disintegrating tablet Take 1 tablet (4 mg total) by mouth every 8 (eight) hours as needed for nausea or vomiting. 09/21/21   Barbette Merino, NP  pantoprazole (PROTONIX) 40 MG tablet TAKE 1 TABLET BY MOUTH EVERY DAY 09/14/22   Ivonne Andrew, NP  QUVIVIQ 25 MG TABS Take 25 tablets by mouth at bedtime. 12/08/21   [provider]  TRINTELLIX 20 MG TABS tablet Take 20 mg by mouth daily. 06/08/21   [provider]  vitamin B-12 (CYANOCOBALAMIN) 100 MCG tablet TAKE 1 TABLET BY MOUTH  EVERY DAY 12/14/22   Ivonne Andrew, NP  VRAYLAR 6 MG CAPS Take 6 mg by mouth daily. 07/11/21   [provider]      Allergies    Buprenorphine, Amoxicillin-pot clavulanate, Wound dressing adhesive, and Sulfa antibiotics    Review of Systems   Review of Systems  Gastrointestinal:  Positive for abdominal pain.    Physical Exam Updated Vital Signs BP (!) 152/93 (BP Location: Right Arm)   Pulse 77   Temp 99 F (37.2 C) (Oral)   Resp 20   Ht 5\' 4"  (1.626 m)   Wt 115 kg   SpO2 94%   BMI 43.52 kg/m  Physical Exam Vitals and nursing note reviewed.  Constitutional:      General: She is not in acute distress.    Appearance: She is well-developed.  HENT:     Head: Normocephalic and atraumatic.     Mouth/Throat:     Mouth: Mucous membranes are dry.  Eyes:     Pupils: Pupils are equal, round, and reactive to light.  Cardiovascular:     Rate and Rhythm: Normal rate and regular rhythm.     Heart sounds: Normal heart sounds. No murmur heard.    No friction rub.  Pulmonary:     Effort: Pulmonary effort is normal.     Breath sounds: Normal breath sounds. No wheezing or rales.  Abdominal:     General: Bowel sounds are normal. There is no distension.     Palpations: Abdomen is soft.     Tenderness: There is generalized abdominal tenderness. There is no guarding or rebound.     Comments: Mild diffuse tenderness  Musculoskeletal:        General: No tenderness. Normal range of motion.     Right lower leg: No edema.     Left lower leg: No edema.     Comments: No edema  Skin:    General: Skin is warm and dry.     Findings: No rash.  Neurological:     Mental Status: She is alert and oriented to person, place, and time. Mental status is at baseline.     Cranial Nerves: No cranial nerve deficit.  Psychiatric:        Behavior: Behavior normal.     ED Results / Procedures / Treatments   Labs (all labs ordered are listed, but only abnormal results are displayed) Labs  Reviewed  BASIC METABOLIC PANEL - Abnormal; Notable for the following components:      Result Value   Glucose, Bld 108 (*)    All other components within normal limits  CBC - Abnormal; Notable for the following components:   WBC 12.8 (*)    RBC 5.12 (*)  All other components within normal limits  CBG MONITORING, ED - Abnormal; Notable for the following components:   Glucose-Capillary 108 (*)    All other components within normal limits  HCG, SERUM, QUALITATIVE  URINALYSIS, ROUTINE W REFLEX MICROSCOPIC    EKG EKG Interpretation Date/Time:  Friday June 14 2023 16:54:05 EDT Ventricular Rate:  72 PR Interval:  164 QRS Duration:  103 QT Interval:  395 QTC Calculation: 433 R Axis:   3  Text Interpretation: Sinus rhythm Low voltage, precordial leads RSR' in V1 or V2, right VCD or RVH Nonspecific T abnormalities, anterior leads No significant change since last tracing Confirmed by Gwyneth Sprout ([id number]) on 06/14/2023 8:09:11 PM  Radiology No results found.  Procedures Procedures    Medications Ordered in ED Medications  HYDROmorphone (DILAUDID) injection 1 mg (has no administration in time range)  metoCLOPramide (REGLAN) injection 10 mg (has no administration in time range)  lactated ringers bolus 2,000 mL (has no administration in time range)    ED Course/ Medical Decision Making/ A&P                             Medical Decision Making Amount and/or Complexity of Data Reviewed External Data Reviewed: notes. Labs: ordered. Decision-making details documented in ED Course. Radiology: ordered.  Risk Prescription drug management.   Pt with multiple medical problems and comorbidities and presenting today with a complaint that caries a high risk for morbidity and mortality.  Today with nausea vomiting and diarrhea.  Could be viral in nature versus persistent symptoms now because she has not had any of her opiate for the last 12 hours.  Low suspicion for bowel obstruction  as patient has emesis and diarrhea.  She has not had an antibiotic recently suggestive of C. difficile.  Patient has mild diffuse abdominal pain but no peritoneal signs at this time and low suspicion for diverticulitis, appendicitis, pancreatitis.  Vital signs are reassuring.  I independently interpreted patient's labs and EKG.  EKG without acute findings and normal QT, BMP within normal limits, CBC with mild leukocytosis of 12 but stable hemoglobin, pregnancy is negative and blood sugar is stable.  Will give patient IV fluids, symptomatic control and will reassess.  Also lipase and LFTs are pending.  11:17 PM LFTs are within normal limits but lipase is mildly elevated at 89.  On reevaluation after pain medication and 2 L of fluid patient reports she is okay but still having abdominal pain.  It is diffuse in nature.  Will do a CT to ensure no other acute findings.  May have diffuse colitis.  Patient treated with more pain medication.         Final Clinical Impression(s) / ED Diagnoses Final diagnoses:  None    Rx / DC Orders ED Discharge Orders     None         Gwyneth Sprout, MD 06/14/23 2318

## 2023-06-15 ENCOUNTER — Inpatient Hospital Stay (HOSPITAL_COMMUNITY)
Admission: EM | Admit: 2023-06-15 | Discharge: 2023-06-21 | Disposition: A | Discharge status: Home / Self Care | Payer: BC Managed Care – PPO | Attending: Internal Medicine | Admitting: Internal Medicine

## 2023-06-15 ENCOUNTER — Observation Stay (HOSPITAL_COMMUNITY): Payer: BC Managed Care – PPO

## 2023-06-15 DIAGNOSIS — G2581 Restless legs syndrome: Secondary | ICD-10-CM | POA: Diagnosis present

## 2023-06-15 DIAGNOSIS — N390 Urinary tract infection, site not specified: Secondary | ICD-10-CM | POA: Diagnosis present

## 2023-06-15 DIAGNOSIS — F319 Bipolar disorder, unspecified: Secondary | ICD-10-CM | POA: Diagnosis present

## 2023-06-15 DIAGNOSIS — I10 Essential (primary) hypertension: Secondary | ICD-10-CM | POA: Diagnosis present

## 2023-06-15 DIAGNOSIS — Z8249 Family history of ischemic heart disease and other diseases of the circulatory system: Secondary | ICD-10-CM

## 2023-06-15 DIAGNOSIS — F419 Anxiety disorder, unspecified: Secondary | ICD-10-CM | POA: Diagnosis present

## 2023-06-15 DIAGNOSIS — G894 Chronic pain syndrome: Secondary | ICD-10-CM | POA: Diagnosis present

## 2023-06-15 DIAGNOSIS — Z880 Allergy status to penicillin: Secondary | ICD-10-CM

## 2023-06-15 DIAGNOSIS — G47411 Narcolepsy with cataplexy: Secondary | ICD-10-CM | POA: Diagnosis present

## 2023-06-15 DIAGNOSIS — A044 Other intestinal Escherichia coli infections: Principal | ICD-10-CM | POA: Diagnosis present

## 2023-06-15 DIAGNOSIS — R112 Nausea with vomiting, unspecified: Secondary | ICD-10-CM | POA: Diagnosis not present

## 2023-06-15 DIAGNOSIS — E876 Hypokalemia: Secondary | ICD-10-CM | POA: Diagnosis present

## 2023-06-15 DIAGNOSIS — K219 Gastro-esophageal reflux disease without esophagitis: Secondary | ICD-10-CM | POA: Diagnosis present

## 2023-06-15 DIAGNOSIS — A0811 Acute gastroenteropathy due to Norwalk agent: Secondary | ICD-10-CM | POA: Diagnosis present

## 2023-06-15 DIAGNOSIS — Z6841 Body Mass Index (BMI) 40.0 and over, adult: Secondary | ICD-10-CM

## 2023-06-15 DIAGNOSIS — Z882 Allergy status to sulfonamides status: Secondary | ICD-10-CM

## 2023-06-15 DIAGNOSIS — Z885 Allergy status to narcotic agent status: Secondary | ICD-10-CM

## 2023-06-15 DIAGNOSIS — B372 Candidiasis of skin and nail: Secondary | ICD-10-CM | POA: Diagnosis present

## 2023-06-15 DIAGNOSIS — M793 Panniculitis, unspecified: Secondary | ICD-10-CM | POA: Diagnosis present

## 2023-06-15 DIAGNOSIS — M1990 Unspecified osteoarthritis, unspecified site: Secondary | ICD-10-CM | POA: Diagnosis present

## 2023-06-15 DIAGNOSIS — E860 Dehydration: Secondary | ICD-10-CM | POA: Diagnosis present

## 2023-06-15 DIAGNOSIS — Z79891 Long term (current) use of opiate analgesic: Secondary | ICD-10-CM

## 2023-06-15 DIAGNOSIS — Z9109 Other allergy status, other than to drugs and biological substances: Secondary | ICD-10-CM

## 2023-06-15 DIAGNOSIS — Z9049 Acquired absence of other specified parts of digestive tract: Secondary | ICD-10-CM

## 2023-06-15 DIAGNOSIS — D72829 Elevated white blood cell count, unspecified: Secondary | ICD-10-CM | POA: Diagnosis not present

## 2023-06-15 DIAGNOSIS — Z79899 Other long term (current) drug therapy: Secondary | ICD-10-CM

## 2023-06-15 LAB — COMPREHENSIVE METABOLIC PANEL
ALT: 17 U/L (ref 0–44)
AST: 22 U/L (ref 15–41)
Albumin: 4.8 g/dL (ref 3.5–5.0)
Alkaline Phosphatase: 105 U/L (ref 38–126)
Anion gap: 15 (ref 5–15)
BUN: 11 mg/dL (ref 6–20)
CO2: 24 mmol/L (ref 22–32)
Calcium: 9.3 mg/dL (ref 8.9–10.3)
Chloride: 102 mmol/L (ref 98–111)
Creatinine, Ser: 0.77 mg/dL (ref 0.44–1.00)
GFR, Estimated: >60 mL/min (ref >60)
Glucose, Bld: 102 mg/dL — ABNORMAL HIGH (ref 70–99)
Potassium: 3.5 mmol/L (ref 3.5–5.1)
Sodium: 141 mmol/L (ref 135–145)
Total Bilirubin: 1 mg/dL (ref 0.3–1.2)
Total Protein: 8.1 g/dL (ref 6.5–8.1)

## 2023-06-15 LAB — URINALYSIS, ROUTINE W REFLEX MICROSCOPIC
Bilirubin Urine: NEGATIVE
Glucose, UA: NEGATIVE mg/dL
Ketones, ur: 20 mg/dL — AB
Nitrite: NEGATIVE
Protein, ur: 30 mg/dL — AB
Specific Gravity, Urine: 1.018 (ref 1.005–1.030)
pH: 5 (ref 5.0–8.0)

## 2023-06-15 LAB — CBC
HCT: 42.2 % (ref 36.0–46.0)
Hemoglobin: 13.9 g/dL (ref 12.0–15.0)
MCH: 28.4 pg (ref 26.0–34.0)
MCHC: 32.9 g/dL (ref 30.0–36.0)
MCV: 86.3 fL (ref 80.0–100.0)
Platelets: 255 10*3/uL (ref 150–400)
RBC: 4.89 MIL/uL (ref 3.87–5.11)
RDW: 12.5 % (ref 11.5–15.5)
WBC: 13.5 10*3/uL — ABNORMAL HIGH (ref 4.0–10.5)
nRBC: 0 % (ref 0.0–0.2)

## 2023-06-15 LAB — LIPASE, BLOOD: Lipase: 75 U/L — ABNORMAL HIGH (ref 11–51)

## 2023-06-15 MED ORDER — LORAZEPAM 2 MG/ML IJ SOLN
0.5000 mg | Freq: Once | INTRAMUSCULAR | Status: AC
  Administered 2023-06-15: 0.5 mg via INTRAVENOUS
  Filled 2023-06-15: qty 1

## 2023-06-15 MED ORDER — METOPROLOL TARTRATE 5 MG/5ML IV SOLN: 5.0000 mg | Freq: Three times a day (TID) | INTRAVENOUS | Status: DC | PRN

## 2023-06-15 MED ORDER — ENOXAPARIN SODIUM 40 MG/0.4ML IJ SOSY
40.0000 mg | PREFILLED_SYRINGE | INTRAMUSCULAR | Status: DC
  Administered 2023-06-15: 40 mg via SUBCUTANEOUS
  Filled 2023-06-15: qty 0.4

## 2023-06-15 MED ORDER — SODIUM CHLORIDE 0.9 % IV SOLN: INTRAVENOUS | Status: DC

## 2023-06-15 MED ORDER — TAPENTADOL HCL ER 50 MG PO TB12
50.0000 mg | ORAL_TABLET | Freq: Two times a day (BID) | ORAL | Status: DC
  Administered 2023-06-18 – 2023-06-21 (×5): 50 mg via ORAL
  Filled 2023-06-15 (×9): qty 1

## 2023-06-15 MED ORDER — CARIPRAZINE HCL 1.5 MG PO CAPS
6.0000 mg | ORAL_CAPSULE | Freq: Every day | ORAL | Status: DC
  Administered 2023-06-19 – 2023-06-21 (×3): 6 mg via ORAL
  Filled 2023-06-15 (×7): qty 4

## 2023-06-15 MED ORDER — TAPENTADOL HCL 50 MG PO TABS
100.0000 mg | ORAL_TABLET | Freq: Every day | ORAL | Status: DC
  Administered 2023-06-17 – 2023-06-21 (×4): 100 mg via ORAL
  Filled 2023-06-15 (×4): qty 2

## 2023-06-15 MED ORDER — NALOXONE HCL 4 MG/0.1ML NA LIQD: 1.0000 | Freq: Once | NASAL | Status: DC

## 2023-06-15 MED ORDER — NALOXONE HCL 0.4 MG/ML IJ SOLN: 0.4000 mg | INTRAMUSCULAR | Status: DC | PRN

## 2023-06-15 MED ORDER — HYDROMORPHONE HCL 1 MG/ML IJ SOLN
1.0000 mg | Freq: Once | INTRAMUSCULAR | Status: AC
  Administered 2023-06-15: 1 mg via INTRAVENOUS
  Filled 2023-06-15: qty 1

## 2023-06-15 MED ORDER — VORTIOXETINE HBR 5 MG PO TABS
20.0000 mg | ORAL_TABLET | Freq: Every day | ORAL | Status: DC
  Administered 2023-06-18 – 2023-06-21 (×4): 20 mg via ORAL
  Filled 2023-06-15: qty 4
  Filled 2023-06-15: qty 1
  Filled 2023-06-15 (×2): qty 4
  Filled 2023-06-15 (×2): qty 1
  Filled 2023-06-15: qty 4
  Filled 2023-06-15: qty 1

## 2023-06-15 MED ORDER — HYDROMORPHONE HCL 1 MG/ML IJ SOLN
0.5000 mg | INTRAMUSCULAR | Status: AC | PRN
  Administered 2023-06-15 – 2023-06-16 (×2): 0.5 mg via INTRAVENOUS
  Filled 2023-06-15 (×2): qty 0.5

## 2023-06-15 MED ORDER — SODIUM CHLORIDE 0.9 % IV SOLN
12.5000 mg | Freq: Four times a day (QID) | INTRAVENOUS | Status: DC | PRN
  Administered 2023-06-15 – 2023-06-18 (×8): 12.5 mg via INTRAVENOUS
  Filled 2023-06-15 (×8): qty 12.5

## 2023-06-15 MED ORDER — TAPENTADOL HCL ER 250 MG PO TB12: 250.0000 mg | ORAL_TABLET | Freq: Two times a day (BID) | ORAL | Status: DC

## 2023-06-15 MED ORDER — METOPROLOL TARTRATE 50 MG PO TABS
50.0000 mg | ORAL_TABLET | Freq: Two times a day (BID) | ORAL | Status: DC
  Administered 2023-06-18 – 2023-06-21 (×6): 50 mg via ORAL
  Filled 2023-06-15 (×9): qty 1

## 2023-06-15 MED ORDER — ONDANSETRON HCL 4 MG/2ML IJ SOLN
4.0000 mg | Freq: Once | INTRAMUSCULAR | Status: AC | PRN
  Administered 2023-06-15: 4 mg via INTRAVENOUS
  Filled 2023-06-15: qty 2

## 2023-06-15 MED ORDER — ONDANSETRON HCL 4 MG/2ML IJ SOLN
4.0000 mg | Freq: Four times a day (QID) | INTRAMUSCULAR | Status: AC | PRN
  Administered 2023-06-15 – 2023-06-16 (×2): 4 mg via INTRAVENOUS
  Filled 2023-06-15 (×2): qty 2

## 2023-06-15 MED ORDER — METOPROLOL TARTRATE 5 MG/5ML IV SOLN: 5.0000 mg | Freq: Three times a day (TID) | INTRAVENOUS | Status: DC

## 2023-06-15 MED ORDER — TAPENTADOL HCL ER 100 MG PO TB12
200.0000 mg | ORAL_TABLET | Freq: Two times a day (BID) | ORAL | Status: DC
  Administered 2023-06-18 – 2023-06-21 (×6): 200 mg via ORAL
  Filled 2023-06-15 (×9): qty 2

## 2023-06-15 MED ORDER — BUSPIRONE HCL 5 MG PO TABS
10.0000 mg | ORAL_TABLET | Freq: Two times a day (BID) | ORAL | Status: DC
  Administered 2023-06-18 – 2023-06-21 (×6): 10 mg via ORAL
  Filled 2023-06-15 (×9): qty 2

## 2023-06-15 MED ORDER — CARIPRAZINE HCL 1.5 MG PO CAPS
6.0000 mg | ORAL_CAPSULE | Freq: Every day | ORAL | Status: DC
  Filled 2023-06-15: qty 4

## 2023-06-15 MED ORDER — ONDANSETRON 4 MG PO TBDP: 4.0000 mg | ORAL_TABLET | Freq: Three times a day (TID) | ORAL | 0 refills | Status: DC | PRN

## 2023-06-15 MED ORDER — LACTATED RINGERS IV BOLUS
1000.0000 mL | Freq: Once | INTRAVENOUS | Status: AC
  Administered 2023-06-15: 1000 mL via INTRAVENOUS

## 2023-06-15 MED ORDER — PANTOPRAZOLE SODIUM 40 MG PO TBEC
40.0000 mg | DELAYED_RELEASE_TABLET | Freq: Every day | ORAL | Status: DC
  Filled 2023-06-15: qty 1

## 2023-06-15 MED ORDER — DROPERIDOL 2.5 MG/ML IJ SOLN
2.5000 mg | Freq: Once | INTRAMUSCULAR | Status: AC
  Administered 2023-06-15: 2.5 mg via INTRAVENOUS
  Filled 2023-06-15: qty 2

## 2023-06-15 MED ORDER — HYOSCYAMINE SULFATE 0.125 MG SL SUBL: 0.1250 mg | SUBLINGUAL_TABLET | Freq: Four times a day (QID) | SUBLINGUAL | 0 refills | Status: DC | PRN

## 2023-06-15 MED ORDER — VORTIOXETINE HBR 20 MG PO TABS
20.0000 mg | ORAL_TABLET | Freq: Every day | ORAL | Status: DC
  Filled 2023-06-15: qty 1

## 2023-06-15 NOTE — ED Notes (Signed)
ED TO INPATIENT HANDOFF REPORT  ED Nurse Name and Phone #: Thurmon Fair. Manson Passey [phone]  S Name/Age/Gender Holly Roberts 57 y.o. female Room/Bed: WA23/WA23  Code Status   Code Status: Prior  Home/SNF/Other Home Patient oriented to: self, place, time, and situation Is this baseline? Yes   Triage Complete: Triage complete  Chief Complaint N&V (nausea and vomiting) [R11.2]  Triage Note Pt BIBA from home. C/o N/V/D for 3x days. Has not been able to take PO meds. Was seen yesterday for same.  AOX4   Allergies Allergies  Allergen Reactions   Buprenorphine Rash    Patch gave rash- poss adhesive issues. Tried generic and brand name both. Patient states: "I am not allergic to the medication. I am allergic to the adhesive, not the medication."    Amoxicillin-Pot Clavulanate     Other reaction(s): Other (See Comments) States "strong" antibiotics cause C diff   Wound Dressing Adhesive     Rash   Sulfa Antibiotics Rash    Level of Care/Admitting Diagnosis ED Disposition     ED Disposition  Admit   Condition  --   Comment  Hospital Area: [facility] [[id number]]  Level of Care: Med-Surg [16]  May place patient in observation at [facility] or [facility] if equivalent level of care is available:: No  Covid Evaluation: Asymptomatic - no recent exposure (last 10 days) testing not required  Diagnosis: N&V (nausea and vomiting) [454098]  Admitting Physician: [name] [[id number]]  Attending Physician: [name] [[id number]]          B Medical/Surgery History Past Medical History:  Diagnosis Date   Anxiety    Arthritis    Bipolar disorder (HCC)    Chronic pain    Depression    GERD (gastroesophageal reflux disease)    Hypertension    patient denies   Past Surgical History:  Procedure Laterality Date   CHOLECYSTECTOMY N/A 04/20/2021   Procedure: LAPAROSCOPIC CHOLECYSTECTOMY WITH INTRAOPERATIVE CHOLANGIOGRAM;  Surgeon: [name], MD;  Location: [facility]S;   Service: General;  Laterality: N/A;   Right knee surgery     TONSILLECTOMY       A IV Location/Drains/Wounds Patient Lines/Drains/Airways Status     Active Line/Drains/Airways     Name Placement date Placement time Site Days   Peripheral IV 06/15/23 20 G 2.5" Anterior;Right;Upper Arm 06/15/23  1455  Arm  less than 1   Incision - 4 Ports Abdomen 1: Umbilicus 2: Mid;Upper 3: Right;Upper 4: Right;Lateral 04/20/21  1228  -- 786   Pressure Injury 07/30/21 Sacrum Mid Stage 2 -  Partial thickness loss of dermis presenting as a shallow open injury with a red, pink wound bed without slough. redness (nonblanchable), open area of skin 07/30/21  1110  -- 685   Wound / Incision (Open or Dehisced) 07/30/21 (MASD) Moisture Associated Skin Damage Buttocks Mid redness, irritation 07/30/21  1100  Buttocks  685            Intake/Output Last 24 hours No intake or output data in the 24 hours ending 06/15/23 1849  Labs/Imaging Results for orders placed or performed during the hospital encounter of 06/15/23 (from the past 48 hour(s))  Lipase, blood     Status: Abnormal   Collection Time: 06/15/23  2:13 PM  Result Value Ref Range   Lipase 75 (H) 11 - 51 U/L    Comment: Performed at [facility], [address], [place], [place] [address]  Comprehensive metabolic panel  Status: Abnormal   Collection Time: 06/15/23  2:13 PM  Result Value Ref Range   Sodium 141 135 - 145 mmol/L   Potassium 3.5 3.5 - 5.1 mmol/L   Chloride 102 98 - 111 mmol/L   CO2 24 22 - 32 mmol/L   Glucose, Bld 102 (H) 70 - 99 mg/dL    Comment: Glucose reference range applies only to samples taken after fasting for at least 8 hours.   BUN 11 6 - 20 mg/dL   Creatinine, Ser 4.09 0.44 - 1.00 mg/dL   Calcium 9.3 8.9 - 81.1 mg/dL   Total Protein 8.1 6.5 - 8.1 g/dL   Albumin 4.8 3.5 - 5.0 g/dL   AST 22 15 - 41 U/L   ALT 17 0 - 44 U/L   Alkaline Phosphatase 105 38 - 126 U/L   Total Bilirubin 1.0 0.3 - 1.2 mg/dL    GFR, Estimated >91 >47 mL/min    Comment: (NOTE) Calculated using the CKD-EPI Creatinine Equation (2021)    Anion gap 15 5 - 15    Comment: Performed at [facility], [address], [place], [place] [address]  CBC     Status: Abnormal   Collection Time: 06/15/23  2:13 PM  Result Value Ref Range   WBC 13.5 (H) 4.0 - 10.5 K/uL   RBC 4.89 3.87 - 5.11 MIL/uL   Hemoglobin 13.9 12.0 - 15.0 g/dL   HCT 21.3 08.6 - 57.8 %   MCV 86.3 80.0 - 100.0 fL   MCH 28.4 26.0 - 34.0 pg   MCHC 32.9 30.0 - 36.0 g/dL   RDW 46.9 62.9 - 52.8 %   Platelets 255 150 - 400 K/uL   nRBC 0.0 0.0 - 0.2 %    Comment: Performed at [facility], [address], [place], [place] [address]   CT ABDOMEN PELVIS W CONTRAST  Result Date: 06/14/2023 CLINICAL DATA:  Abdominal pain, nausea and vomiting EXAM: CT ABDOMEN AND PELVIS WITH CONTRAST TECHNIQUE: Multidetector CT imaging of the abdomen and pelvis was performed using the standard protocol following bolus administration of intravenous contrast. RADIATION DOSE REDUCTION: This exam was performed according to the departmental dose-optimization program which includes automated exposure control, adjustment of the mA and/or kV according to patient size and/or use of iterative reconstruction technique. CONTRAST:  OMNIPAQUE IOHEXOL 300 MG/ML  SOLN COMPARISON:  12/21/2021 FINDINGS: Lower chest: No acute pleural or parenchymal lung disease. Hepatobiliary: No focal liver abnormality is seen. Status post cholecystectomy. No biliary dilatation. Pancreas: Unremarkable. No pancreatic ductal dilatation or surrounding inflammatory changes. Spleen: Normal in size without focal abnormality. Adrenals/Urinary Tract: Adrenal glands are unremarkable. Stable appearance of the kidneys. No nephrolithiasis or obstructive uropathy. Bladder is unremarkable. Stomach/Bowel: No bowel obstruction or ileus. Normal appendix right lower quadrant. No bowel wall thickening  or inflammatory change. Vascular/Lymphatic: Aortic atherosclerosis. No enlarged abdominal or pelvic lymph nodes. Reproductive: Uterus and bilateral adnexa are unremarkable. Other: No free fluid or free intraperitoneal gas. No abdominal wall hernia. Musculoskeletal: No acute or destructive bony abnormalities. 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 Severe L5-S1 spondylosis unchanged. Reconstructed images demonstrate no additional findings. IMPRESSION: 1. No acute intra-abdominal or intrapelvic process. 2.  Aortic Atherosclerosis (ICD10-I70.0). Electronically Signed   By: Sharlet Salina M.D.   On: 06/14/2023 23:41    Pending Labs Unresulted Labs (From admission, onward)     Start     Ordered   06/15/23 1832  Gastrointestinal Panel  by PCR , Stool  (Gastrointestinal Panel by PCR, Stool                                                                                                                                                     **Does Not include CLOSTRIDIUM DIFFICILE testing. **If CDIFF testing is needed, place order from the "C Difficile Testing" order set.**)  Once,   URGENT        06/15/23 1831   06/15/23 1832  C Difficile Quick Screen w PCR reflex  (C Difficile quick screen w PCR reflex panel )  Once, for 24 hours,   URGENT       References:    CDiff Information Tool   06/15/23 1831   06/15/23 1356  Urinalysis, Routine w reflex microscopic -Urine, Clean Catch  Once,   URGENT       Question:  Specimen Source  Answer:  Urine, Clean Catch   06/15/23 1356            Vitals/Pain Today's Vitals   06/15/23 1553 06/15/23 1600 06/15/23 1715 06/15/23 1848  BP:  (!) 154/82  (!) 152/100  Pulse:  88  81  Resp:  17  13  Temp:  98.6 F (37 C)  98.7 F (37.1 C)  TempSrc:  Oral  Oral  SpO2:  95%  95%  Weight:      Height:      PainSc: 10-Worst pain ever  10-Worst pain ever     Isolation Precautions Enteric precautions (UV  disinfection)  Medications Medications  vortioxetine HBr (TRINTELLIX) tablet 20 mg (has no administration in time range)  cariprazine (VRAYLAR) capsule 6 mg (has no administration in time range)  promethazine (PHENERGAN) 12.5 mg in sodium chloride 0.9 % 50 mL IVPB (has no administration in time range)  0.9 %  sodium chloride infusion (has no administration in time range)  ondansetron (ZOFRAN) injection 4 mg (4 mg Intravenous Given 06/15/23 1458)  HYDROmorphone (DILAUDID) injection 1 mg (1 mg Intravenous Given 06/15/23 1523)  droperidol (INAPSINE) 2.5 MG/ML injection 2.5 mg (2.5 mg Intravenous Given 06/15/23 1523)  lactated ringers bolus 1,000 mL (1,000 mLs Intravenous New Bag/Given 06/15/23 1523)  HYDROmorphone (DILAUDID) injection 1 mg (1 mg Intravenous Given 06/15/23 1650)    Mobility walks with device     Focused Assessments Diffuse abd pain, persistent nausea. Abd soft and non-tender.  Restless legs.    R Recommendations: See Admitting Provider Note  Report given to:   Additional Notes: A/Ox4, independent at baseline. On antipsychotics that pt has not had in 3-4 days due to illness. Good family support. Niece at bedside.

## 2023-06-15 NOTE — Progress Notes (Signed)
Patient doesn't want to take any of her oral medications because she doesn't think she'll be able to keep them down.

## 2023-06-15 NOTE — ED Provider Notes (Signed)
I assumed care of this patient from previous provider.  Please see their note for further details of history, exam, and MDM.   Briefly patient is a 57 y.o. female who presented with nausea vomiting diarrhea.  Diarrhea reported to be chronic since having her gallbladder taken out.  Workup thus far has been reassuring with negative CT scan.  Patient has been given several rounds of pain medicine and antiemetics.  Plan to p.o. challenge.  Patient was given additional GI cocktail with Maalox and Levsin.  On my reassessment at 2:21 AM, patient was resting comfortably.  Reported that she was able to keep the medication down.  Stable for discharge.  The patient appears reasonably screened and/or stabilized for discharge and I doubt any other medical condition or other [facility] requiring further screening, evaluation, or treatment in the ED at this time. I have discussed the findings, Dx and Tx plan with the patient/family who expressed understanding and agree(s) with the plan. Discharge instructions discussed at length. The patient/family was given strict return precautions who verbalized understanding of the instructions. No further questions at time of discharge.  Disposition: Discharge  Condition: Good  ED Discharge Orders          Ordered    ondansetron (ZOFRAN-ODT) 4 MG disintegrating tablet  Every 8 hours PRN        06/15/23 0222    hyoscyamine (LEVSIN/SL) 0.125 MG SL tablet  4 times daily PRN        06/15/23 0222             Follow Up: Ivonne Andrew, NP [address] [place] [address] [phone]  Call  to schedule an appointment for close follow up          Anakaren Campion, Amadeo Garnet, MD 06/15/23 [phone]

## 2023-06-15 NOTE — Progress Notes (Signed)
NP Virgel Manifold was messaged because patient is c/o abd pain 6 out of 10 and restless legs. She has no pain PRNs medications ordered.

## 2023-06-15 NOTE — H&P (Signed)
History and Physical    Holly Roberts NWG:[id number] DOB: 06/28/1966 DOA: 06/15/2023  PCP: Ivonne Andrew, NP Patient coming from: home  I have personally briefly reviewed patient's old medical records in [facility]  Chief Complaint: ab pain, n/v/d  HPI: Holly Roberts is a 57 y.o. female with medical history significant of bipolar disorder, anxiety, depression, primary narcolepsy with catalepsy, chronic pain syndrome, chronic diarrhea since gallbladder surgery, presents with  n/v/ab pain, was seen in the ED earlier with negative CT , was discharged home , however returned to the ED 6hrs after discharged from the ED with persistent symptom , not able tolerate oral meds, appear dehydrated with sinus tachycardia  ED Course:  Data reviewed: Blood pressure (!) 154/82, pulse 88, temperature 98.6 F (37 C), temperature source Oral, resp. rate 17, height 5\' 4"  (1.626 m), weight 115 kg, SpO2 95 %.  Wbc 12.8-13.5 Cr 0.77 Lipase 75, though pancreas unremarkable on Ct done this am  She was given hydration, analgesics and antiemetic and hospitalist call to further manage the patient, I requested gi pcr panel and cdiff testing   Review of Systems: As per HPI otherwise all other systems reviewed and are negative.   Past Medical History:  Diagnosis Date   Anxiety    Arthritis    Bipolar disorder (HCC)    Chronic pain    Depression    GERD (gastroesophageal reflux disease)    Hypertension    patient denies    Past Surgical History:  Procedure Laterality Date   CHOLECYSTECTOMY N/A 04/20/2021   Procedure: LAPAROSCOPIC CHOLECYSTECTOMY WITH INTRAOPERATIVE CHOLANGIOGRAM;  Surgeon: [name], MD;  Location: [facility];  Service: General;  Laterality: N/A;   Right knee surgery     TONSILLECTOMY      Social History  reports that she has never smoked. She has never used smokeless tobacco. She reports current alcohol use. She reports that she does not use drugs.  Allergies  Allergen  Reactions   Buprenorphine Rash    Patch gave rash- poss adhesive issues. Tried generic and brand name both. Patient states: "I am not allergic to the medication. I am allergic to the adhesive, not the medication."    Amoxicillin-Pot Clavulanate     Other reaction(s): Other (See Comments) States "strong" antibiotics cause C diff   Wound Dressing Adhesive     Rash   Sulfa Antibiotics Rash    Family History  Problem Relation Age of Onset   Diverticulitis Mother    Autoimmune disease Mother        heptatic   Heart attack Mother        x2   Cancer Father    Pancreatic cancer Maternal Grandmother    Ulcerative colitis Maternal Grandfather    Colon cancer Neg Hx    Esophageal cancer Neg Hx     Prior to Admission medications   Medication Sig Start Date End Date Taking? Authorizing Provider  amLODipine (NORVASC) 10 MG tablet TAKE 1 TABLET BY MOUTH EVERY DAY 09/14/22   Ivonne Andrew, NP  Armodafinil 250 MG tablet Take 250 mg by mouth every morning. 04/09/21   [provider]  busPIRone (BUSPAR) 10 MG tablet TAKE 1 TABLET(10 MG) BY MOUTH TWICE DAILY 04/29/23   Ivonne Andrew, NP  cholestyramine (QUESTRAN) 4 g packet TAKE 2 PACKETS (8 G TOTAL) BY MOUTH 2 (TWO) TIMES DAILY WITH A MEAL. 05/29/22   Ivonne Andrew, NP  cholestyramine Lanetta Inch) 4 GM/DOSE powder Take 1 packet (  4 g total) by mouth 2 (two) times daily with a meal. 05/09/22   Cirigliano, Vito V, DO  cyanocobalamin (VITAMIN B12) 500 MCG tablet Take 500 mcg by mouth daily.    [provider]  diclofenac (VOLTAREN) 50 MG EC tablet Take 50 mg by mouth daily. 10/08/21   [provider]  folic acid (FOLVITE) 1 MG tablet TAKE 1 TABLET BY MOUTH EVERY DAY 09/14/22   Ivonne Andrew, NP  HORIZANT 600 MG TBCR Take 600-1,200 mg by mouth in the morning and at bedtime. Take 1 tablet (600 mg) in the morning and Take 2 tablets (1200 mg) at bedtime 05/29/21   [provider]  hydrOXYzine (ATARAX) 10 MG tablet  TAKE 1 TABLET BY MOUTH THREE TIMES A DAY AS NEEDED 12/14/22   Ivonne Andrew, NP  hyoscyamine (LEVSIN/SL) 0.125 MG SL tablet Place 1 tablet (0.125 mg total) under the tongue 4 (four) times daily as needed for up to 5 days. 06/15/23 06/20/23  Nira Conn, MD  metoprolol tartrate (LOPRESSOR) 50 MG tablet TAKE 1 TABLET BY MOUTH TWICE A DAY 09/14/22   Ivonne Andrew, NP  Multiple Vitamin (MULTIVITAMIN WITH MINERALS) TABS tablet Take 1 tablet by mouth daily. Patient taking differently: Take 1 tablet by mouth daily. Centrum 08/11/21   Rai, Ripudeep K, MD  NUCYNTA 100 MG TABS Take 2 tablets (200 mg total) by mouth daily in the afternoon. Patient taking differently: Take 100 mg by mouth daily in the afternoon. Afternoon 08/11/21   Medina-Vargas, Monina C, NP  NUCYNTA ER 250 MG TB12 Take 250 mg by mouth 2 (two) times daily. 12/07/21   [provider]  nystatin cream (MYCOSTATIN) Apply 1 Application topically 2 (two) times daily. 03/14/23   Ivonne Andrew, NP  ondansetron (ZOFRAN-ODT) 4 MG disintegrating tablet Take 1 tablet (4 mg total) by mouth every 8 (eight) hours as needed for up to 3 days for nausea or vomiting. 06/15/23 06/18/23  Nira Conn, MD  pantoprazole (PROTONIX) 40 MG tablet TAKE 1 TABLET BY MOUTH EVERY DAY 09/14/22   Ivonne Andrew, NP  QUVIVIQ 25 MG TABS Take 25 tablets by mouth at bedtime. 12/08/21   [provider]  TRINTELLIX 20 MG TABS tablet Take 20 mg by mouth daily. 06/08/21   [provider]  vitamin B-12 (CYANOCOBALAMIN) 100 MCG tablet TAKE 1 TABLET BY MOUTH EVERY DAY 12/14/22   Ivonne Andrew, NP  VRAYLAR 6 MG CAPS Take 6 mg by mouth daily. 07/11/21   [provider]    Physical Exam: Vitals:   06/15/23 1350 06/15/23 1353 06/15/23 1354 06/15/23 1600  BP:  (!) 179/96 (!) 179/96 (!) 154/82  Pulse:  (!) 119 (!) 116 88  Resp:  (!) 24  17  Temp:  98.1 F (36.7 C)  98.6 F (37 C)  TempSrc:  Oral  Oral  SpO2: 94% 96% 97% 95%   Weight:   115 kg   Height:   5\' 4"  (1.626 m)     Constitutional: does not appear comfortable, appear weak, c/o restless leg  Eyes: PERRL, lids and conjunctivae normal ENMT: dry oral musoca Respiratory: clear to auscultation bilaterally, no wheezing, no crackles. Normal respiratory effort. No accessory muscle use.  Cardiovascular: Regular rate and rhythm,  No extremity edema. 2+ pedal pulses. No carotid bruits.  Abdomen: no tenderness, not distended, Bowel sounds positive.  Musculoskeletal: no clubbing / cyanosis. No joint deformity upper and lower extremities. Good ROM, no contractures. Normal  muscle tone.  Skin: no rashes, lesions, ulcers. No induration Neurologic: CN 2-12 grossly intact. Sensation intact, Strength 5/5 in all 4.  Psychiatric: weak, drowsy    Labs on Admission: I have personally reviewed following labs and imaging studies  CBC: Recent Labs  Lab 06/14/23 1735 06/15/23 1413  WBC 12.8* 13.5*  HGB 14.4 13.9  HCT 44.2 42.2  MCV 86.3 86.3  PLT 261 255    Basic Metabolic Panel: Recent Labs  Lab 06/14/23 1735 06/15/23 1413  NA 138 141  K 3.5 3.5  CL 102 102  CO2 23 24  GLUCOSE 108* 102*  BUN 13 11  CREATININE 0.79 0.77  CALCIUM 9.4 9.3    GFR: Estimated Creatinine Clearance: 96.5 mL/min (by C-G formula based on SCr of 0.77 mg/dL).  Liver Function Tests: Recent Labs  Lab 06/14/23 1735 06/15/23 1413  AST 19 22  ALT 15 17  ALKPHOS 110 105  BILITOT 0.6 1.0  PROT 8.1 8.1  ALBUMIN 4.8 4.8    Urine analysis:    Component Value Date/Time   COLORURINE AMBER (A) 12/22/2021 0546   APPEARANCEUR CLOUDY (A) 12/22/2021 0546   LABSPEC 1.009 12/22/2021 0546   PHURINE 8.0 12/22/2021 0546   GLUCOSEU NEGATIVE 12/22/2021 0546   HGBUR SMALL (A) 12/22/2021 0546   BILIRUBINUR MODERATE (A) 12/22/2021 0546   KETONESUR 5 (A) 12/22/2021 0546   PROTEINUR 100 (A) 12/22/2021 0546   NITRITE NEGATIVE 12/22/2021 0546   LEUKOCYTESUR MODERATE (A) 12/22/2021 0546     Radiological Exams on Admission: CT ABDOMEN PELVIS W CONTRAST  Result Date: 06/14/2023 CLINICAL DATA:  Abdominal pain, nausea and vomiting EXAM: CT ABDOMEN AND PELVIS WITH CONTRAST TECHNIQUE: Multidetector CT imaging of the abdomen and pelvis was performed using the standard protocol following bolus administration of intravenous contrast. RADIATION DOSE REDUCTION: This exam was performed according to the departmental dose-optimization program which includes automated exposure control, adjustment of the mA and/or kV according to patient size and/or use of iterative reconstruction technique. CONTRAST:  OMNIPAQUE IOHEXOL 300 MG/ML  SOLN COMPARISON:  12/21/2021 FINDINGS: Lower chest: No acute pleural or parenchymal lung disease. Hepatobiliary: No focal liver abnormality is seen. Status post cholecystectomy. No biliary dilatation. Pancreas: Unremarkable. No pancreatic ductal dilatation or surrounding inflammatory changes. Spleen: Normal in size without focal abnormality. Adrenals/Urinary Tract: Adrenal glands are unremarkable. Stable appearance of the kidneys. No nephrolithiasis or obstructive uropathy. Bladder is unremarkable. Stomach/Bowel: No bowel obstruction or ileus. Normal appendix right lower quadrant. No bowel wall thickening or inflammatory change. Vascular/Lymphatic: Aortic atherosclerosis. No enlarged abdominal or pelvic lymph nodes. Reproductive: Uterus and bilateral adnexa are unremarkable. Other: No free fluid or free intraperitoneal gas. No abdominal wall hernia. Musculoskeletal: No acute or destructive bony abnormalities. 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 Severe L5-S1 spondylosis unchanged. Reconstructed images demonstrate no additional findings. IMPRESSION: 1. No acute intra-abdominal or intrapelvic process. 2.  Aortic Atherosclerosis (ICD10-I70.0). Electronically Signed   By: Sharlet Salina M.D.   On: 06/14/2023 23:41    EKG: Independently  reviewed.   Assessment/Plan Active Problems:   * No active hospital problems. *   N/V/D/Ab pain -CT abdomen no acute findings, check GI PCR, C. Difficile -IV hydration, supportive management -Clear liquid diet for now, advance as tolerated   Leukocytosis Likely from dehydration Blood culture to be complete  HTN Continue Lopressor and Norvasc   Chronic pain, on  nucynta, given as needed Dilaudid as well   bipolar disorder, anxiety, depression, primary narcolepsy  with catalepsy, chronic pain syndrome,  Continue home medication   Body mass index is 43.52 kg/m.   DVT prophylaxis: Lovenox   Code Status:    Family Communication:  Niece at bedside   Patient is from: Home   Anticipated DC to: Home   Anticipated DC date: Likely tomorrow , f/u GI PCR panel, C. difficile testing, advance diet as tolerated   Consults called:  none Admission status:  observation  Severity of Illness:  Voice Recognition /Dragon dictation system was used to create this note, attempts have been made to correct errors. Please contact the author with questions and/or clarifications.  [name] MD PhD FACP [facility]  How to contact the [facility] Attending or Consulting provider 7A - 7P or covering provider during after hours 7P -7A, for this patient?   Check the care team in [facility] and look for a) attending/consulting [facility] provider listed and b) the [facility] team listed Log into [email or web address] and use [place]'s universal password to access. If you do not have the password, please contact the hospital operator. Locate the [facility] provider you are looking for under [facility] and page to a number that you can be directly reached. If you still have difficulty reaching the provider, please page the [facility] (Director on Call) for the Hospitalists listed on amion for assistance.  06/15/2023, 6:30 PM

## 2023-06-15 NOTE — ED Provider Notes (Signed)
[place] EMERGENCY DEPARTMENT AT [facility] Provider Note   CSN: [id number] Arrival date & time: 06/15/23  1342     History  Chief Complaint  Patient presents with   Nausea   Emesis   Diarrhea    Holly Roberts is a 57 y.o. female with past medical history significant for cholecystitis, bipolar, anxiety, obesity, intractable nausea and vomiting who presents with return for ongoing abdominal pain, concern for dehydration, nausea, vomiting, diarrhea for the last 3 days.  She has been unable to tolerate her p.o. medications.  Patient was seen yesterday for same, just discharged at 6 AM this morning after being able to tolerate p.o. but has not been able to tolerate p.o. since then.  She reports that it has been 3 days since she had her psychiatric medications and she feels like she really needs to get back to taking them that she does not have any return of her bipolar symptoms.  She denies any hematemesis, hematochezia she does feel quite warm and is afebrile.  Full workup yesterday including CT without emergent or surgical cause for her abdominal pain.   Emesis Associated symptoms: diarrhea   Diarrhea Associated symptoms: vomiting        Home Medications Prior to Admission medications   Medication Sig Start Date End Date Taking? Authorizing Provider  amLODipine (NORVASC) 10 MG tablet TAKE 1 TABLET BY MOUTH EVERY DAY 09/14/22  Yes Ivonne Andrew, NP  Armodafinil 250 MG tablet Take 250 mg by mouth every morning. 04/09/21  Yes [provider]  busPIRone (BUSPAR) 10 MG tablet TAKE 1 TABLET(10 MG) BY MOUTH TWICE DAILY Patient taking differently: Take 10 mg by mouth 2 (two) times daily. 04/29/23  Yes Ivonne Andrew, NP  doxepin (SINEQUAN) 100 MG capsule Take 100 mg by mouth at bedtime.   Yes [provider]  folic acid (FOLVITE) 1 MG tablet TAKE 1 TABLET BY MOUTH EVERY DAY 09/14/22  Yes Ivonne Andrew, NP  HORIZANT 600 MG TBCR Take 600-1,200 mg by mouth  in the morning and at bedtime. Take 1 tablet (600 mg) in the morning and Take 2 tablets (1200 mg) at bedtime 05/29/21  Yes [provider]  hydrOXYzine (ATARAX) 10 MG tablet TAKE 1 TABLET BY MOUTH THREE TIMES A DAY AS NEEDED 12/14/22  Yes Ivonne Andrew, NP  hyoscyamine (LEVSIN/SL) 0.125 MG SL tablet Place 1 tablet (0.125 mg total) under the tongue 4 (four) times daily as needed for up to 5 days. 06/15/23 06/20/23 Yes Cardama, Amadeo Garnet, MD  metoprolol tartrate (LOPRESSOR) 50 MG tablet TAKE 1 TABLET BY MOUTH TWICE A DAY 09/14/22  Yes Ivonne Andrew, NP  Multiple Vitamin (MULTIVITAMIN WITH MINERALS) TABS tablet Take 1 tablet by mouth daily. Patient taking differently: Take 1 tablet by mouth daily. Centrum 08/11/21  Yes Rai, Ripudeep K, MD  naloxone (NARCAN) nasal spray 4 mg/0.1 mL Place 1 spray into the nose once. 04/29/23  Yes [provider]  NUCYNTA 100 MG TABS Take 2 tablets (200 mg total) by mouth daily in the afternoon. Patient taking differently: Take 100 mg by mouth daily in the afternoon. Afternoon 08/11/21  Yes Medina-Vargas, Monina C, NP  NUCYNTA ER 250 MG TB12 Take 250 mg by mouth 2 (two) times daily. 12/07/21  Yes [provider]  ondansetron (ZOFRAN-ODT) 4 MG disintegrating tablet Take 1 tablet (4 mg total) by mouth every 8 (eight) hours as needed for up to 3 days for nausea or vomiting. 06/15/23  06/18/23 Yes Cardama, Amadeo Garnet, MD  pantoprazole (PROTONIX) 40 MG tablet TAKE 1 TABLET BY MOUTH EVERY DAY 09/14/22  Yes Ivonne Andrew, NP  QUVIVIQ 25 MG TABS Take 25 tablets by mouth at bedtime. 12/08/21  Yes [provider]  TRINTELLIX 20 MG TABS tablet Take 20 mg by mouth daily. 06/08/21  Yes [provider]  VRAYLAR 6 MG CAPS Take 6 mg by mouth daily. 07/11/21  Yes [provider]      Allergies    Buprenorphine, Amoxicillin-pot clavulanate, Wound dressing adhesive, and Sulfa antibiotics    Review of Systems   Review of Systems   Gastrointestinal:  Positive for diarrhea and vomiting.  All other systems reviewed and are negative.   Physical Exam Updated Vital Signs BP (!) 152/100   Pulse 81   Temp 98.7 F (37.1 C) (Oral)   Resp 13   Ht 5\' 4"  (1.626 m)   Wt 115 kg   SpO2 95%   BMI 43.52 kg/m  Physical Exam Vitals and nursing note reviewed.  Constitutional:      General: She is not in acute distress.    Appearance: Normal appearance.     Comments: Patient is uncomfortable appearing, having a difficult time staying still in bed but nontoxic, nonseptic appearing  HENT:     Head: Normocephalic and atraumatic.  Eyes:     General:        Right eye: No discharge.        Left eye: No discharge.  Cardiovascular:     Rate and Rhythm: Regular rhythm. Tachycardia present.     Heart sounds: No murmur heard.    No friction rub. No gallop.     Comments: Elevated heart rate with normal rhythm Pulmonary:     Effort: Pulmonary effort is normal.     Breath sounds: Normal breath sounds.  Abdominal:     General: Bowel sounds are normal.     Palpations: Abdomen is soft.     Comments: Patient with generalized tenderness throughout the abdomen, no rebound, guarding, and no distention  Skin:    General: Skin is warm and dry.     Capillary Refill: Capillary refill takes less than 2 seconds.  Neurological:     Mental Status: She is alert and oriented to person, place, and time.  Psychiatric:        Mood and Affect: Mood normal.        Behavior: Behavior normal.     ED Results / Procedures / Treatments   Labs (all labs ordered are listed, but only abnormal results are displayed) Labs Reviewed  LIPASE, BLOOD - Abnormal; Notable for the following components:      Result Value   Lipase 75 (*)    All other components within normal limits  COMPREHENSIVE METABOLIC PANEL - Abnormal; Notable for the following components:   Glucose, Bld 102 (*)    All other components within normal limits  CBC - Abnormal; Notable for  the following components:   WBC 13.5 (*)    All other components within normal limits  GASTROINTESTINAL PANEL BY PCR, STOOL (REPLACES STOOL CULTURE)  C DIFFICILE QUICK SCREEN W PCR REFLEX    URINALYSIS, ROUTINE W REFLEX MICROSCOPIC    EKG None  Radiology CT ABDOMEN PELVIS W CONTRAST  Result Date: 06/14/2023 CLINICAL DATA:  Abdominal pain, nausea and vomiting EXAM: CT ABDOMEN AND PELVIS WITH CONTRAST TECHNIQUE: Multidetector CT imaging of the abdomen and pelvis was performed using the standard  protocol following bolus administration of intravenous contrast. RADIATION DOSE REDUCTION: This exam was performed according to the departmental dose-optimization program which includes automated exposure control, adjustment of the mA and/or kV according to patient size and/or use of iterative reconstruction technique. CONTRAST:  OMNIPAQUE IOHEXOL 300 MG/ML  SOLN COMPARISON:  12/21/2021 FINDINGS: Lower chest: No acute pleural or parenchymal lung disease. Hepatobiliary: No focal liver abnormality is seen. Status post cholecystectomy. No biliary dilatation. Pancreas: Unremarkable. No pancreatic ductal dilatation or surrounding inflammatory changes. Spleen: Normal in size without focal abnormality. Adrenals/Urinary Tract: Adrenal glands are unremarkable. Stable appearance of the kidneys. No nephrolithiasis or obstructive uropathy. Bladder is unremarkable. Stomach/Bowel: No bowel obstruction or ileus. Normal appendix right lower quadrant. No bowel wall thickening or inflammatory change. Vascular/Lymphatic: Aortic atherosclerosis. No enlarged abdominal or pelvic lymph nodes. Reproductive: Uterus and bilateral adnexa are unremarkable. Other: No free fluid or free intraperitoneal gas. No abdominal wall hernia. Musculoskeletal: No acute or destructive bony abnormalities. 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 Severe L5-S1 spondylosis unchanged. Reconstructed images demonstrate no  additional findings. IMPRESSION: 1. No acute intra-abdominal or intrapelvic process. 2.  Aortic Atherosclerosis (ICD10-I70.0). Electronically Signed   By: Sharlet Salina M.D.   On: 06/14/2023 23:41    Procedures Procedures    Medications Ordered in ED Medications  vortioxetine HBr (TRINTELLIX) tablet 20 mg (has no administration in time range)  cariprazine (VRAYLAR) capsule 6 mg (has no administration in time range)  promethazine (PHENERGAN) 12.5 mg in sodium chloride 0.9 % 50 mL IVPB (has no administration in time range)  0.9 %  sodium chloride infusion (has no administration in time range)  ondansetron (ZOFRAN) injection 4 mg (4 mg Intravenous Given 06/15/23 1458)  HYDROmorphone (DILAUDID) injection 1 mg (1 mg Intravenous Given 06/15/23 1523)  droperidol (INAPSINE) 2.5 MG/ML injection 2.5 mg (2.5 mg Intravenous Given 06/15/23 1523)  lactated ringers bolus 1,000 mL (0 mLs Intravenous Stopped 06/15/23 1623)  HYDROmorphone (DILAUDID) injection 1 mg (1 mg Intravenous Given 06/15/23 1650)    ED Course/ Medical Decision Making/ A&P                             Medical Decision Making Amount and/or Complexity of Data Reviewed Labs: ordered.  Risk Prescription drug management.   This patient is a 57 y.o. female  who presents to the ED for concern of nausea, vomiting, diarrhea, abdominal pain.   Differential diagnoses prior to evaluation: The emergent differential diagnosis includes, but is not limited to,  The causes of generalized abdominal pain include but are not limited to AAA, mesenteric ischemia, appendicitis, diverticulitis, DKA, gastritis, gastroenteritis, AMI, nephrolithiasis, pancreatitis, peritonitis, adrenal insufficiency,lead poisoning, iron toxicity, intestinal ischemia, constipation, UTI,SBO/LBO, splenic rupture, biliary disease, IBD, IBS, PUD, or hepatitis. This is not an exhaustive differential.   Past Medical History / Co-morbidities / Social History: Bipolar, previous  cholecystitis, obesity, depression, hypertension, chronic pain   Additional history: Chart reviewed. Pertinent results include: Sensibly reviewed lab work, imaging, notably with lab work and imaging performed just yesterday, with no emergent or surgical cause of her abdominal pain noted on CT  Physical Exam: Physical exam performed. The pertinent findings include: Diffusely tender throughout the abdomen, initially with tachycardia, and dry mucous membranes, no rebound, rigidity, guarding throughout.  She is hypertensive in the emergency department, blood pressure 179/96 on arrival.  Lab Tests/Imaging studies: I personally interpreted labs/imaging and the pertinent results include: Mildly elevated  leukocytosis, white blood cells 13.5, her lipase is elevated at 75, not greater than 3 times upper limit of normal and improved from yesterday.  Her CMP is unremarkable.  UA, GI PCR panel, C. difficile screen are pending at this time..   Cardiac monitoring: EKG obtained and interpreted by myself and attending physician which shows: Sinus tachycardia   Medications: I ordered medication including Dilaudid, droperidol, Zofran for pain, nausea, fluids for dehydration.  I have reviewed the patients home medicines and have made adjustments as needed.  Consults: Spoke with the hospitalist, Dr. Roda Shutters, after reviewing patient's clinical condition, relevant lab work, imaging discussed that patient would benefit from admission for intractable nausea and vomiting with failure to discharge home just this morning for presentation of same.  Dr. Roda Shutters accepts admission at this time.   Disposition: After consideration of the diagnostic results and the patients response to treatment, I feel that patient would benefit from admission as discussed above.   Final Clinical Impression(s) / ED Diagnoses Final diagnoses:  Nausea vomiting and diarrhea    Rx / DC Orders ED Discharge Orders     None         [place] 06/15/23 Donna Bernard, MD 06/15/23 2104

## 2023-06-15 NOTE — ED Notes (Signed)
Pt's daughter states last time pt had enterocolitis pt had to be admitted for several weeks.

## 2023-06-15 NOTE — ED Triage Notes (Signed)
Pt BIBA from home. C/o N/V/D for 3x days. Has not been able to take PO meds. Was seen yesterday for same.  AOX4

## 2023-06-16 DIAGNOSIS — E860 Dehydration: Secondary | ICD-10-CM | POA: Diagnosis present

## 2023-06-16 DIAGNOSIS — R197 Diarrhea, unspecified: Secondary | ICD-10-CM

## 2023-06-16 DIAGNOSIS — I10 Essential (primary) hypertension: Secondary | ICD-10-CM | POA: Diagnosis present

## 2023-06-16 DIAGNOSIS — M793 Panniculitis, unspecified: Secondary | ICD-10-CM | POA: Diagnosis present

## 2023-06-16 DIAGNOSIS — N390 Urinary tract infection, site not specified: Secondary | ICD-10-CM | POA: Diagnosis present

## 2023-06-16 DIAGNOSIS — Z9109 Other allergy status, other than to drugs and biological substances: Secondary | ICD-10-CM | POA: Diagnosis not present

## 2023-06-16 DIAGNOSIS — E876 Hypokalemia: Secondary | ICD-10-CM | POA: Diagnosis present

## 2023-06-16 DIAGNOSIS — R112 Nausea with vomiting, unspecified: Secondary | ICD-10-CM | POA: Diagnosis present

## 2023-06-16 DIAGNOSIS — A044 Other intestinal Escherichia coli infections: Secondary | ICD-10-CM | POA: Diagnosis present

## 2023-06-16 DIAGNOSIS — G47411 Narcolepsy with cataplexy: Secondary | ICD-10-CM | POA: Diagnosis present

## 2023-06-16 DIAGNOSIS — Z79899 Other long term (current) drug therapy: Secondary | ICD-10-CM | POA: Diagnosis not present

## 2023-06-16 DIAGNOSIS — F3131 Bipolar disorder, current episode depressed, mild: Secondary | ICD-10-CM | POA: Diagnosis not present

## 2023-06-16 DIAGNOSIS — R1114 Bilious vomiting: Secondary | ICD-10-CM | POA: Diagnosis not present

## 2023-06-16 DIAGNOSIS — Z9049 Acquired absence of other specified parts of digestive tract: Secondary | ICD-10-CM | POA: Diagnosis not present

## 2023-06-16 DIAGNOSIS — M1990 Unspecified osteoarthritis, unspecified site: Secondary | ICD-10-CM | POA: Diagnosis present

## 2023-06-16 DIAGNOSIS — Z8249 Family history of ischemic heart disease and other diseases of the circulatory system: Secondary | ICD-10-CM | POA: Diagnosis not present

## 2023-06-16 DIAGNOSIS — Z6841 Body Mass Index (BMI) 40.0 and over, adult: Secondary | ICD-10-CM | POA: Diagnosis not present

## 2023-06-16 DIAGNOSIS — G2581 Restless legs syndrome: Secondary | ICD-10-CM | POA: Diagnosis present

## 2023-06-16 DIAGNOSIS — F419 Anxiety disorder, unspecified: Secondary | ICD-10-CM | POA: Diagnosis present

## 2023-06-16 DIAGNOSIS — Z79891 Long term (current) use of opiate analgesic: Secondary | ICD-10-CM | POA: Diagnosis not present

## 2023-06-16 DIAGNOSIS — K219 Gastro-esophageal reflux disease without esophagitis: Secondary | ICD-10-CM | POA: Diagnosis present

## 2023-06-16 DIAGNOSIS — Z882 Allergy status to sulfonamides status: Secondary | ICD-10-CM | POA: Diagnosis not present

## 2023-06-16 DIAGNOSIS — B372 Candidiasis of skin and nail: Secondary | ICD-10-CM | POA: Diagnosis present

## 2023-06-16 DIAGNOSIS — Z885 Allergy status to narcotic agent status: Secondary | ICD-10-CM | POA: Diagnosis not present

## 2023-06-16 DIAGNOSIS — F319 Bipolar disorder, unspecified: Secondary | ICD-10-CM | POA: Diagnosis present

## 2023-06-16 DIAGNOSIS — Z880 Allergy status to penicillin: Secondary | ICD-10-CM | POA: Diagnosis not present

## 2023-06-16 DIAGNOSIS — G894 Chronic pain syndrome: Secondary | ICD-10-CM | POA: Diagnosis present

## 2023-06-16 LAB — CBC
HCT: 41.8 % (ref 36.0–46.0)
Hemoglobin: 13.6 g/dL (ref 12.0–15.0)
MCH: 28.5 pg (ref 26.0–34.0)
MCHC: 32.5 g/dL (ref 30.0–36.0)
MCV: 87.4 fL (ref 80.0–100.0)
Platelets: 237 10*3/uL (ref 150–400)
RBC: 4.78 MIL/uL (ref 3.87–5.11)
RDW: 12.6 % (ref 11.5–15.5)
WBC: 10.5 10*3/uL (ref 4.0–10.5)
nRBC: 0 % (ref 0.0–0.2)

## 2023-06-16 LAB — COMPREHENSIVE METABOLIC PANEL
ALT: 24 U/L (ref 0–44)
AST: 31 U/L (ref 15–41)
Albumin: 4.7 g/dL (ref 3.5–5.0)
Alkaline Phosphatase: 102 U/L (ref 38–126)
Anion gap: 14 (ref 5–15)
BUN: 10 mg/dL (ref 6–20)
CO2: 23 mmol/L (ref 22–32)
Calcium: 9.2 mg/dL (ref 8.9–10.3)
Chloride: 102 mmol/L (ref 98–111)
Creatinine, Ser: 0.79 mg/dL (ref 0.44–1.00)
GFR, Estimated: >60 mL/min (ref >60)
Glucose, Bld: 82 mg/dL (ref 70–99)
Potassium: 3.3 mmol/L — ABNORMAL LOW (ref 3.5–5.1)
Sodium: 139 mmol/L (ref 135–145)
Total Bilirubin: 1.2 mg/dL (ref 0.3–1.2)
Total Protein: 8 g/dL (ref 6.5–8.1)

## 2023-06-16 LAB — RAPID URINE DRUG SCREEN, HOSP PERFORMED
Amphetamines: NOT DETECTED
Barbiturates: NOT DETECTED
Benzodiazepines: NOT DETECTED
Cocaine: NOT DETECTED
Opiates: POSITIVE — AB
Tetrahydrocannabinol: NOT DETECTED

## 2023-06-16 LAB — C DIFFICILE QUICK SCREEN W PCR REFLEX
C Diff antigen: NEGATIVE
C Diff interpretation: NOT DETECTED
C Diff toxin: NEGATIVE

## 2023-06-16 LAB — HIV ANTIBODY (ROUTINE TESTING W REFLEX): HIV Screen 4th Generation wRfx: NONREACTIVE

## 2023-06-16 MED ORDER — GABAPENTIN 400 MG PO CAPS
1200.0000 mg | ORAL_CAPSULE | Freq: Every day | ORAL | Status: DC
  Administered 2023-06-19 – 2023-06-20 (×2): 1200 mg via ORAL
  Filled 2023-06-16 (×5): qty 3

## 2023-06-16 MED ORDER — GABAPENTIN ENACARBIL ER 600 MG PO TBCR: 600.0000 mg | EXTENDED_RELEASE_TABLET | Freq: Two times a day (BID) | ORAL | Status: DC

## 2023-06-16 MED ORDER — HYDROMORPHONE HCL 1 MG/ML IJ SOLN
0.5000 mg | INTRAMUSCULAR | Status: DC | PRN
  Administered 2023-06-16 – 2023-06-21 (×19): 0.5 mg via INTRAVENOUS
  Filled 2023-06-16 (×19): qty 0.5

## 2023-06-16 MED ORDER — DIPHENHYDRAMINE HCL 50 MG/ML IJ SOLN
25.0000 mg | Freq: Four times a day (QID) | INTRAMUSCULAR | Status: AC | PRN
  Administered 2023-06-16 – 2023-06-18 (×3): 25 mg via INTRAMUSCULAR
  Filled 2023-06-16 (×4): qty 1

## 2023-06-16 MED ORDER — ZIPRASIDONE MESYLATE 20 MG IM SOLR
10.0000 mg | Freq: Four times a day (QID) | INTRAMUSCULAR | Status: AC | PRN
  Administered 2023-06-16 – 2023-06-18 (×3): 10 mg via INTRAMUSCULAR
  Filled 2023-06-16 (×4): qty 20

## 2023-06-16 MED ORDER — SODIUM CHLORIDE 0.9 % IV SOLN
2.0000 g | INTRAVENOUS | Status: DC
  Administered 2023-06-16 – 2023-06-18 (×3): 2 g via INTRAVENOUS
  Filled 2023-06-16 (×4): qty 20

## 2023-06-16 MED ORDER — PANTOPRAZOLE SODIUM 40 MG IV SOLR
40.0000 mg | Freq: Two times a day (BID) | INTRAVENOUS | Status: DC
  Administered 2023-06-16 – 2023-06-21 (×10): 40 mg via INTRAVENOUS
  Filled 2023-06-16 (×11): qty 10

## 2023-06-16 MED ORDER — NYSTATIN 100000 UNIT/GM EX POWD
Freq: Three times a day (TID) | CUTANEOUS | Status: DC
  Administered 2023-06-17 – 2023-06-18 (×2): 1 via TOPICAL
  Filled 2023-06-16: qty 15

## 2023-06-16 MED ORDER — HYDROMORPHONE HCL 1 MG/ML IJ SOLN
0.5000 mg | INTRAMUSCULAR | Status: DC | PRN
  Administered 2023-06-16: 0.5 mg via INTRAVENOUS
  Filled 2023-06-16: qty 0.5

## 2023-06-16 MED ORDER — POTASSIUM CHLORIDE 10 MEQ/100ML IV SOLN
10.0000 meq | INTRAVENOUS | Status: AC
  Administered 2023-06-16 (×3): 10 meq via INTRAVENOUS
  Filled 2023-06-16 (×3): qty 100

## 2023-06-16 MED ORDER — SODIUM CHLORIDE 0.9 % IV SOLN: INTRAVENOUS | Status: AC

## 2023-06-16 MED ORDER — ACETAMINOPHEN 10 MG/ML IV SOLN
1000.0000 mg | Freq: Once | INTRAVENOUS | Status: AC
  Administered 2023-06-16: 1000 mg via INTRAVENOUS
  Filled 2023-06-16: qty 100

## 2023-06-16 MED ORDER — KETOROLAC TROMETHAMINE 15 MG/ML IJ SOLN
7.5000 mg | Freq: Three times a day (TID) | INTRAMUSCULAR | Status: AC | PRN
  Administered 2023-06-16 – 2023-06-21 (×8): 7.5 mg via INTRAVENOUS
  Filled 2023-06-16 (×9): qty 1

## 2023-06-16 MED ORDER — ARMODAFINIL 250 MG PO TABS
250.0000 mg | ORAL_TABLET | Freq: Every morning | ORAL | Status: DC
  Administered 2023-06-20 – 2023-06-21 (×2): 250 mg via ORAL

## 2023-06-16 MED ORDER — LORAZEPAM 2 MG/ML IJ SOLN
0.5000 mg | Freq: Once | INTRAMUSCULAR | Status: AC
  Administered 2023-06-16: 0.5 mg via INTRAVENOUS
  Filled 2023-06-16: qty 1

## 2023-06-16 MED ORDER — GABAPENTIN 400 MG PO CAPS
600.0000 mg | ORAL_CAPSULE | Freq: Every day | ORAL | Status: DC
  Administered 2023-06-17 – 2023-06-21 (×5): 600 mg via ORAL
  Filled 2023-06-16 (×5): qty 2

## 2023-06-16 NOTE — Progress Notes (Signed)
PROGRESS NOTE    Holly Roberts  YNW:[id number] DOB: 10-Jan-1966 DOA: 06/15/2023 PCP: Ivonne Andrew, NP   Brief Narrative: 57 year old with past medical history significant for bipolar disorder, anxiety, depression, primary narcolepsy catalepsy, chronic pain syndrome, chronic diarrhea since she had cholecystectomy, Presented with Nausea Vomiting Abdominal Pain.  CT Scan Was Negative.  She was Initially Evaluated in the ED and was Discharged Home She Presented 6 Hours after with Persistent Symptoms.     Assessment & Plan:   Principal Problem:   N&V (nausea and vomiting)  1-Nausea vomiting and abdominal pain: -CT abdomen and pelvis no acute finding. -Continue with IV fluids. -C diff negative,  -Check Urine culture. UA with 21-50 WBC -CT abdomen pelvis negative.  -IV PPI -PRN Zofran.  -PRN dilaudid for 24 hours.  -IV fluids.   Leukocytosis: Normalized.   Hypertension: on Metoprolol.  IV metoprolol/   Chronic pain syndrome: Continue with Nucynta. Refuse oral meds.  PRN Dilaudid.   Bipolar disorder, anxiety, depression, primary narcolepsy with cataplexy, chronic pain syndrome: Refuse takes oral meds.  Askin for ativan for restless syndrome.  She continue to moves frequently in beds, changes position, restless. Gave one time dose ativan.  Relates can't take oral meds.  Psych consulted to assist with meds.        Pressure Injury 07/30/21 Sacrum Mid Stage 2 -  Partial thickness loss of dermis presenting as a shallow open injury with a red, pink wound bed without slough. redness (nonblanchable), open area of skin (Active)  07/30/21 1110  Location: Sacrum  Location Orientation: Mid  Staging: Stage 2 -  Partial thickness loss of dermis presenting as a shallow open injury with a red, pink wound bed without slough.  Wound Description (Comments): redness (nonblanchable), open area of skin  Present on Admission: Yes     Estimated body mass index is 43.52 kg/m as  calculated from the following:   Height as of this encounter: 5\' 4"  (1.626 m).   Weight as of this encounter: 115 kg.   DVT prophylaxis: Lovenox Code Status: Full code Family Communication:Care discussed with daughter Disposition Plan:  Status is: Observation The patient remains OBS appropriate and will d/c before 2 midnights.    Consultants:  Psych   Procedures:    Antimicrobials:    Subjective: She keeps eyes close. She relates she can't take meds. She continue to moves frequently in the bed from side to side.   Objective: Vitals:   06/15/23 1848 06/15/23 1920 06/16/23 0001 06/16/23 0420  BP: (!) 152/100 (!) 156/106 (!) 150/100 (!) 158/110  Pulse: 81 94 (!) 107   Resp: 13 18 18 18   Temp: 98.7 F (37.1 C) 98.1 F (36.7 C) 97.6 F (36.4 C) 98.1 F (36.7 C)  TempSrc: Oral Oral Oral Oral  SpO2: 95% 100% 93% 96%  Weight:      Height:        Intake/Output Summary (Last 24 hours) at 06/16/2023 0738 Last data filed at 06/16/2023 0547 Gross per 24 hour  Intake 1785.62 ml  Output 250 ml  Net 1535.62 ml   Filed Weights   06/15/23 1354  Weight: 115 kg    Examination:  General exam: Appears calm and comfortable  Respiratory system: Clear to auscultation. Respiratory effort normal. Cardiovascular system: S1 & S2 heard, RRR. No JVD, murmurs, rubs, gallops or clicks. No pedal edema. Gastrointestinal system: Abdomen is nondistended, soft and nontender. No organomegaly or masses felt. Normal bowel sounds heard. Central nervous system: Alert and  Extremities: Symmetric 5 x 5 power.    Data Reviewed: I have personally reviewed following labs and imaging studies  CBC: Recent Labs  Lab 06/14/23 1735 06/15/23 1413  WBC 12.8* 13.5*  HGB 14.4 13.9  HCT 44.2 42.2  MCV 86.3 86.3  PLT 261 255   Basic Metabolic Panel: Recent Labs  Lab 06/14/23 1735 06/15/23 1413  NA 138 141  K 3.5 3.5  CL 102 102  CO2 23 24  GLUCOSE 108* 102*  BUN 13 11  CREATININE 0.79  0.77  CALCIUM 9.4 9.3   GFR: Estimated Creatinine Clearance: 96.5 mL/min (by C-G formula based on SCr of 0.77 mg/dL). Liver Function Tests: Recent Labs  Lab 06/14/23 1735 06/15/23 1413  AST 19 22  ALT 15 17  ALKPHOS 110 105  BILITOT 0.6 1.0  PROT 8.1 8.1  ALBUMIN 4.8 4.8   Recent Labs  Lab 06/14/23 1735 06/15/23 1413  LIPASE 89* 75*   No results for input(s): "AMMONIA" in the last 168 hours. Coagulation Profile: No results for input(s): "INR", "PROTIME" in the last 168 hours. Cardiac Enzymes: No results for input(s): "CKTOTAL", "CKMB", "CKMBINDEX", "TROPONINI" in the last 168 hours. BNP (last 3 results) No results for input(s): "PROBNP" in the last 8760 hours. HbA1C: No results for input(s): "HGBA1C" in the last 72 hours. CBG: Recent Labs  Lab 06/14/23 1657  GLUCAP 108*   Lipid Profile: No results for input(s): "CHOL", "HDL", "LDLCALC", "TRIG", "CHOLHDL", "LDLDIRECT" in the last 72 hours. Thyroid Function Tests: No results for input(s): "TSH", "T4TOTAL", "FREET4", "T3FREE", "THYROIDAB" in the last 72 hours. Anemia Panel: No results for input(s): "VITAMINB12", "FOLATE", "FERRITIN", "TIBC", "IRON", "RETICCTPCT" in the last 72 hours. Sepsis Labs: No results for input(s): "PROCALCITON", "LATICACIDVEN" in the last 168 hours.  No results found for this or any previous visit (from the past 240 hour(s)).       Radiology Studies: DG CHEST PORT 1 VIEW  Result Date: 06/15/2023 CLINICAL DATA:  Shortness of breath EXAM: PORTABLE CHEST 1 VIEW COMPARISON:  07/30/2021 FINDINGS: Lungs are clear.  No pleural effusion or pneumothorax. The heart is normal in size. Degenerative changes of the bilateral shoulders. IMPRESSION: No acute cardiopulmonary disease. Electronically Signed   By: Charline Bills M.D.   On: 06/15/2023 21:12   CT ABDOMEN PELVIS W CONTRAST  Result Date: 06/14/2023 CLINICAL DATA:  Abdominal pain, nausea and vomiting EXAM: CT ABDOMEN AND PELVIS WITH CONTRAST  TECHNIQUE: Multidetector CT imaging of the abdomen and pelvis was performed using the standard protocol following bolus administration of intravenous contrast. RADIATION DOSE REDUCTION: This exam was performed according to the departmental dose-optimization program which includes automated exposure control, adjustment of the mA and/or kV according to patient size and/or use of iterative reconstruction technique. CONTRAST:  OMNIPAQUE IOHEXOL 300 MG/ML  SOLN COMPARISON:  12/21/2021 FINDINGS: Lower chest: No acute pleural or parenchymal lung disease. Hepatobiliary: No focal liver abnormality is seen. Status post cholecystectomy. No biliary dilatation. Pancreas: Unremarkable. No pancreatic ductal dilatation or surrounding inflammatory changes. Spleen: Normal in size without focal abnormality. Adrenals/Urinary Tract: Adrenal glands are unremarkable. Stable appearance of the kidneys. No nephrolithiasis or obstructive uropathy. Bladder is unremarkable. Stomach/Bowel: No bowel obstruction or ileus. Normal appendix right lower quadrant. No bowel wall thickening or inflammatory change. Vascular/Lymphatic: Aortic atherosclerosis. No enlarged abdominal or pelvic lymph nodes. Reproductive: Uterus and bilateral adnexa are unremarkable. Other: No free fluid or free intraperitoneal gas. No abdominal wall hernia. Musculoskeletal: No acute or destructive bony abnormalities. Bilateral L5 spondylolysis  with stable grade 2 anterolisthesis of L5 on S1. Severe L5-S1 spondylosis unchanged. Reconstructed images demonstrate no additional findings. IMPRESSION: 1. No acute intra-abdominal or intrapelvic process. 2.  Aortic Atherosclerosis (ICD10-I70.0). Electronically Signed   By: Sharlet Salina M.D.   On: 06/14/2023 23:41        Scheduled Meds:  busPIRone  10 mg Oral BID   cariprazine  6 mg Oral Daily   enoxaparin (LOVENOX) injection  40 mg Subcutaneous Q24H   metoprolol tartrate  50 mg Oral BID   pantoprazole  40 mg Oral  Daily   tapentadol  200 mg Oral Q12H   And   tapentadol  50 mg Oral Q12H   tapentadol  100 mg Oral Q1400   vortioxetine HBr  20 mg Oral Daily   Continuous Infusions:  sodium chloride 75 mL/hr at 06/15/23 1944   promethazine (PHENERGAN) injection (IM or IVPB) 12.5 mg (06/16/23 0126)     LOS: 0 days    Time spent: 35 minutes    Jolane Bankhead A Hurley Sobel, MD [facility]   If 7PM-7AM, please contact night-coverage [email or web address]  06/16/2023, 7:38 AM

## 2023-06-16 NOTE — Progress Notes (Signed)
NP Holly Roberts secured [employer] x2 because is asking for medication for her restless legs.

## 2023-06-16 NOTE — Consult Note (Signed)
[facility] Face-to-Face Psychiatry Consult   Reason for Consult:'' Patient on multiples Psych medication, not tolerationg oral. Needs assistance with medications. Any IV meds available. thanks.'' Referring Physician:  Hartley Barefoot, MD Patient Identification: Holly Roberts MRN:  [id number] Principal Diagnosis: N&V (nausea and vomiting) Diagnosis:  Principal Problem:   N&V (nausea and vomiting) Active Problems:   Bipolar disorder (HCC)   Total Time spent with patient: 1 hour  Subjective:   Holly Roberts is a 57 y.o. female patient admitted with abdomina pain, nausea, vomiting and chronic diarreha.  HPI:  57 year old female with a history of hypertension, chronic pain on Nucynta, Bipolar disorder, Anxiety and prior cholecystectomy who presented to the hospital with intractable vomiting, nausea, abdominal pain and diarrhea. Psychiatric service was consulted due to patient not tolerating oral medications and provider requesting for IV medications. Patient reports that she past took her psych medications about 5 days ago, she is now fidgety, restless, apprehensive but not agitated, delusional, psychotic or suicidal.Patient agreed to take IM medications for now until vomiting subsided.  Past Psychiatric History: as above  Risk to Self:  denies Risk to Others:  denies Prior Inpatient Therapy:  denies Prior Outpatient Therapy:  yes  Past Medical History:  Past Medical History:  Diagnosis Date   Anxiety    Arthritis    Bipolar disorder (HCC)    Chronic pain    Depression    GERD (gastroesophageal reflux disease)    Hypertension    patient denies    Past Surgical History:  Procedure Laterality Date   CHOLECYSTECTOMY N/A 04/20/2021   Procedure: LAPAROSCOPIC CHOLECYSTECTOMY WITH INTRAOPERATIVE CHOLANGIOGRAM;  Surgeon: [name], MD;  Location: [facility];  Service: General;  Laterality: N/A;   Right knee surgery     TONSILLECTOMY     Family History:  Family History  Problem Relation Age  of Onset   Diverticulitis Mother    Autoimmune disease Mother        heptatic   Heart attack Mother        x2   Cancer Father    Pancreatic cancer Maternal Grandmother    Ulcerative colitis Maternal Grandfather    Colon cancer Neg Hx    Esophageal cancer Neg Hx    Family Psychiatric  History:  Social History:  Social History   Substance and Sexual Activity  Alcohol Use Yes   Comment: rarely     Social History   Substance and Sexual Activity  Drug Use Never    Social History   Socioeconomic History   Marital status: Single    Spouse name: Not on file   Number of children: Not on file   Years of education: Not on file   Highest education level: Not on file  Occupational History   Not on file  Tobacco Use   Smoking status: Never   Smokeless tobacco: Never  Vaping Use   Vaping Use: Some days   Substances: CBD  Substance and Sexual Activity   Alcohol use: Yes    Comment: rarely   Drug use: Never   Sexual activity: Not Currently  Other Topics Concern   Not on file  Social History Narrative   Not on file   Social Determinants of Health   Financial Resource Strain: Not on file  Food Insecurity: Not on file  Transportation Needs: Not on file  Physical Activity: Not on file  Stress: Not on file  Social Connections: Not on file   Additional Social History:    Allergies:  Allergies  Allergen Reactions   Buprenorphine Rash    Patch gave rash- poss adhesive issues. Tried generic and brand name both. Patient states: "I am not allergic to the medication. I am allergic to the adhesive, not the medication."    Amoxicillin-Pot Clavulanate     Other reaction(s): Other (See Comments) States "strong" antibiotics cause C diff   Wound Dressing Adhesive     Rash   Sulfa Antibiotics Rash    Labs:  Results for orders placed or performed during the hospital encounter of 06/15/23 (from the past 48 hour(s))  Lipase, blood     Status: Abnormal   Collection Time:  06/15/23  2:13 PM  Result Value Ref Range   Lipase 75 (H) 11 - 51 U/L    Comment: Performed at [facility], [address], [place], [place] [address]  Comprehensive metabolic panel     Status: Abnormal   Collection Time: 06/15/23  2:13 PM  Result Value Ref Range   Sodium 141 135 - 145 mmol/L   Potassium 3.5 3.5 - 5.1 mmol/L   Chloride 102 98 - 111 mmol/L   CO2 24 22 - 32 mmol/L   Glucose, Bld 102 (H) 70 - 99 mg/dL    Comment: Glucose reference range applies only to samples taken after fasting for at least 8 hours.   BUN 11 6 - 20 mg/dL   Creatinine, Ser 8.29 0.44 - 1.00 mg/dL   Calcium 9.3 8.9 - 56.2 mg/dL   Total Protein 8.1 6.5 - 8.1 g/dL   Albumin 4.8 3.5 - 5.0 g/dL   AST 22 15 - 41 U/L   ALT 17 0 - 44 U/L   Alkaline Phosphatase 105 38 - 126 U/L   Total Bilirubin 1.0 0.3 - 1.2 mg/dL   GFR, Estimated >13 >08 mL/min    Comment: (NOTE) Calculated using the CKD-EPI Creatinine Equation (2021)    Anion gap 15 5 - 15    Comment: Performed at [facility], [address] [place], [place] [address]  CBC     Status: Abnormal   Collection Time: 06/15/23  2:13 PM  Result Value Ref Range   WBC 13.5 (H) 4.0 - 10.5 K/uL   RBC 4.89 3.87 - 5.11 MIL/uL   Hemoglobin 13.9 12.0 - 15.0 g/dL   HCT 69.6 29.5 - 28.4 %   MCV 86.3 80.0 - 100.0 fL   MCH 28.4 26.0 - 34.0 pg   MCHC 32.9 30.0 - 36.0 g/dL   RDW 13.2 44.0 - 10.2 %   Platelets 255 150 - 400 K/uL   nRBC 0.0 0.0 - 0.2 %    Comment: Performed at [facility], [address], [place], [place] [address]  Urinalysis, Routine w reflex microscopic -Urine, Clean Catch     Status: Abnormal   Collection Time: 06/15/23  8:03 PM  Result Value Ref Range   Color, Urine YELLOW YELLOW   APPearance HAZY (A) CLEAR   Specific Gravity, Urine 1.018 1.005 - 1.030   pH 5.0 5.0 - 8.0   Glucose, UA NEGATIVE NEGATIVE mg/dL   Hgb urine dipstick MODERATE (A) NEGATIVE   Bilirubin Urine NEGATIVE  NEGATIVE   Ketones, ur 20 (A) NEGATIVE mg/dL   Protein, ur 30 (A) NEGATIVE mg/dL   Nitrite NEGATIVE NEGATIVE   Leukocytes,Ua MODERATE (A) NEGATIVE   RBC / HPF 0-5 0 - 5 RBC/hpf   WBC, UA 21-50 0 - 5 WBC/hpf   Bacteria, UA MANY (  A) NONE SEEN   Squamous Epithelial / HPF 0-5 0 - 5 /HPF   Mucus PRESENT     Comment: Performed at [facility], [address], [place], [place] [address]  C Difficile Quick Screen w PCR reflex     Status: None   Collection Time: 06/16/23  7:11 AM   Specimen: Stool  Result Value Ref Range   C Diff antigen NEGATIVE NEGATIVE   C Diff toxin NEGATIVE NEGATIVE   C Diff interpretation No C. difficile detected.     Comment: Performed at [facility], [address] [place], [place] [address]  CBC     Status: None   Collection Time: 06/16/23  9:21 AM  Result Value Ref Range   WBC 10.5 4.0 - 10.5 K/uL   RBC 4.78 3.87 - 5.11 MIL/uL   Hemoglobin 13.6 12.0 - 15.0 g/dL   HCT 65.7 84.6 - 96.2 %   MCV 87.4 80.0 - 100.0 fL   MCH 28.5 26.0 - 34.0 pg   MCHC 32.5 30.0 - 36.0 g/dL   RDW 95.2 84.1 - 32.4 %   Platelets 237 150 - 400 K/uL   nRBC 0.0 0.0 - 0.2 %    Comment: Performed at [facility], [address], [place], [place] [address]  Comprehensive metabolic panel     Status: Abnormal   Collection Time: 06/16/23  9:21 AM  Result Value Ref Range   Sodium 139 135 - 145 mmol/L   Potassium 3.3 (L) 3.5 - 5.1 mmol/L   Chloride 102 98 - 111 mmol/L   CO2 23 22 - 32 mmol/L   Glucose, Bld 82 70 - 99 mg/dL    Comment: Glucose reference range applies only to samples taken after fasting for at least 8 hours.   BUN 10 6 - 20 mg/dL   Creatinine, Ser 7.25 0.44 - 1.00 mg/dL   Calcium 9.2 8.9 - 36.6 mg/dL   Total Protein 8.0 6.5 - 8.1 g/dL   Albumin 4.7 3.5 - 5.0 g/dL   AST 31 15 - 41 U/L   ALT 24 0 - 44 U/L   Alkaline Phosphatase 102 38 - 126 U/L   Total Bilirubin 1.2 0.3 - 1.2 mg/dL   GFR, Estimated >44 >03 mL/min     Comment: (NOTE) Calculated using the CKD-EPI Creatinine Equation (2021)    Anion gap 14 5 - 15    Comment: Performed at [facility], [address] [place], [place] [address]    Current Facility-Administered Medications  Medication Dose Route Frequency Provider Last Rate Last Admin   0.9 %  sodium chloride infusion   Intravenous Continuous Regalado, Belkys A, MD 100 mL/hr at 06/16/23 1016 Rate Change at 06/16/23 1016   Armodafinil 250 mg  250 mg Oral q morning Regalado, Belkys A, MD       busPIRone (BUSPAR) tablet 10 mg  10 mg Oral BID [name], MD       cariprazine (VRAYLAR) capsule 6 mg  6 mg Oral Daily [name], MD       cefTRIAXone (ROCEPHIN) 2 g in sodium chloride 0.9 % 100 mL IVPB  2 g Intravenous Q24H Regalado, Belkys A, MD 200 mL/hr at 06/16/23 1218 Restarted at 06/16/23 1218   diphenhydrAMINE (BENADRYL) injection 25 mg  25 mg Intramuscular Q6H PRN Marzell Isakson, MD       enoxaparin (LOVENOX) injection 40 mg  40 mg Subcutaneous Q24H [name], MD  40 mg at 06/15/23 2206   gabapentin (NEURONTIN) capsule 1,200 mg  1,200 mg Oral QHS Regalado, Belkys A, MD       gabapentin (NEURONTIN) capsule 600 mg  600 mg Oral Daily Regalado, Belkys A, MD       HYDROmorphone (DILAUDID) injection 0.5 mg  0.5 mg Intravenous Q4H PRN Regalado, Belkys A, MD       metoprolol tartrate (LOPRESSOR) injection 5 mg  5 mg Intravenous Q8H PRN [name], MD       metoprolol tartrate (LOPRESSOR) tablet 50 mg  50 mg Oral BID [name], MD       naloxone [facility]) injection 0.4 mg  0.4 mg Intravenous PRN [name], MD       pantoprazole (PROTONIX) injection 40 mg  40 mg Intravenous Q12H Regalado, Belkys A, MD   40 mg at 06/16/23 1012   potassium chloride 10 mEq in 100 mL IVPB  10 mEq Intravenous Q1 Hr x 3 Regalado, Belkys A, MD       promethazine (PHENERGAN) 12.5 mg in sodium chloride 0.9 % 50 mL IVPB  12.5 mg Intravenous Q6H PRN [name], MD 200 mL/hr at 06/16/23 0810 12.5 mg at 06/16/23 0810    tapentadol (NUCYNTA) 12 hr tablet 200 mg  200 mg Oral Q12H [name], MD       And   tapentadol (NUCYNTA) 12 hr tablet 50 mg  50 mg Oral Q12H [name], MD       tapentadol (NUCYNTA) tablet 100 mg  100 mg Oral Q1400 [name], MD       vortioxetine HBr (TRINTELLIX) tablet 20 mg  20 mg Oral Daily [name], MD       ziprasidone (GEODON) injection 10 mg  10 mg Intramuscular Q6H PRN Thedore Mins, MD        Musculoskeletal: Strength & Muscle Tone: within normal limits Gait & Station:  Not tested Patient leans: N/A    Psychiatric Specialty Exam:  Presentation  General Appearance:  Appropriate for Environment  Eye Contact: Minimal  Speech: Clear and Coherent  Speech Volume: Decreased  Handedness: Right   Mood and Affect  Mood: Dysphoric  Affect: Appropriate   Thought Process  Thought Processes: Linear  Descriptions of Associations:Intact  Orientation:Full (Time, Place and Person)  Thought Content:Logical  History of Schizophrenia/Schizoaffective disorder:No data recorded Duration of Psychotic Symptoms:No data recorded Hallucinations:Hallucinations: None  Ideas of Reference:None  Suicidal Thoughts:Suicidal Thoughts: No  Homicidal Thoughts:Homicidal Thoughts: No   Sensorium  Memory: Immediate Fair; Recent Fair; Remote Fair  Judgment: Intact  Insight: Fair   Art therapist  Concentration: Fair  Attention Span: Fair  Recall: [employer] of Knowledge: Fair  Language: Good   Psychomotor Activity  Psychomotor Activity: Psychomotor Activity: Restlessness   Assets  Assets: Desire for Improvement   Sleep  Sleep: Sleep: Fair   Physical Exam: Physical Exam Review of Systems  Psychiatric/Behavioral:  Negative for depression, hallucinations, memory loss, substance abuse and suicidal ideas. The patient is nervous/anxious.    Blood pressure (!) 160/99, pulse (!) 107, temperature 98.1 F (36.7 C), temperature source Oral,  resp. rate 18, height 5\' 4"  (1.626 m), weight 115 kg, SpO2 95 %. Body mass index is 43.52 kg/m.  Treatment Plan Summary: 57 y/o female with history of Bipolar disorder, S/P Cholecystectomy who was admitted due to nausea, diarrhea, vomiting and intractable nausea. As a result, she is unable to tolerate oral medications. Patient agreed to IM medications for the next few days.  Plan/Recommendations: -Start pt on Geodon 10 mg IM Q6h prn as needed for agitated mood-not to exceed 40 mg daily x72 hrs -Add Benadryl 25 mg Q6h IM prn as needed for EPS prevention x 72 hrs -Treat the underlying cause for nausea, vomiting and diarrhea -Resume home psychiatric medications once Nausea and vomiting stop -Consider surgery referral for evaluation to rule out intestinal obstruction in a patient S/P Cholecystectomy  Disposition: No evidence of imminent psychiatric risk to self or others at present.   Supportive therapy provided about ongoing stressors. Psychiatric service signing out. Re-consult as needed  Thedore Mins, MD 06/16/2023 12:43 PM

## 2023-06-17 DIAGNOSIS — R197 Diarrhea, unspecified: Secondary | ICD-10-CM | POA: Diagnosis not present

## 2023-06-17 DIAGNOSIS — R112 Nausea with vomiting, unspecified: Secondary | ICD-10-CM | POA: Diagnosis not present

## 2023-06-17 LAB — GASTROINTESTINAL PANEL BY PCR, STOOL (REPLACES STOOL CULTURE)

## 2023-06-17 LAB — URINE CULTURE

## 2023-06-17 MED ORDER — ENOXAPARIN SODIUM 60 MG/0.6ML IJ SOSY
60.0000 mg | PREFILLED_SYRINGE | INTRAMUSCULAR | Status: DC
  Administered 2023-06-17 – 2023-06-20 (×4): 60 mg via SUBCUTANEOUS
  Filled 2023-06-17 (×4): qty 0.6

## 2023-06-17 MED ORDER — POTASSIUM CHLORIDE 10 MEQ/100ML IV SOLN
10.0000 meq | INTRAVENOUS | Status: AC
  Administered 2023-06-17 (×4): 10 meq via INTRAVENOUS
  Filled 2023-06-17 (×3): qty 100

## 2023-06-17 MED ORDER — ONDANSETRON HCL 4 MG/2ML IJ SOLN
4.0000 mg | Freq: Four times a day (QID) | INTRAMUSCULAR | Status: DC | PRN
  Administered 2023-06-18 – 2023-06-21 (×6): 4 mg via INTRAVENOUS
  Filled 2023-06-17 (×6): qty 2

## 2023-06-17 MED ORDER — LORAZEPAM 2 MG/ML IJ SOLN
0.5000 mg | Freq: Three times a day (TID) | INTRAMUSCULAR | Status: DC | PRN
  Administered 2023-06-17 – 2023-06-19 (×2): 0.5 mg via INTRAVENOUS
  Filled 2023-06-17 (×2): qty 1

## 2023-06-17 NOTE — TOC Progression Note (Signed)
Transition of Care [facility]) - Progression Note    Patient Details  Name: Avalin Denzler MRN: [id number] Date of Birth: 04-Jul-1966  Transition of Care [facility]) CM/SW Contact  Coralyn Helling, [place] Phone Number: 06/17/2023, 12:49 PM  Clinical Narrative:     No needs identified at time of screening.        Expected Discharge Plan and Services                                               Social Determinants of Health (SDOH) Interventions SDOH Screenings   Depression (PHQ2-9): High Risk (03/13/2023)  Tobacco Use: Low Risk  (06/14/2023)    Readmission Risk Interventions     No data to display

## 2023-06-17 NOTE — Progress Notes (Signed)
PROGRESS NOTE    Holly Roberts  ONG:[id number] DOB: 07/09/1966 DOA: 06/15/2023 PCP: Ivonne Andrew, NP   Brief Narrative: 57 year old with past medical history significant for bipolar disorder, anxiety, depression, primary narcolepsy catalepsy, chronic pain syndrome, chronic diarrhea since she had cholecystectomy, Presented with Nausea Vomiting Abdominal Pain.  CT Scan Was Negative.  She was Initially Evaluated in the ED and was Discharged Home She Presented 6 Hours after with Persistent Symptoms.     Assessment & Plan:   Principal Problem:   N&V (nausea and vomiting) Active Problems:   Bipolar disorder (HCC)  1-Nausea vomiting and abdominal pain: Secondary to Gastroenteritis E coli and Norovirus.  -CT abdomen and pelvis no acute finding. -Continue with IV fluids. -C diff negative. -IV PPI -PRN Zofran.  -PRN dilaudid  -Continue with support care   UTI; UA with 21--50 WBC  IV ceftriaxone.  Follow urine culture.   Leukocytosis: Normalized.   Hypertension: on Metoprolol.  IV metoprolol/   Chronic pain syndrome: Continue with Nucynta. Refuse oral meds.  PRN Dilaudid.   Bipolar disorder, anxiety, depression, primary narcolepsy with cataplexy, chronic pain syndrome: Unable to take  oral meds.  Askin for ativan for restless syndrome. Will order ativan PRN Psych consulted to assist with meds. Started on Geodon and benadryl PRN until she can tolerates oral meds.       See wound care documentation below.  Pressure Injury 07/30/21 Sacrum Mid Stage 2 -  Partial thickness loss of dermis presenting as a shallow open injury with a red, pink wound bed without slough. redness (nonblanchable), open area of skin (Active)  07/30/21 1110  Location: Sacrum  Location Orientation: Mid  Staging: Stage 2 -  Partial thickness loss of dermis presenting as a shallow open injury with a red, pink wound bed without slough.  Wound Description (Comments): redness (nonblanchable), open area of  skin  Present on Admission: Yes     Estimated body mass index is 43.52 kg/m as calculated from the following:   Height as of this encounter: 5\' 4"  (1.626 m).   Weight as of this encounter: 115 kg.   DVT prophylaxis: Lovenox Code Status: Full code Family Communication:Care discussed with daughter Disposition Plan:  Status is: Observation The patient remains OBS appropriate and will d/c before 2 midnights.    Consultants:  Psych   Procedures:    Antimicrobials:    Subjective: She seems more calm today. Still having nausea and abdominal pain.  Doesn't feel she can take oral meds.   Objective: Vitals:   06/16/23 1803 06/16/23 2147 06/17/23 0601 06/17/23 1230  BP: (!) 160/88 (!) 146/87 (!) 152/82 (!) 153/81  Pulse: (!) 102 94 (!) 104 (!) 103  Resp: 16 20 18    Temp: 98.4 F (36.9 C) 98.5 F (36.9 C) 98.2 F (36.8 C)   TempSrc: Oral Oral Oral   SpO2: 100% 99% 99% 100%  Weight:      Height:        Intake/Output Summary (Last 24 hours) at 06/17/2023 1240 Last data filed at 06/17/2023 1000 Gross per 24 hour  Intake 2067.04 ml  Output 1150 ml  Net 917.04 ml    Filed Weights   06/15/23 1354  Weight: 115 kg    Examination:  General exam: NAD Respiratory system: CTA Cardiovascular system: S 1, S 2 RRR Gastrointestinal system: BS Present, soft, nt Central nervous system: Alert, answer questions.  Extremities: No edema    Data Reviewed: I have personally reviewed following labs and imaging  studies  CBC: Recent Labs  Lab 06/14/23 1735 06/15/23 1413 06/16/23 0921  WBC 12.8* 13.5* 10.5  HGB 14.4 13.9 13.6  HCT 44.2 42.2 41.8  MCV 86.3 86.3 87.4  PLT 261 255 237    Basic Metabolic Panel: Recent Labs  Lab 06/14/23 1735 06/15/23 1413 06/16/23 0921  NA 138 141 139  K 3.5 3.5 3.3*  CL 102 102 102  CO2 23 24 23   GLUCOSE 108* 102* 82  BUN 13 11 10   CREATININE 0.79 0.77 0.79  CALCIUM 9.4 9.3 9.2    GFR: Estimated Creatinine Clearance: 96.5  mL/min (by C-G formula based on SCr of 0.79 mg/dL). Liver Function Tests: Recent Labs  Lab 06/14/23 1735 06/15/23 1413 06/16/23 0921  AST 19 22 31   ALT 15 17 24   ALKPHOS 110 105 102  BILITOT 0.6 1.0 1.2  PROT 8.1 8.1 8.0  ALBUMIN 4.8 4.8 4.7    Recent Labs  Lab 06/14/23 1735 06/15/23 1413  LIPASE 89* 75*    No results for input(s): "AMMONIA" in the last 168 hours. Coagulation Profile: No results for input(s): "INR", "PROTIME" in the last 168 hours. Cardiac Enzymes: No results for input(s): "CKTOTAL", "CKMB", "CKMBINDEX", "TROPONINI" in the last 168 hours. BNP (last 3 results) No results for input(s): "PROBNP" in the last 8760 hours. HbA1C: No results for input(s): "HGBA1C" in the last 72 hours. CBG: Recent Labs  Lab 06/14/23 1657  GLUCAP 108*    Lipid Profile: No results for input(s): "CHOL", "HDL", "LDLCALC", "TRIG", "CHOLHDL", "LDLDIRECT" in the last 72 hours. Thyroid Function Tests: No results for input(s): "TSH", "T4TOTAL", "FREET4", "T3FREE", "THYROIDAB" in the last 72 hours. Anemia Panel: No results for input(s): "VITAMINB12", "FOLATE", "FERRITIN", "TIBC", "IRON", "RETICCTPCT" in the last 72 hours. Sepsis Labs: No results for input(s): "PROCALCITON", "LATICACIDVEN" in the last 168 hours.  Recent Results (from the past 240 hour(s))  Urine Culture (for pregnant, neutropenic or urologic patients or patients with an indwelling urinary catheter)     Status: Abnormal (Preliminary result)   Collection Time: 06/15/23  9:45 AM   Specimen: Urine, Clean Catch  Result Value Ref Range Status   Specimen Description   Final    URINE, CLEAN CATCH Performed at [facility], [address] [place], [place] [address]    Special Requests   Final    NONE Performed at [facility], [address], [place], [place] [address]    Culture >=100,000 COLONIES/mL GRAM NEGATIVE RODS (A)  Final   Report Status PENDING  Incomplete   Gastrointestinal Panel by PCR , Stool     Status: Abnormal   Collection Time: 06/16/23  7:11 AM   Specimen: Stool  Result Value Ref Range Status   Campylobacter species NOT DETECTED NOT DETECTED Final   Plesimonas shigelloides NOT DETECTED NOT DETECTED Final   Salmonella species NOT DETECTED NOT DETECTED Final   Yersinia enterocolitica NOT DETECTED NOT DETECTED Final   Vibrio species NOT DETECTED NOT DETECTED Final   Vibrio cholerae NOT DETECTED NOT DETECTED Final   Enteroaggregative E coli (EAEC) NOT DETECTED NOT DETECTED Final   Enteropathogenic E coli (EPEC) DETECTED (A) NOT DETECTED Final    Comment: RESULT CALLED TO, READ BACK BY AND VERIFIED WITH:  CRISTIE BLUN AT 714 06/17/23 JG    Enterotoxigenic E coli (ETEC) NOT DETECTED NOT DETECTED Final   Shiga like toxin producing E coli (STEC) NOT DETECTED NOT DETECTED Final   Shigella/Enteroinvasive E coli (EIEC) NOT DETECTED NOT DETECTED  Final   Cryptosporidium NOT DETECTED NOT DETECTED Final   Cyclospora cayetanensis NOT DETECTED NOT DETECTED Final   Entamoeba histolytica NOT DETECTED NOT DETECTED Final   Giardia lamblia NOT DETECTED NOT DETECTED Final   Adenovirus F40/41 NOT DETECTED NOT DETECTED Final   Astrovirus NOT DETECTED NOT DETECTED Final   Norovirus GI/GII DETECTED (A) NOT DETECTED Final    Comment: RESULT CALLED TO, READ BACK BY AND VERIFIED WITH:  CRISTIE BLUN AT 714 06/17/23 JG    Rotavirus A NOT DETECTED NOT DETECTED Final   Sapovirus (I, II, IV, and V) NOT DETECTED NOT DETECTED Final    Comment: Performed at [facility], [address] [place], [place] [address]  C Difficile Quick Screen w PCR reflex     Status: None   Collection Time: 06/16/23  7:11 AM   Specimen: Stool  Result Value Ref Range Status   C Diff antigen NEGATIVE NEGATIVE Final   C Diff toxin NEGATIVE NEGATIVE Final   C Diff interpretation No C. difficile detected.  Final    Comment: Performed at [facility], [address] [place], [place] [address]         Radiology Studies: DG CHEST PORT 1 VIEW  Result Date: 06/15/2023 CLINICAL DATA:  Shortness of breath EXAM: PORTABLE CHEST 1 VIEW COMPARISON:  07/30/2021 FINDINGS: Lungs are clear.  No pleural effusion or pneumothorax. The heart is normal in size. Degenerative changes of the bilateral shoulders. IMPRESSION: No acute cardiopulmonary disease. Electronically Signed   By: Charline Bills M.D.   On: 06/15/2023 21:12        Scheduled Meds:  Armodafinil  250 mg Oral q morning   busPIRone  10 mg Oral BID   cariprazine  6 mg Oral Daily   enoxaparin (LOVENOX) injection  60 mg Subcutaneous Q24H   gabapentin  1,200 mg Oral QHS   gabapentin  600 mg Oral Daily   metoprolol tartrate  50 mg Oral BID   nystatin   Topical TID   pantoprazole (PROTONIX) IV  40 mg Intravenous Q12H   tapentadol  200 mg Oral Q12H   And   tapentadol  50 mg Oral Q12H   tapentadol  100 mg Oral Q1400   vortioxetine HBr  20 mg Oral Daily   Continuous Infusions:  cefTRIAXone (ROCEPHIN)  IV 2 g (06/17/23 1001)   promethazine (PHENERGAN) injection (IM or IVPB) 12.5 mg (06/17/23 0641)     LOS: 1 day    Time spent: 35 minutes    Haddy Mullinax A Shaylie Eklund, MD [facility]   If 7PM-7AM, please contact night-coverage [email or web address]  06/17/2023, 12:40 PM

## 2023-06-18 LAB — BASIC METABOLIC PANEL
Anion gap: 17 — ABNORMAL HIGH (ref 5–15)
BUN: 5 mg/dL — ABNORMAL LOW (ref 6–20)
CO2: 18 mmol/L — ABNORMAL LOW (ref 22–32)
Calcium: 8.9 mg/dL (ref 8.9–10.3)
Chloride: 101 mmol/L (ref 98–111)
Creatinine, Ser: 0.73 mg/dL (ref 0.44–1.00)
GFR, Estimated: >60 mL/min (ref >60)
Glucose, Bld: 84 mg/dL (ref 70–99)
Potassium: 3.4 mmol/L — ABNORMAL LOW (ref 3.5–5.1)
Sodium: 136 mmol/L (ref 135–145)

## 2023-06-18 LAB — URINE CULTURE: Culture: >=100000 — AB

## 2023-06-18 LAB — CBC
HCT: 42.5 % (ref 36.0–46.0)
Hemoglobin: 13.7 g/dL (ref 12.0–15.0)
MCH: 28.4 pg (ref 26.0–34.0)
MCHC: 32.2 g/dL (ref 30.0–36.0)
MCV: 88.2 fL (ref 80.0–100.0)
Platelets: 236 10*3/uL (ref 150–400)
RBC: 4.82 MIL/uL (ref 3.87–5.11)
RDW: 13 % (ref 11.5–15.5)
WBC: 8.5 10*3/uL (ref 4.0–10.5)
nRBC: 0 % (ref 0.0–0.2)

## 2023-06-18 LAB — MAGNESIUM: Magnesium: 2 mg/dL (ref 1.7–2.4)

## 2023-06-18 MED ORDER — POTASSIUM CHLORIDE 10 MEQ/100ML IV SOLN
10.0000 meq | INTRAVENOUS | Status: AC
  Administered 2023-06-18 (×4): 10 meq via INTRAVENOUS
  Filled 2023-06-18: qty 100

## 2023-06-18 MED ORDER — SODIUM CHLORIDE 0.9 % IV SOLN: INTRAVENOUS | Status: DC

## 2023-06-18 NOTE — Evaluation (Signed)
Physical Therapy Evaluation Patient Details Name: Holly Roberts MRN: [id number] DOB: 1966/06/09 Today's Date: 06/18/2023  History of Present Illness  Patient is a 57 year old female who presented on 6/29 with abdominal pain, nausea and vomiting. Patient was admitted with gastroenteritis E coli and Norovirus, UTI. PMH: bipolar disorder, anxiety, depression.  Clinical Impression  Patient evaluated by Physical Therapy with no further acute PT needs identified. All education has been completed and the patient has no further questions.  Pt is at or very near her baseline mobility, no f/u PT indicated at this time; will benefit from incr mobility/amb with nursing staff and mobility team to improved activity tolerance. If pt status should change please re-order PT.   See below for any follow-up Physical Therapy or equipment needs. PT is signing off. Thank you for this referral.         Assistance Recommended at Discharge PRN  If plan is discharge home, recommend the following:  Can travel by private vehicle  Help with stairs or ramp for entrance;Assist for transportation        Equipment Recommendations None recommended by PT  Recommendations for Other Services       Functional Status Assessment Patient has had a recent decline in their functional status and demonstrates the ability to make significant improvements in function in a reasonable and predictable amount of time.     Precautions / Restrictions Precautions Precautions: Fall Restrictions Weight Bearing Restrictions: No      Mobility  Bed Mobility               General bed mobility comments: OOB with OT, mod I    Transfers Overall transfer level: Modified independent                      Ambulation/Gait Ambulation/Gait assistance: Supervision, Modified independent (Device/Increase time) Gait Distance (Feet): 60 Feet Assistive device: Rolling walker (2 wheels) Gait Pattern/deviations: Step-through  pattern, Decreased stride length, Wide base of support       General Gait Details: for safety only, slight fatigue, no LOB  Stairs            Wheelchair Mobility     Tilt Bed    Modified Rankin (Stroke Patients Only)       Balance Overall balance assessment: No apparent balance deficits (not formally assessed)                                           Pertinent Vitals/Pain      Home Living Family/patient expects to be discharged to:: Private residence Living Arrangements: Children Available Help at Discharge: Family;Available 24 hours/day Type of Home: Apartment Home Access: Stairs to enter   Entrance Stairs-Number of Steps: 3   Home Layout: One level Home Equipment: Rollator (4 wheels);Shower seat;Toilet riser      Prior Function Prior Level of Function : Needs assist             Mobility Comments: in apartment amb without AD, longer distances uses rollator or goes to stores with electric scooters ADLs Comments: daughter bathes her and assists her due to weak shoulders.patient daughter helps with cooking, cleaning and driving and LB dressing.patient indicated not being able to stand long.     Hand Dominance   Dominant Hand: Right    Extremity/Trunk Assessment   Upper Extremity Assessment Upper Extremity Assessment:  LUE deficits/detail;RUE deficits/detail RUE Deficits / Details: able to ROM shoulders to about 60 degrees with full ROM of elbows and hands. LUE Deficits / Details: able to ROM shoulders to about 60 degrees with full ROM of elbows and hands.    Lower Extremity Assessment Lower Extremity Assessment: Defer to PT evaluation    Cervical / Trunk Assessment Cervical / Trunk Assessment: Normal  Communication   Communication: No difficulties  Cognition                                                General Comments      Exercises     Assessment/Plan    PT Assessment Patient does not need  any further PT services  PT Problem List         PT Treatment Interventions      PT Goals (Current goals can be found in the Care Plan section)  Acute Rehab PT Goals PT Goal Formulation: All assessment and education complete, DC therapy    Frequency       Co-evaluation               AM-PAC PT "6 Clicks" Mobility  Outcome Measure Help needed turning from your back to your side while in a flat bed without using bedrails?: None Help needed moving from lying on your back to sitting on the side of a flat bed without using bedrails?: None Help needed moving to and from a bed to a chair (including a wheelchair)?: None Help needed standing up from a chair using your arms (e.g., wheelchair or bedside chair)?: None Help needed to walk in hospital room?: None Help needed climbing 3-5 steps with a railing? : A Little 6 Click Score: 23    End of Session   Activity Tolerance: Patient tolerated treatment well Patient left: in chair;with call bell/phone within reach   PT Visit Diagnosis: Difficulty in walking, not elsewhere classified (R26.2)    Time: 4401-0272 PT Time Calculation (min) (ACUTE ONLY): 12 min   Charges:   PT Evaluation $PT Eval Low Complexity: 1 Low   PT General Charges $$ ACUTE PT VISIT: 1 Visit         Donnald Tabar, PT  Acute Rehab Dept [facility]) [phone]  06/18/2023   [facility] 06/18/2023, 4:33 PM

## 2023-06-18 NOTE — Evaluation (Signed)
Occupational Therapy Evaluation Patient Details Name: Holly Roberts MRN: [id number] DOB: 1966-05-11 Today's Date: 06/18/2023   History of Present Illness Patient is a 57 year old female who presented on 6/29 with abdominal pain, nausea and vomiting. Patient was admitted with gastroenteritis E coli and Norovirus, UTI. PMH: bipolar disorder, anxiety, depression.   Clinical Impression   Patient evaluated by Occupational Therapy with no further acute OT needs identified. All education has been completed and the patient has no further questions. Patient is at her baseline for ADLs at this time with chronic back pain and limited ROM of BUE. Patient has daughter A at home for these tasks.  See below for any follow-up Occupational Therapy or equipment needs. OT is signing off. Thank you for this referral.       Recommendations for follow up therapy are one component of a multi-disciplinary discharge planning process, led by the attending physician.  Recommendations may be updated based on patient status, additional functional criteria and insurance authorization.   Assistance Recommended at Discharge Intermittent Supervision/Assistance  Patient can return home with the following A little help with bathing/dressing/bathroom;Direct supervision/assist for medications management;Assist for transportation;Direct supervision/assist for financial management;Help with stairs or ramp for entrance;Assistance with cooking/housework    Functional Status Assessment  Patient has had a recent decline in their functional status and demonstrates the ability to make significant improvements in function in a reasonable and predictable amount of time.  Equipment Recommendations  None recommended by OT       Precautions / Restrictions Restrictions Weight Bearing Restrictions: No      Mobility Bed Mobility Overal bed mobility: Modified Independent                  Transfers Overall transfer level: Modified  independent      Balance Overall balance assessment: No apparent balance deficits (not formally assessed)           ADL either performed or assessed with clinical judgement   ADL Overall ADL's : At baseline           General ADL Comments: patient was able to complete bed mobility, transfers, functional mobility in room and hallway, LB dressing tasks figure four sitting EOB with MI with rolling walker. patient endorsed being at baseline for ADLs and that she did not need OT at this time.     Vision Baseline Vision/History: 1 Wears glasses Vision Assessment?: No apparent visual deficits            Pertinent Vitals/Pain Pain Assessment Pain Assessment: Faces Faces Pain Scale: Hurts a little bit Pain Location: back Pain Descriptors / Indicators: Discomfort, Grimacing Pain Intervention(s): Limited activity within patient's tolerance, Monitored during session, Repositioned     Hand Dominance Right   Extremity/Trunk Assessment Upper Extremity Assessment Upper Extremity Assessment: LUE deficits/detail;RUE deficits/detail RUE Deficits / Details: able to ROM shoulders to about 60 degrees with full ROM of elbows and hands. LUE Deficits / Details: able to ROM shoulders to about 60 degrees with full ROM of elbows and hands.   Lower Extremity Assessment Lower Extremity Assessment: Defer to PT evaluation   Cervical / Trunk Assessment Cervical / Trunk Assessment: Normal   Communication Communication Communication: No difficulties   Cognition Arousal/Alertness: Awake/alert Behavior During Therapy: WFL for tasks assessed/performed Overall Cognitive Status: Within Functional Limits for tasks assessed                    Home Living Family/patient expects to be discharged to:: Private  residence Living Arrangements: Children Available Help at Discharge: Family;Available 24 hours/day Type of Home: Apartment Home Access: Stairs to enter Entrance Stairs-Number of Steps:  3   Home Layout: One level     Bathroom Shower/Tub: Tub/shower unit         Home Equipment: Rollator (4 wheels);Shower seat;Toilet riser          Prior Functioning/Environment Prior Level of Function : Needs assist             Mobility Comments: in apartment amb without AD, longer distances uses rollator or goes to stores with electric scooters ADLs Comments: daughter bathes her and assists her due to weak shoulders.patient daughter helps with cooking, cleaning and driving and LB dressing.patient indicated not being able to stand long.        OT Problem List:        OT Treatment/Interventions:      OT Goals(Current goals can be found in the care plan section) Acute Rehab OT Goals OT Goal Formulation: All assessment and education complete, DC therapy  OT Frequency:         AM-PAC OT "6 Clicks" Daily Activity     Outcome Measure Help from another person eating meals?: None Help from another person taking care of personal grooming?: None Help from another person toileting, which includes using toliet, bedpan, or urinal?: None Help from another person bathing (including washing, rinsing, drying)?: None Help from another person to put on and taking off regular upper body clothing?: None Help from another person to put on and taking off regular lower body clothing?: None 6 Click Score: 24   End of Session Equipment Utilized During Treatment: Rolling walker (2 wheels) Nurse Communication: Other (comment) (ok to participate in session)  Activity Tolerance: Patient tolerated treatment well Patient left: in chair;with call bell/phone within reach;Other (comment) (PT)  OT Visit Diagnosis: Unsteadiness on feet (R26.81)                Time: 2440-1027 OT Time Calculation (min): 26 min Charges:  OT General Charges $OT Visit: 1 Visit OT Evaluation $OT Eval Low Complexity: 1 Low OT Treatments $Self Care/Home Management : 8-22 mins  Rosalio Loud, MS Acute  Rehabilitation Department Office# [phone]   Selinda Flavin 06/18/2023, 4:27 PM

## 2023-06-18 NOTE — Progress Notes (Addendum)
PROGRESS NOTE    Holly Roberts  XBJ:[id number] DOB: 01-Apr-1966 DOA: 06/15/2023 PCP: Ivonne Andrew, NP   Brief Narrative: 57 year old with past medical history significant for bipolar disorder, anxiety, depression, primary narcolepsy catalepsy, chronic pain syndrome, chronic diarrhea since she had cholecystectomy, Presented with Nausea Vomiting Abdominal Pain.  CT Scan Was Negative.  She was Initially Evaluated in the ED and was Discharged Home She Presented 6 Hours after with Persistent Symptoms.  Patient was found to have gastroenteritis secondary to E. coli noted vitals, she was also found to have E. coli urinary tract infection.   Assessment & Plan:   Principal Problem:   N&V (nausea and vomiting) Active Problems:   Bipolar disorder (HCC)  1-Nausea vomiting and abdominal pain: Secondary to Gastroenteritis E coli and Norovirus.  -CT abdomen and pelvis no acute finding. -Continue with IV fluids. -C diff negative. GI pathogen positive for E coli and Norovirus.  -IV PPI -PRN Zofran.  -PRN dilaudid  -Continue with support care  -Plan to advance diet to soft. She report she has been trying to drink some fluids.   UTI; UA with 21--50 WBC  Received 3 days of IV ceftriaxone.  Urine culture: E coli.   Leukocytosis: Normalized.   Hypertension: on Metoprolol.  IV metoprolol/   Chronic pain syndrome: Continue with Nucynta. Refuse oral meds.  PRN Dilaudid.   Bipolar disorder, anxiety, depression, primary narcolepsy with cataplexy, chronic pain syndrome: Unable to take  oral meds.  Askin for ativan for restless syndrome. Will order ativan PRN Psych consulted to assist with meds. Started on Geodon and benadryl PRN until she can tolerates oral meds.   Candida panniculitis groin; Continue with Nystatin.  Hypokalemia; replete IV   See wound care documentation below.  Pressure Injury 07/30/21 Sacrum Mid Stage 2 -  Partial thickness loss of dermis presenting as a shallow open  injury with a red, pink wound bed without slough. redness (nonblanchable), open area of skin (Active)  07/30/21 1110  Location: Sacrum  Location Orientation: Mid  Staging: Stage 2 -  Partial thickness loss of dermis presenting as a shallow open injury with a red, pink wound bed without slough.  Wound Description (Comments): redness (nonblanchable), open area of skin  Present on Admission: Yes     Estimated body mass index is 43.52 kg/m as calculated from the following:   Height as of this encounter: 5\' 4"  (1.626 m).   Weight as of this encounter: 115 kg.   DVT prophylaxis: Lovenox Code Status: Full code Family Communication: Care discussed with daughter Disposition Plan:  Status is: Observation The patient remains OBS appropriate and will d/c before 2 midnights.    Consultants:  Psych   Procedures:    Antimicrobials:    Subjective: She is still not feeling well. Report having diarrhea still. She is trying to drink fluids. She would try to eat some.  What bothers her most is restless legs. She would try to take gabapentin.   Objective: Vitals:   06/17/23 0601 06/17/23 1230 06/17/23 2154 06/18/23 0641  BP: (!) 152/82 (!) 153/81 (!) 144/84 (!) 163/112  Pulse: (!) 104 (!) 103    Resp: 18  18 18   Temp: 98.2 F (36.8 C) 97.6 F (36.4 C) 98.3 F (36.8 C) 98.2 F (36.8 C)  TempSrc: Oral Oral Oral Oral  SpO2: 99% 100% 99% 98%  Weight:      Height:        Intake/Output Summary (Last 24 hours) at 06/18/2023 1351 Last  data filed at 06/18/2023 4401 Gross per 24 hour  Intake 1408.06 ml  Output 600 ml  Net 808.06 ml    Filed Weights   06/15/23 1354  Weight: 115 kg    Examination:  General exam: NAD, appears more calm  Respiratory system: CTA Cardiovascular system: S 1, S 2 RRR Gastrointestinal system: BS present, soft,  nt Central nervous system:alert, answer questions.   Extremities: No edema    Data Reviewed: I have personally reviewed following labs and  imaging studies  CBC: Recent Labs  Lab 06/14/23 1735 06/15/23 1413 06/16/23 0921 06/18/23 0808  WBC 12.8* 13.5* 10.5 8.5  HGB 14.4 13.9 13.6 13.7  HCT 44.2 42.2 41.8 42.5  MCV 86.3 86.3 87.4 88.2  PLT 261 255 237 236    Basic Metabolic Panel: Recent Labs  Lab 06/14/23 1735 06/15/23 1413 06/16/23 0921 06/18/23 0808  NA 138 141 139 136  K 3.5 3.5 3.3* 3.4*  CL 102 102 102 101  CO2 23 24 23  18*  GLUCOSE 108* 102* 82 84  BUN 13 11 10  5*  CREATININE 0.79 0.77 0.79 0.73  CALCIUM 9.4 9.3 9.2 8.9  MG  --   --   --  2.0    GFR: Estimated Creatinine Clearance: 96.5 mL/min (by C-G formula based on SCr of 0.73 mg/dL). Liver Function Tests: Recent Labs  Lab 06/14/23 1735 06/15/23 1413 06/16/23 0921  AST 19 22 31   ALT 15 17 24   ALKPHOS 110 105 102  BILITOT 0.6 1.0 1.2  PROT 8.1 8.1 8.0  ALBUMIN 4.8 4.8 4.7    Recent Labs  Lab 06/14/23 1735 06/15/23 1413  LIPASE 89* 75*    No results for input(s): "AMMONIA" in the last 168 hours. Coagulation Profile: No results for input(s): "INR", "PROTIME" in the last 168 hours. Cardiac Enzymes: No results for input(s): "CKTOTAL", "CKMB", "CKMBINDEX", "TROPONINI" in the last 168 hours. BNP (last 3 results) No results for input(s): "PROBNP" in the last 8760 hours. HbA1C: No results for input(s): "HGBA1C" in the last 72 hours. CBG: Recent Labs  Lab 06/14/23 1657  GLUCAP 108*    Lipid Profile: No results for input(s): "CHOL", "HDL", "LDLCALC", "TRIG", "CHOLHDL", "LDLDIRECT" in the last 72 hours. Thyroid Function Tests: No results for input(s): "TSH", "T4TOTAL", "FREET4", "T3FREE", "THYROIDAB" in the last 72 hours. Anemia Panel: No results for input(s): "VITAMINB12", "FOLATE", "FERRITIN", "TIBC", "IRON", "RETICCTPCT" in the last 72 hours. Sepsis Labs: No results for input(s): "PROCALCITON", "LATICACIDVEN" in the last 168 hours.  Recent Results (from the past 240 hour(s))  Urine Culture (for pregnant, neutropenic or  urologic patients or patients with an indwelling urinary catheter)     Status: Abnormal   Collection Time: 06/15/23  9:45 AM   Specimen: Urine, Clean Catch  Result Value Ref Range Status   Specimen Description   Final    URINE, CLEAN CATCH Performed at [facility], [address], [place], [place] [address]    Special Requests   Final    NONE Performed at [facility], [address] [place], [place] [address]    Culture >=100,000 COLONIES/mL ESCHERICHIA COLI (A)  Final   Report Status 06/18/2023 FINAL  Final   Organism ID, Bacteria ESCHERICHIA COLI (A)  Final      Susceptibility   Escherichia coli - MIC*    AMPICILLIN >=32 RESISTANT Resistant     CEFAZOLIN <=4 SENSITIVE Sensitive     CEFEPIME <=0.12 SENSITIVE Sensitive     CEFTRIAXONE <=  0.25 SENSITIVE Sensitive     CIPROFLOXACIN <=0.25 SENSITIVE Sensitive     GENTAMICIN <=1 SENSITIVE Sensitive     IMIPENEM <=0.25 SENSITIVE Sensitive     NITROFURANTOIN <=16 SENSITIVE Sensitive     TRIMETH/SULFA <=20 SENSITIVE Sensitive     AMPICILLIN/SULBACTAM 16 INTERMEDIATE Intermediate     PIP/TAZO <=4 SENSITIVE Sensitive     * >=100,000 COLONIES/mL ESCHERICHIA COLI  Gastrointestinal Panel by PCR , Stool     Status: Abnormal   Collection Time: 06/16/23  7:11 AM   Specimen: Stool  Result Value Ref Range Status   Campylobacter species NOT DETECTED NOT DETECTED Final   Plesimonas shigelloides NOT DETECTED NOT DETECTED Final   Salmonella species NOT DETECTED NOT DETECTED Final   Yersinia enterocolitica NOT DETECTED NOT DETECTED Final   Vibrio species NOT DETECTED NOT DETECTED Final   Vibrio cholerae NOT DETECTED NOT DETECTED Final   Enteroaggregative E coli (EAEC) NOT DETECTED NOT DETECTED Final   Enteropathogenic E coli (EPEC) DETECTED (A) NOT DETECTED Final    Comment: RESULT CALLED TO, READ BACK BY AND VERIFIED WITH:  CRISTIE BLUN AT 714 06/17/23 JG    Enterotoxigenic E coli (ETEC) NOT DETECTED NOT  DETECTED Final   Shiga like toxin producing E coli (STEC) NOT DETECTED NOT DETECTED Final   Shigella/Enteroinvasive E coli (EIEC) NOT DETECTED NOT DETECTED Final   Cryptosporidium NOT DETECTED NOT DETECTED Final   Cyclospora cayetanensis NOT DETECTED NOT DETECTED Final   Entamoeba histolytica NOT DETECTED NOT DETECTED Final   Giardia lamblia NOT DETECTED NOT DETECTED Final   Adenovirus F40/41 NOT DETECTED NOT DETECTED Final   Astrovirus NOT DETECTED NOT DETECTED Final   Norovirus GI/GII DETECTED (A) NOT DETECTED Final    Comment: RESULT CALLED TO, READ BACK BY AND VERIFIED WITH:  CRISTIE BLUN AT 714 06/17/23 JG    Rotavirus A NOT DETECTED NOT DETECTED Final   Sapovirus (I, II, IV, and V) NOT DETECTED NOT DETECTED Final    Comment: Performed at [facility], [address] [place], [place] [address]  C Difficile Quick Screen w PCR reflex     Status: None   Collection Time: 06/16/23  7:11 AM   Specimen: Stool  Result Value Ref Range Status   C Diff antigen NEGATIVE NEGATIVE Final   C Diff toxin NEGATIVE NEGATIVE Final   C Diff interpretation No C. difficile detected.  Final    Comment: Performed at [facility], [address], [place], [place] [address]         Radiology Studies: No results found.      Scheduled Meds:  Armodafinil  250 mg Oral q morning   busPIRone  10 mg Oral BID   cariprazine  6 mg Oral Daily   enoxaparin (LOVENOX) injection  60 mg Subcutaneous Q24H   gabapentin  1,200 mg Oral QHS   gabapentin  600 mg Oral Daily   metoprolol tartrate  50 mg Oral BID   nystatin   Topical TID   pantoprazole (PROTONIX) IV  40 mg Intravenous Q12H   tapentadol  200 mg Oral Q12H   And   tapentadol  50 mg Oral Q12H   tapentadol  100 mg Oral Q1400   vortioxetine HBr  20 mg Oral Daily   Continuous Infusions:  sodium chloride 75 mL/hr at 06/18/23 1208   potassium chloride 10 mEq (06/18/23 1308)   promethazine (PHENERGAN) injection (IM or  IVPB) 12.5 mg (06/17/23 1318)     LOS: 2  days    Time spent: 35 minutes    Alba Cory, MD [facility]   If 7PM-7AM, please contact night-coverage [email or web address]  06/18/2023, 1:51 PM

## 2023-06-19 DIAGNOSIS — R1114 Bilious vomiting: Secondary | ICD-10-CM

## 2023-06-19 LAB — CBC
HCT: 40.2 % (ref 36.0–46.0)
Hemoglobin: 13.1 g/dL (ref 12.0–15.0)
MCH: 28.3 pg (ref 26.0–34.0)
MCHC: 32.6 g/dL (ref 30.0–36.0)
MCV: 86.8 fL (ref 80.0–100.0)
Platelets: 227 10*3/uL (ref 150–400)
RBC: 4.63 MIL/uL (ref 3.87–5.11)
RDW: 12.9 % (ref 11.5–15.5)
WBC: 8 10*3/uL (ref 4.0–10.5)
nRBC: 0 % (ref 0.0–0.2)

## 2023-06-19 LAB — BASIC METABOLIC PANEL
Anion gap: 14 (ref 5–15)
BUN: 8 mg/dL (ref 6–20)
CO2: 23 mmol/L (ref 22–32)
Calcium: 8.9 mg/dL (ref 8.9–10.3)
Chloride: 100 mmol/L (ref 98–111)
Creatinine, Ser: 0.75 mg/dL (ref 0.44–1.00)
GFR, Estimated: >60 mL/min (ref >60)
Glucose, Bld: 93 mg/dL (ref 70–99)
Potassium: 3.5 mmol/L (ref 3.5–5.1)
Sodium: 137 mmol/L (ref 135–145)

## 2023-06-19 MED ORDER — SACCHAROMYCES BOULARDII 250 MG PO CAPS
250.0000 mg | ORAL_CAPSULE | Freq: Two times a day (BID) | ORAL | Status: DC
  Administered 2023-06-19 – 2023-06-21 (×5): 250 mg via ORAL
  Filled 2023-06-19 (×5): qty 1

## 2023-06-19 MED ORDER — AZITHROMYCIN 250 MG PO TABS
500.0000 mg | ORAL_TABLET | Freq: Every day | ORAL | Status: AC
  Administered 2023-06-19 – 2023-06-21 (×3): 500 mg via ORAL
  Filled 2023-06-19 (×3): qty 2

## 2023-06-19 NOTE — Progress Notes (Signed)
PROGRESS NOTE  Holly Roberts ZOX:[id number] DOB: Jan 07, 1966 DOA: 06/15/2023 PCP: Ivonne Andrew, NP   LOS: 3 days   Brief Narrative / Interim history: 57 year old with past medical history significant for bipolar disorder, anxiety, depression, primary narcolepsy catalepsy, chronic pain syndrome, chronic diarrhea since she had cholecystectomy, Presented with Nausea Vomiting Abdominal Pain.  CT Scan Was Negative.  She was Initially Evaluated in the ED and was Discharged Home She Presented 6 Hours after with Persistent Symptoms. Patient was found to have gastroenteritis secondary to E. coli noted vitals, she was also found to have E. coli urinary tract infection.  Subjective / 24h Interval events: Continues to complain of significant severe diarrhea, 10+ BMs over the last 24 hours.  She also reports groin area discomfort due to loose repeated bowel movements  Assesement and Plan: Principal Problem:   N&V (nausea and vomiting) Active Problems:   Bipolar disorder (HCC)   Principal problem Nausea vomiting and abdominal pain, due to gastroenteritis caused by enteropathogenic E. coli as well as norovirus -CT abdomen and pelvis no acute finding. Continue with IV fluids. C diff negative. GI pathogen positive for E coli and Norovirus.  -Continues to have significant diarrhea, without any improvement -Attempt to add azithromycin   Active problems UTI - UA with 21--50 WBC.  Status post 3 days of ceftriaxone   Leukocytosis -Normalized.    Hypertension - on Metoprolol.    Chronic pain syndrome - Continue with Nucynta.   Bipolar disorder, anxiety, depression, primary narcolepsy with cataplexy, chronic pain syndrome - Psych consulted to assist with meds.  Continue home medication with armodafinil, buspirone, cariprazine, tapentadol and vortioxetine   Candida panniculitis groin - Continue with Nystatin.   Hypokalemia - replete and continue to monitor   Scheduled Meds:  Armodafinil  250 mg Oral  q morning   busPIRone  10 mg Oral BID   cariprazine  6 mg Oral Daily   enoxaparin (LOVENOX) injection  60 mg Subcutaneous Q24H   gabapentin  1,200 mg Oral QHS   gabapentin  600 mg Oral Daily   metoprolol tartrate  50 mg Oral BID   nystatin   Topical TID   pantoprazole (PROTONIX) IV  40 mg Intravenous Q12H   saccharomyces boulardii  250 mg Oral BID   tapentadol  200 mg Oral Q12H   And   tapentadol  50 mg Oral Q12H   tapentadol  100 mg Oral Q1400   vortioxetine HBr  20 mg Oral Daily   Continuous Infusions:  sodium chloride 75 mL/hr at 06/18/23 1208   promethazine (PHENERGAN) injection (IM or IVPB) 12.5 mg (06/18/23 1709)   PRN Meds:.diphenhydrAMINE, HYDROmorphone (DILAUDID) injection, ketorolac, LORazepam, metoprolol tartrate, naLOXone (NARCAN)  injection, ondansetron (ZOFRAN) IV, promethazine (PHENERGAN) injection (IM or IVPB), ziprasidone  Current Outpatient Medications  Medication Instructions   amLODipine (NORVASC) 10 mg, Oral, Daily   Armodafinil 250 mg, Oral, Every morning   busPIRone (BUSPAR) 10 MG tablet TAKE 1 TABLET(10 MG) BY MOUTH TWICE DAILY   doxepin (SINEQUAN) 100 mg, Oral, Daily at bedtime   folic acid (FOLVITE) 1 mg, Oral, Daily   Horizant 600-1,200 mg, Oral, 2 times daily, Take 1 tablet (600 mg) in the morning and Take 2 tablets (1200 mg) at bedtime   hydrOXYzine (ATARAX) 10 mg, Oral, 3 times daily PRN   hyoscyamine (LEVSIN/SL) 0.125 mg, Sublingual, 4 times daily PRN   metoprolol tartrate (LOPRESSOR) 50 mg, Oral, 2 times daily   Multiple Vitamin (MULTIVITAMIN WITH MINERALS) TABS tablet 1 tablet,  Oral, Daily   naloxone (NARCAN) nasal spray 4 mg/0.1 mL 1 spray, Nasal,  Once   Nucynta ER 250 mg, Oral, 2 times daily   Nucynta 200 mg, Oral, Daily   pantoprazole (PROTONIX) 40 mg, Oral, Daily   QUVIVIQ 25 MG TABS 25 tablets, Oral, Daily at bedtime   Trintellix 20 mg, Oral, Daily   Vraylar 6 mg, Oral, Daily    Diet Orders (From admission, onward)     Start      Ordered   06/18/23 0909  DIET SOFT Room service appropriate? Yes; Fluid consistency: Thin  Diet effective now       Question Answer Comment  Room service appropriate? Yes   Fluid consistency: Thin      06/18/23 0908            DVT prophylaxis:    Lab Results  Component Value Date   PLT 227 06/19/2023      Code Status: Full Code  Family Communication: no family at bedside   Status is: Inpatient Remains inpatient appropriate because: remains with severe diarrheal illness  Level of care: Med-Surg  Consultants:  none  Objective: Vitals:   06/18/23 1400 06/18/23 2209 06/19/23 0414 06/19/23 0427  BP: (!) 152/94 (!) 161/93 (!) 155/98 (!) 155/98  Pulse: 80 92 88 88  Resp: 17 18 18    Temp: 98.8 F (37.1 C) 98.4 F (36.9 C) 98 F (36.7 C)   TempSrc: Oral Oral Oral   SpO2: 99% 94% 98%   Weight:      Height:        Intake/Output Summary (Last 24 hours) at 06/19/2023 1113 Last data filed at 06/19/2023 0954 Gross per 24 hour  Intake 2837.73 ml  Output 500 ml  Net 2337.73 ml   Wt Readings from Last 3 Encounters:  06/15/23 115 kg  06/14/23 115 kg  03/13/23 115.8 kg    Examination:  Constitutional: NAD Eyes: no scleral icterus ENMT: Mucous membranes are moist.  Neck: normal, supple Respiratory: clear to auscultation bilaterally, no wheezing, no crackles.  Cardiovascular: Regular rate and rhythm, no murmurs / rubs / gallops.  Abdomen: non distended, no tenderness. Bowel sounds positive.  Musculoskeletal: no clubbing / cyanosis.   Data Reviewed: I have independently reviewed following labs and imaging studies   CBC Recent Labs  Lab 06/14/23 1735 06/15/23 1413 06/16/23 0921 06/18/23 0808 06/19/23 0453  WBC 12.8* 13.5* 10.5 8.5 8.0  HGB 14.4 13.9 13.6 13.7 13.1  HCT 44.2 42.2 41.8 42.5 40.2  PLT 261 255 237 236 227  MCV 86.3 86.3 87.4 88.2 86.8  MCH 28.1 28.4 28.5 28.4 28.3  MCHC 32.6 32.9 32.5 32.2 32.6  RDW 12.3 12.5 12.6 13.0 12.9    Recent Labs   Lab 06/14/23 1735 06/15/23 1413 06/16/23 0921 06/18/23 0808 06/19/23 0453  NA 138 141 139 136 137  K 3.5 3.5 3.3* 3.4* 3.5  CL 102 102 102 101 100  CO2 23 24 23  18* 23  GLUCOSE 108* 102* 82 84 93  BUN 13 11 10  5* 8  CREATININE 0.79 0.77 0.79 0.73 0.75  CALCIUM 9.4 9.3 9.2 8.9 8.9  AST 19 22 31   --   --   ALT 15 17 24   --   --   ALKPHOS 110 105 102  --   --   BILITOT 0.6 1.0 1.2  --   --   ALBUMIN 4.8 4.8 4.7  --   --   MG  --   --   --  2.0  --     ------------------------------------------------------------------------------------------------------------------ No results for input(s): "CHOL", "HDL", "LDLCALC", "TRIG", "CHOLHDL", "LDLDIRECT" in the last 72 hours.  Lab Results  Component Value Date   HGBA1C 5.0 10/01/2022   ------------------------------------------------------------------------------------------------------------------ No results for input(s): "TSH", "T4TOTAL", "T3FREE", "THYROIDAB" in the last 72 hours.  Invalid input(s): "FREET3"  Cardiac Enzymes No results for input(s): "CKMB", "TROPONINI", "MYOGLOBIN" in the last 168 hours.  Invalid input(s): "CK" ------------------------------------------------------------------------------------------------------------------ No results found for: "BNP"  CBG: Recent Labs  Lab 06/14/23 1657  GLUCAP 108*    Recent Results (from the past 240 hour(s))  Urine Culture (for pregnant, neutropenic or urologic patients or patients with an indwelling urinary catheter)     Status: Abnormal   Collection Time: 06/15/23  9:45 AM   Specimen: Urine, Clean Catch  Result Value Ref Range Status   Specimen Description   Final    URINE, CLEAN CATCH Performed at [facility], [address] [place], [place] [address]    Special Requests   Final    NONE Performed at [facility], [address] [place], [place] [address]    Culture >=100,000 COLONIES/mL ESCHERICHIA COLI (A)  Final    Report Status 06/18/2023 FINAL  Final   Organism ID, Bacteria ESCHERICHIA COLI (A)  Final      Susceptibility   Escherichia coli - MIC*    AMPICILLIN >=32 RESISTANT Resistant     CEFAZOLIN <=4 SENSITIVE Sensitive     CEFEPIME <=0.12 SENSITIVE Sensitive     CEFTRIAXONE <=0.25 SENSITIVE Sensitive     CIPROFLOXACIN <=0.25 SENSITIVE Sensitive     GENTAMICIN <=1 SENSITIVE Sensitive     IMIPENEM <=0.25 SENSITIVE Sensitive     NITROFURANTOIN <=16 SENSITIVE Sensitive     TRIMETH/SULFA <=20 SENSITIVE Sensitive     AMPICILLIN/SULBACTAM 16 INTERMEDIATE Intermediate     PIP/TAZO <=4 SENSITIVE Sensitive     * >=100,000 COLONIES/mL ESCHERICHIA COLI  Gastrointestinal Panel by PCR , Stool     Status: Abnormal   Collection Time: 06/16/23  7:11 AM   Specimen: Stool  Result Value Ref Range Status   Campylobacter species NOT DETECTED NOT DETECTED Final   Plesimonas shigelloides NOT DETECTED NOT DETECTED Final   Salmonella species NOT DETECTED NOT DETECTED Final   Yersinia enterocolitica NOT DETECTED NOT DETECTED Final   Vibrio species NOT DETECTED NOT DETECTED Final   Vibrio cholerae NOT DETECTED NOT DETECTED Final   Enteroaggregative E coli (EAEC) NOT DETECTED NOT DETECTED Final   Enteropathogenic E coli (EPEC) DETECTED (A) NOT DETECTED Final    Comment: RESULT CALLED TO, READ BACK BY AND VERIFIED WITH:  CRISTIE BLUN AT 714 06/17/23 JG    Enterotoxigenic E coli (ETEC) NOT DETECTED NOT DETECTED Final   Shiga like toxin producing E coli (STEC) NOT DETECTED NOT DETECTED Final   Shigella/Enteroinvasive E coli (EIEC) NOT DETECTED NOT DETECTED Final   Cryptosporidium NOT DETECTED NOT DETECTED Final   Cyclospora cayetanensis NOT DETECTED NOT DETECTED Final   Entamoeba histolytica NOT DETECTED NOT DETECTED Final   Giardia lamblia NOT DETECTED NOT DETECTED Final   Adenovirus F40/41 NOT DETECTED NOT DETECTED Final   Astrovirus NOT DETECTED NOT DETECTED Final   Norovirus GI/GII DETECTED (A) NOT DETECTED  Final    Comment: RESULT CALLED TO, READ BACK BY AND VERIFIED WITH:  CRISTIE BLUN AT 714 06/17/23 JG    Rotavirus A NOT DETECTED NOT DETECTED Final   Sapovirus (I, II, IV, and V) NOT DETECTED NOT DETECTED  Final    Comment: Performed at [facility], [address] [place], [place] [address]  C Difficile Quick Screen w PCR reflex     Status: None   Collection Time: 06/16/23  7:11 AM   Specimen: Stool  Result Value Ref Range Status   C Diff antigen NEGATIVE NEGATIVE Final   C Diff toxin NEGATIVE NEGATIVE Final   C Diff interpretation No C. difficile detected.  Final    Comment: Performed at [facility], [address] [place], [place] [address]     Radiology Studies: No results found.   Pamella Pert, MD, PhD [facility]  Between 7 am - 7 pm I am available, please contact me via Amion (for emergencies) or Securechat (non urgent messages)  Between 7 pm - 7 am I am not available, please contact night coverage MD/APP via Amion

## 2023-06-20 LAB — CBC
HCT: 42.4 % (ref 36.0–46.0)
Hemoglobin: 13.4 g/dL (ref 12.0–15.0)
MCH: 28 pg (ref 26.0–34.0)
MCHC: 31.6 g/dL (ref 30.0–36.0)
MCV: 88.5 fL (ref 80.0–100.0)
Platelets: 250 10*3/uL (ref 150–400)
RBC: 4.79 MIL/uL (ref 3.87–5.11)
RDW: 13.1 % (ref 11.5–15.5)
WBC: 8.1 10*3/uL (ref 4.0–10.5)
nRBC: 0 % (ref 0.0–0.2)

## 2023-06-20 LAB — COMPREHENSIVE METABOLIC PANEL
ALT: 27 U/L (ref 0–44)
AST: 24 U/L (ref 15–41)
Albumin: 4 g/dL (ref 3.5–5.0)
Alkaline Phosphatase: 79 U/L (ref 38–126)
Anion gap: 13 (ref 5–15)
BUN: 13 mg/dL (ref 6–20)
CO2: 21 mmol/L — ABNORMAL LOW (ref 22–32)
Calcium: 9 mg/dL (ref 8.9–10.3)
Chloride: 104 mmol/L (ref 98–111)
Creatinine, Ser: 0.75 mg/dL (ref 0.44–1.00)
GFR, Estimated: >60 mL/min (ref >60)
Glucose, Bld: 104 mg/dL — ABNORMAL HIGH (ref 70–99)
Potassium: 4.1 mmol/L (ref 3.5–5.1)
Sodium: 138 mmol/L (ref 135–145)
Total Bilirubin: 0.3 mg/dL (ref 0.3–1.2)
Total Protein: 6.8 g/dL (ref 6.5–8.1)

## 2023-06-20 LAB — MAGNESIUM: Magnesium: 2.1 mg/dL (ref 1.7–2.4)

## 2023-06-20 LAB — PHOSPHORUS: Phosphorus: 4.6 mg/dL (ref 2.5–4.6)

## 2023-06-20 NOTE — Progress Notes (Signed)
PROGRESS NOTE  Holly Roberts AOZ:[id number] DOB: [birth year] DOA: 06/15/2023 PCP: Ivonne Andrew, NP   LOS: 4 days   Brief Narrative / Interim history: 57 year old with past medical history significant for bipolar disorder, anxiety, depression, primary narcolepsy catalepsy, chronic pain syndrome, chronic diarrhea since she had cholecystectomy, Presented with Nausea Vomiting Abdominal Pain.  CT Scan Was Negative.  She was Initially Evaluated in the ED and was Discharged Home She Presented 6 Hours after with Persistent Symptoms. Patient was found to have gastroenteritis secondary to E. coli noted vitals, she was also found to have E. coli urinary tract infection.  Subjective / 24h Interval events: Still watery bowel movements but less frequency.  She tells me that she has been eating a little bit better  Assesement and Plan: Principal Problem:   N&V (nausea and vomiting) Active Problems:   Bipolar disorder (HCC)   Principal problem Nausea vomiting and abdominal pain, due to gastroenteritis caused by enteropathogenic E. coli as well as norovirus -CT abdomen and pelvis no acute finding. Continue with IV fluids. C diff negative. GI pathogen positive for E coli and Norovirus.  -Due to persistent diarrhea, azithromycin was added yesterday, seems to be improving.  If continues improvement, could go home tomorrow  Active problems UTI - UA with 21--50 WBC.  Status post 3 days of ceftriaxone   Leukocytosis -Normalized.    Hypertension - on Metoprolol.    Chronic pain syndrome - Continue with Nucynta.   Bipolar disorder, anxiety, depression, primary narcolepsy with cataplexy, chronic pain syndrome - Psych consulted to assist with meds.  Continue home medication with armodafinil, buspirone, cariprazine, tapentadol and vortioxetine   Candida panniculitis groin - Continue with Nystatin.   Hypokalemia -normalized today  Scheduled Meds:  Armodafinil  250 mg Oral q morning   azithromycin  500  mg Oral Daily   busPIRone  10 mg Oral BID   cariprazine  6 mg Oral Daily   enoxaparin (LOVENOX) injection  60 mg Subcutaneous Q24H   gabapentin  1,200 mg Oral QHS   gabapentin  600 mg Oral Daily   metoprolol tartrate  50 mg Oral BID   nystatin   Topical TID   pantoprazole (PROTONIX) IV  40 mg Intravenous Q12H   saccharomyces boulardii  250 mg Oral BID   tapentadol  200 mg Oral Q12H   And   tapentadol  50 mg Oral Q12H   tapentadol  100 mg Oral Q1400   vortioxetine HBr  20 mg Oral Daily   Continuous Infusions:  promethazine (PHENERGAN) injection (IM or IVPB) 12.5 mg (06/18/23 1709)   PRN Meds:.HYDROmorphone (DILAUDID) injection, ketorolac, LORazepam, metoprolol tartrate, naLOXone (NARCAN)  injection, ondansetron (ZOFRAN) IV, promethazine (PHENERGAN) injection (IM or IVPB)  Current Outpatient Medications  Medication Instructions   amLODipine (NORVASC) 10 mg, Oral, Daily   Armodafinil 250 mg, Oral, Every morning   busPIRone (BUSPAR) 10 MG tablet TAKE 1 TABLET(10 MG) BY MOUTH TWICE DAILY   doxepin (SINEQUAN) 100 mg, Oral, Daily at bedtime   folic acid (FOLVITE) 1 mg, Oral, Daily   Horizant 600-1,200 mg, Oral, 2 times daily, Take 1 tablet (600 mg) in the morning and Take 2 tablets (1200 mg) at bedtime   hydrOXYzine (ATARAX) 10 mg, Oral, 3 times daily PRN   hyoscyamine (LEVSIN/SL) 0.125 mg, Sublingual, 4 times daily PRN   metoprolol tartrate (LOPRESSOR) 50 mg, Oral, 2 times daily   Multiple Vitamin (MULTIVITAMIN WITH MINERALS) TABS tablet 1 tablet, Oral, Daily   naloxone (NARCAN) nasal spray  4 mg/0.1 mL 1 spray, Nasal,  Once   Nucynta ER 250 mg, Oral, 2 times daily   Nucynta 200 mg, Oral, Daily   pantoprazole (PROTONIX) 40 mg, Oral, Daily   QUVIVIQ 25 MG TABS 25 tablets, Oral, Daily at bedtime   Trintellix 20 mg, Oral, Daily   Vraylar 6 mg, Oral, Daily    Diet Orders (From admission, onward)     Start     Ordered   06/18/23 0909  DIET SOFT Room service appropriate? Yes; Fluid  consistency: Thin  Diet effective now       Question Answer Comment  Room service appropriate? Yes   Fluid consistency: Thin      06/18/23 0908            DVT prophylaxis:    Lab Results  Component Value Date   PLT 250 06/20/2023      Code Status: Full Code  Family Communication: no family at bedside   Status is: Inpatient Remains inpatient appropriate because: remains with severe diarrheal illness  Level of care: Med-Surg  Consultants:  none  Objective: Vitals:   06/19/23 0427 06/19/23 1300 06/19/23 2131 06/20/23 0417  BP: (!) 155/98 (!) 134/90 (!) 147/87 (!) 142/76  Pulse: 88 79 90 77  Resp:  18 18 18   Temp:  98.2 F (36.8 C) 98.4 F (36.9 C) 98.3 F (36.8 C)  TempSrc:  Oral  Oral  SpO2:  99% 100% 93%  Weight:      Height:        Intake/Output Summary (Last 24 hours) at 06/20/2023 1130 Last data filed at 06/20/2023 1000 Gross per 24 hour  Intake 1560 ml  Output 600 ml  Net 960 ml    Wt Readings from Last 3 Encounters:  06/15/23 115 kg  06/14/23 115 kg  03/13/23 115.8 kg    Examination:  Constitutional: NAD Eyes: lids and conjunctivae normal, no scleral icterus ENMT: mmm Neck: normal, supple Respiratory: clear to auscultation bilaterally, no wheezing, no crackles. Normal respiratory effort.  Cardiovascular: Regular rate and rhythm, no murmurs / rubs / gallops. No LE edema. Abdomen: soft, no distention, no tenderness. Bowel sounds positive.   Data Reviewed: I have independently reviewed following labs and imaging studies   CBC Recent Labs  Lab 06/15/23 1413 06/16/23 0921 06/18/23 0808 06/19/23 0453 06/20/23 0415  WBC 13.5* 10.5 8.5 8.0 8.1  HGB 13.9 13.6 13.7 13.1 13.4  HCT 42.2 41.8 42.5 40.2 42.4  PLT 255 237 236 227 250  MCV 86.3 87.4 88.2 86.8 88.5  MCH 28.4 28.5 28.4 28.3 28.0  MCHC 32.9 32.5 32.2 32.6 31.6  RDW 12.5 12.6 13.0 12.9 13.1     Recent Labs  Lab 06/14/23 1735 06/15/23 1413 06/16/23 0921 06/18/23 0808  06/19/23 0453 06/20/23 0415  NA 138 141 139 136 137 138  K 3.5 3.5 3.3* 3.4* 3.5 4.1  CL 102 102 102 101 100 104  CO2 23 24 23  18* 23 21*  GLUCOSE 108* 102* 82 84 93 104*  BUN 13 11 10  5* 8 13  CREATININE 0.79 0.77 0.79 0.73 0.75 0.75  CALCIUM 9.4 9.3 9.2 8.9 8.9 9.0  AST 19 22 31   --   --  24  ALT 15 17 24   --   --  27  ALKPHOS 110 105 102  --   --  79  BILITOT 0.6 1.0 1.2  --   --  0.3  ALBUMIN 4.8 4.8 4.7  --   --  4.0  MG  --   --   --  2.0  --  2.1     ------------------------------------------------------------------------------------------------------------------ No results for input(s): "CHOL", "HDL", "LDLCALC", "TRIG", "CHOLHDL", "LDLDIRECT" in the last 72 hours.  Lab Results  Component Value Date   HGBA1C 5.0 10/01/2022   ------------------------------------------------------------------------------------------------------------------ No results for input(s): "TSH", "T4TOTAL", "T3FREE", "THYROIDAB" in the last 72 hours.  Invalid input(s): "FREET3"  Cardiac Enzymes No results for input(s): "CKMB", "TROPONINI", "MYOGLOBIN" in the last 168 hours.  Invalid input(s): "CK" ------------------------------------------------------------------------------------------------------------------ No results found for: "BNP"  CBG: Recent Labs  Lab 06/14/23 1657  GLUCAP 108*     Recent Results (from the past 240 hour(s))  Urine Culture (for pregnant, neutropenic or urologic patients or patients with an indwelling urinary catheter)     Status: Abnormal   Collection Time: 06/15/23  9:45 AM   Specimen: Urine, Clean Catch  Result Value Ref Range Status   Specimen Description   Final    URINE, CLEAN CATCH Performed at [facility], [address] [place], [place] [address]    Special Requests   Final    NONE Performed at [facility], [address], [place], [place] [address]    Culture >=100,000 COLONIES/mL ESCHERICHIA COLI (A)   Final   Report Status 06/18/2023 FINAL  Final   Organism ID, Bacteria ESCHERICHIA COLI (A)  Final      Susceptibility   Escherichia coli - MIC*    AMPICILLIN >=32 RESISTANT Resistant     CEFAZOLIN <=4 SENSITIVE Sensitive     CEFEPIME <=0.12 SENSITIVE Sensitive     CEFTRIAXONE <=0.25 SENSITIVE Sensitive     CIPROFLOXACIN <=0.25 SENSITIVE Sensitive     GENTAMICIN <=1 SENSITIVE Sensitive     IMIPENEM <=0.25 SENSITIVE Sensitive     NITROFURANTOIN <=16 SENSITIVE Sensitive     TRIMETH/SULFA <=20 SENSITIVE Sensitive     AMPICILLIN/SULBACTAM 16 INTERMEDIATE Intermediate     PIP/TAZO <=4 SENSITIVE Sensitive     * >=100,000 COLONIES/mL ESCHERICHIA COLI  Gastrointestinal Panel by PCR , Stool     Status: Abnormal   Collection Time: 06/16/23  7:11 AM   Specimen: Stool  Result Value Ref Range Status   Campylobacter species NOT DETECTED NOT DETECTED Final   Plesimonas shigelloides NOT DETECTED NOT DETECTED Final   Salmonella species NOT DETECTED NOT DETECTED Final   Yersinia enterocolitica NOT DETECTED NOT DETECTED Final   Vibrio species NOT DETECTED NOT DETECTED Final   Vibrio cholerae NOT DETECTED NOT DETECTED Final   Enteroaggregative E coli (EAEC) NOT DETECTED NOT DETECTED Final   Enteropathogenic E coli (EPEC) DETECTED (A) NOT DETECTED Final    Comment: RESULT CALLED TO, READ BACK BY AND VERIFIED WITH:  CRISTIE BLUN AT 714 06/17/23 JG    Enterotoxigenic E coli (ETEC) NOT DETECTED NOT DETECTED Final   Shiga like toxin producing E coli (STEC) NOT DETECTED NOT DETECTED Final   Shigella/Enteroinvasive E coli (EIEC) NOT DETECTED NOT DETECTED Final   Cryptosporidium NOT DETECTED NOT DETECTED Final   Cyclospora cayetanensis NOT DETECTED NOT DETECTED Final   Entamoeba histolytica NOT DETECTED NOT DETECTED Final   Giardia lamblia NOT DETECTED NOT DETECTED Final   Adenovirus F40/41 NOT DETECTED NOT DETECTED Final   Astrovirus NOT DETECTED NOT DETECTED Final   Norovirus GI/GII DETECTED (A) NOT  DETECTED Final    Comment: RESULT CALLED TO, READ BACK BY AND VERIFIED WITH:  CRISTIE BLUN AT 714 06/17/23 JG    Rotavirus A NOT DETECTED  NOT DETECTED Final   Sapovirus (I, II, IV, and V) NOT DETECTED NOT DETECTED Final    Comment: Performed at [facility], [address] [place], [place] [address]  C Difficile Quick Screen w PCR reflex     Status: None   Collection Time: 06/16/23  7:11 AM   Specimen: Stool  Result Value Ref Range Status   C Diff antigen NEGATIVE NEGATIVE Final   C Diff toxin NEGATIVE NEGATIVE Final   C Diff interpretation No C. difficile detected.  Final    Comment: Performed at [facility], [address] [place], [place] [address]     Radiology Studies: No results found.   Pamella Pert, MD, PhD [facility]  Between 7 am - 7 pm I am available, please contact me via Amion (for emergencies) or Securechat (non urgent messages)  Between 7 pm - 7 am I am not available, please contact night coverage MD/APP via Amion

## 2023-06-20 NOTE — Progress Notes (Signed)
Spoke with daughter, Hope Budds, via phone. Explained policy of having home medications at pt's bedside. Multiple bottles found in personal bag in window seat of room and needed family to take them home, if possible. There was no evidence that pt used home meds, and pt stated she didn't use meds from home. Carollee Herter assured me that she would be here around 7:30 pm to get them. To report to next shift RN. Medications secured.

## 2023-06-20 NOTE — Progress Notes (Signed)
Dr. Elvera Lennox aware pt has container of Cholestyramine at bedside that family brought from home. Told MD daughter was coming this evening so I can send it home with her. Dr. Elvera Lennox replied "she can have cholestyramine from the family, sure, it wont hurt."

## 2023-06-21 LAB — BASIC METABOLIC PANEL
Anion gap: 9 (ref 5–15)
BUN: 17 mg/dL (ref 6–20)
CO2: 26 mmol/L (ref 22–32)
Calcium: 8.8 mg/dL — ABNORMAL LOW (ref 8.9–10.3)
Chloride: 103 mmol/L (ref 98–111)
Creatinine, Ser: 0.67 mg/dL (ref 0.44–1.00)
GFR, Estimated: >60 mL/min (ref >60)
Glucose, Bld: 112 mg/dL — ABNORMAL HIGH (ref 70–99)
Potassium: 4.1 mmol/L (ref 3.5–5.1)
Sodium: 138 mmol/L (ref 135–145)

## 2023-06-21 LAB — MAGNESIUM: Magnesium: 1.9 mg/dL (ref 1.7–2.4)

## 2023-06-21 MED ORDER — AZITHROMYCIN 500 MG PO TABS: 500.0000 mg | ORAL_TABLET | Freq: Every day | ORAL | 0 refills | Status: AC

## 2023-06-21 MED ORDER — SACCHAROMYCES BOULARDII 250 MG PO CAPS: 250.0000 mg | ORAL_CAPSULE | Freq: Two times a day (BID) | ORAL | 0 refills | Status: AC

## 2023-06-21 MED ORDER — CLOBETASOL PROPIONATE 0.05 % EX CREA: 1.0000 | TOPICAL_CREAM | Freq: Two times a day (BID) | CUTANEOUS | 0 refills | Status: AC

## 2023-06-21 MED ORDER — NYSTATIN 100000 UNIT/GM EX POWD: Freq: Three times a day (TID) | CUTANEOUS | 0 refills | Status: DC

## 2023-06-21 NOTE — Discharge Summary (Signed)
Physician Discharge Summary  Holly Roberts UXL:[id number] DOB: 1966/03/20 DOA: 06/15/2023  PCP: Ivonne Andrew, NP  Admit date: 06/15/2023 Discharge date: 06/21/2023  Admitted From: home Disposition:  home  Recommendations for Outpatient Follow-up:  Follow up with PCP in 1-2 weeks  Home Health: none Equipment/Devices: none  Discharge Condition: stable CODE STATUS: Full code  HPI: Per admitting MD, Holly Roberts is a 57 y.o. female with medical history significant of bipolar disorder, anxiety, depression, primary narcolepsy with catalepsy, chronic pain syndrome, chronic diarrhea since gallbladder surgery, presents with  n/v/ab pain, was seen in the ED earlier with negative CT , was discharged home , however returned to the ED 6hrs after discharged from the ED with persistent symptom , not able tolerate oral meds, appear dehydrated with sinus tachycardia   Hospital Course / Discharge diagnoses: Principal Problem:   N&V (nausea and vomiting) Active Problems:   Bipolar disorder (HCC)   Principal problem Nausea vomiting and abdominal pain, due to gastroenteritis caused by enteropathogenic E. coli as well as norovirus -CT abdomen and pelvis no acute finding.  She was placed on IV fluids, initially NPO.  C. difficile was negative, GI pathogen panel was positive for E. coli as well as norovirus.  Due to persistent diarrhea, azithromycin was added with subsequent improvement in her diarrhea.  Stools are starting to form, her diet was advanced and she is now tolerating a regular diet and her home meds.  With good p.o. intake and clinical improvement, she will be discharged home in stable condition.   Active problems UTI - UA with 21--50 WBC.  Status post 3 days of ceftriaxone Leukocytosis -Normalized.  Hypertension - on Metoprolol.  Chronic pain syndrome - Continue with Nucynta. Bipolar disorder, anxiety, depression, primary narcolepsy with cataplexy, chronic pain syndrome -continue home  meds on discharge  Candida panniculitis groin - s/p Nystatin.  Hypokalemia -normalized today  Sepsis ruled out   Discharge Instructions   Allergies as of 06/21/2023       Reactions   Buprenorphine Rash   Patch gave rash- poss adhesive issues. Tried generic and brand name both. Patient states: "I am not allergic to the medication. I am allergic to the adhesive, not the medication."    Amoxicillin-pot Clavulanate    Other reaction(s): Other (See Comments) States "strong" antibiotics cause C diff   Wound Dressing Adhesive    Rash   Sulfa Antibiotics Rash        Medication List     STOP taking these medications    ondansetron 4 MG disintegrating tablet Commonly known as: ZOFRAN-ODT       TAKE these medications    amLODipine 10 MG tablet Commonly known as: NORVASC TAKE 1 TABLET BY MOUTH EVERY DAY   Armodafinil 250 MG tablet Take 250 mg by mouth every morning.   azithromycin 500 MG tablet Commonly known as: ZITHROMAX Take 1 tablet (500 mg total) by mouth daily for 2 days.   busPIRone 10 MG tablet Commonly known as: BUSPAR TAKE 1 TABLET(10 MG) BY MOUTH TWICE DAILY What changed: See the new instructions.   doxepin 100 MG capsule Commonly known as: SINEQUAN Take 100 mg by mouth at bedtime.   folic acid 1 MG tablet Commonly known as: FOLVITE TAKE 1 TABLET BY MOUTH EVERY DAY   Horizant 600 MG Tbcr Generic drug: Gabapentin Enacarbil Take 600-1,200 mg by mouth in the morning and at bedtime. Take 1 tablet (600 mg) in the morning and Take 2 tablets (1200 mg) at  bedtime   hydrOXYzine 10 MG tablet Commonly known as: ATARAX TAKE 1 TABLET BY MOUTH THREE TIMES A DAY AS NEEDED   hyoscyamine 0.125 MG SL tablet Commonly known as: Levsin/SL Place 1 tablet (0.125 mg total) under the tongue 4 (four) times daily as needed for up to 5 days.   metoprolol tartrate 50 MG tablet Commonly known as: LOPRESSOR TAKE 1 TABLET BY MOUTH TWICE A DAY   multivitamin with minerals  Tabs tablet Take 1 tablet by mouth daily. What changed: additional instructions   naloxone 4 MG/0.1ML Liqd nasal spray kit Commonly known as: NARCAN Place 1 spray into the nose once.   Nucynta 100 MG Tabs Generic drug: Tapentadol HCl Take 2 tablets (200 mg total) by mouth daily in the afternoon. What changed:  how much to take additional instructions   Nucynta ER 250 MG Tb12 Generic drug: Tapentadol HCl Take 250 mg by mouth 2 (two) times daily. What changed: Another medication with the same name was changed. Make sure you understand how and when to take each.   pantoprazole 40 MG tablet Commonly known as: PROTONIX TAKE 1 TABLET BY MOUTH EVERY DAY   Quviviq 50 MG Tabs Generic drug: Daridorexant HCl Take 50 mg by mouth at bedtime.   saccharomyces boulardii 250 MG capsule Commonly known as: FLORASTOR Take 1 capsule (250 mg total) by mouth 2 (two) times daily for 14 days.   Trintellix 20 MG Tabs tablet Generic drug: vortioxetine HBr Take 20 mg by mouth daily.   Vraylar 6 MG Caps Generic drug: Cariprazine HCl Take 6 mg by mouth daily.       Consultations: none  Procedures/Studies:  DG CHEST PORT 1 VIEW  Result Date: 06/15/2023 CLINICAL DATA:  Shortness of breath EXAM: PORTABLE CHEST 1 VIEW COMPARISON:  07/30/2021 FINDINGS: Lungs are clear.  No pleural effusion or pneumothorax. The heart is normal in size. Degenerative changes of the bilateral shoulders. IMPRESSION: No acute cardiopulmonary disease. Electronically Signed   By: Charline Bills M.D.   On: 06/15/2023 21:12   CT ABDOMEN PELVIS W CONTRAST  Result Date: 06/14/2023 CLINICAL DATA:  Abdominal pain, nausea and vomiting EXAM: CT ABDOMEN AND PELVIS WITH CONTRAST TECHNIQUE: Multidetector CT imaging of the abdomen and pelvis was performed using the standard protocol following bolus administration of intravenous contrast. RADIATION DOSE REDUCTION: This exam was performed according to the departmental  dose-optimization program which includes automated exposure control, adjustment of the mA and/or kV according to patient size and/or use of iterative reconstruction technique. CONTRAST:  OMNIPAQUE IOHEXOL 300 MG/ML  SOLN COMPARISON:  12/21/2021 FINDINGS: Lower chest: No acute pleural or parenchymal lung disease. Hepatobiliary: No focal liver abnormality is seen. Status post cholecystectomy. No biliary dilatation. Pancreas: Unremarkable. No pancreatic ductal dilatation or surrounding inflammatory changes. Spleen: Normal in size without focal abnormality. Adrenals/Urinary Tract: Adrenal glands are unremarkable. Stable appearance of the kidneys. No nephrolithiasis or obstructive uropathy. Bladder is unremarkable. Stomach/Bowel: No bowel obstruction or ileus. Normal appendix right lower quadrant. No bowel wall thickening or inflammatory change. Vascular/Lymphatic: Aortic atherosclerosis. No enlarged abdominal or pelvic lymph nodes. Reproductive: Uterus and bilateral adnexa are unremarkable. Other: No free fluid or free intraperitoneal gas. No abdominal wall hernia. Musculoskeletal: No acute or destructive bony abnormalities. 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 Severe L5-S1 spondylosis unchanged. Reconstructed images demonstrate no additional findings. IMPRESSION: 1. No acute intra-abdominal or intrapelvic process. 2.  Aortic Atherosclerosis (ICD10-I70.0). Electronically Signed   By: Maxwell Caul.D.  On: 06/14/2023 23:41     Subjective: - no chest pain, shortness of breath, no abdominal pain, nausea or vomiting.   Discharge Exam: BP 135/78 (BP Location: Left Arm)   Pulse 68   Temp 97.8 F (36.6 C) (Oral)   Resp 16   Ht 5\' 4"  (1.626 m)   Wt 115 kg   SpO2 95%   BMI 43.52 kg/m   General: Pt is alert, awake, not in acute distress Cardiovascular: RRR, S1/S2 +, no rubs, no gallops Respiratory: CTA bilaterally, no wheezing, no rhonchi Abdominal: Soft, NT,  ND, bowel sounds + Extremities: no edema, no cyanosis    The results of significant diagnostics from this hospitalization (including imaging, microbiology, ancillary and laboratory) are listed below for reference.     Microbiology: Recent Results (from the past 240 hour(s))  Urine Culture (for pregnant, neutropenic or urologic patients or patients with an indwelling urinary catheter)     Status: Abnormal   Collection Time: 06/15/23  9:45 AM   Specimen: Urine, Clean Catch  Result Value Ref Range Status   Specimen Description   Final    URINE, CLEAN CATCH Performed at [facility], [address] [place], [place] [address]    Special Requests   Final    NONE Performed at [facility], [address], [place], [place] [address]    Culture >=100,000 COLONIES/mL ESCHERICHIA COLI (A)  Final   Report Status 06/18/2023 FINAL  Final   Organism ID, Bacteria ESCHERICHIA COLI (A)  Final      Susceptibility   Escherichia coli - MIC*    AMPICILLIN >=32 RESISTANT Resistant     CEFAZOLIN <=4 SENSITIVE Sensitive     CEFEPIME <=0.12 SENSITIVE Sensitive     CEFTRIAXONE <=0.25 SENSITIVE Sensitive     CIPROFLOXACIN <=0.25 SENSITIVE Sensitive     GENTAMICIN <=1 SENSITIVE Sensitive     IMIPENEM <=0.25 SENSITIVE Sensitive     NITROFURANTOIN <=16 SENSITIVE Sensitive     TRIMETH/SULFA <=20 SENSITIVE Sensitive     AMPICILLIN/SULBACTAM 16 INTERMEDIATE Intermediate     PIP/TAZO <=4 SENSITIVE Sensitive     * >=100,000 COLONIES/mL ESCHERICHIA COLI  Gastrointestinal Panel by PCR , Stool     Status: Abnormal   Collection Time: 06/16/23  7:11 AM   Specimen: Stool  Result Value Ref Range Status   Campylobacter species NOT DETECTED NOT DETECTED Final   Plesimonas shigelloides NOT DETECTED NOT DETECTED Final   Salmonella species NOT DETECTED NOT DETECTED Final   Yersinia enterocolitica NOT DETECTED NOT DETECTED Final   Vibrio species NOT DETECTED NOT DETECTED Final    Vibrio cholerae NOT DETECTED NOT DETECTED Final   Enteroaggregative E coli (EAEC) NOT DETECTED NOT DETECTED Final   Enteropathogenic E coli (EPEC) DETECTED (A) NOT DETECTED Final    Comment: RESULT CALLED TO, READ BACK BY AND VERIFIED WITH:  CRISTIE BLUN AT 714 06/17/23 JG    Enterotoxigenic E coli (ETEC) NOT DETECTED NOT DETECTED Final   Shiga like toxin producing E coli (STEC) NOT DETECTED NOT DETECTED Final   Shigella/Enteroinvasive E coli (EIEC) NOT DETECTED NOT DETECTED Final   Cryptosporidium NOT DETECTED NOT DETECTED Final   Cyclospora cayetanensis NOT DETECTED NOT DETECTED Final   Entamoeba histolytica NOT DETECTED NOT DETECTED Final   Giardia lamblia NOT DETECTED NOT DETECTED Final   Adenovirus F40/41 NOT DETECTED NOT DETECTED Final   Astrovirus NOT DETECTED NOT DETECTED Final   Norovirus GI/GII DETECTED (A) NOT DETECTED Final  Comment: RESULT CALLED TO, READ BACK BY AND VERIFIED WITH:  CRISTIE BLUN AT 714 06/17/23 JG    Rotavirus A NOT DETECTED NOT DETECTED Final   Sapovirus (I, II, IV, and V) NOT DETECTED NOT DETECTED Final    Comment: Performed at [facility], [address] [place], [place] [address]  C Difficile Quick Screen w PCR reflex     Status: None   Collection Time: 06/16/23  7:11 AM   Specimen: Stool  Result Value Ref Range Status   C Diff antigen NEGATIVE NEGATIVE Final   C Diff toxin NEGATIVE NEGATIVE Final   C Diff interpretation No C. difficile detected.  Final    Comment: Performed at [facility], [address], [place], [place] [address]     Labs: Basic Metabolic Panel: Recent Labs  Lab 06/16/23 [phone] 06/18/23 0808 06/19/23 0453 06/20/23 0415 06/21/23 0427  NA 139 136 137 138 138  K 3.3* 3.4* 3.5 4.1 4.1  CL 102 101 100 104 103  CO2 23 18* 23 21* 26  GLUCOSE 82 84 93 104* 112*  BUN 10 5* 8 13 17   CREATININE 0.79 0.73 0.75 0.75 0.67  CALCIUM 9.2 8.9 8.9 9.0 8.8*  MG  --  2.0  --  2.1 1.9  PHOS  --   --   --   4.6  --    Liver Function Tests: Recent Labs  Lab 06/14/23 1735 06/15/23 1413 06/16/23 0921 06/20/23 0415  AST 19 22 31 24   ALT 15 17 24 27   ALKPHOS 110 105 102 79  BILITOT 0.6 1.0 1.2 0.3  PROT 8.1 8.1 8.0 6.8  ALBUMIN 4.8 4.8 4.7 4.0   CBC: Recent Labs  Lab 06/15/23 1413 06/16/23 0921 06/18/23 0808 06/19/23 0453 06/20/23 0415  WBC 13.5* 10.5 8.5 8.0 8.1  HGB 13.9 13.6 13.7 13.1 13.4  HCT 42.2 41.8 42.5 40.2 42.4  MCV 86.3 87.4 88.2 86.8 88.5  PLT 255 237 236 227 250   CBG: Recent Labs  Lab 06/14/23 1657  GLUCAP 108*   Hgb A1c No results for input(s): "HGBA1C" in the last 72 hours. Lipid Profile No results for input(s): "CHOL", "HDL", "LDLCALC", "TRIG", "CHOLHDL", "LDLDIRECT" in the last 72 hours. Thyroid function studies No results for input(s): "TSH", "T4TOTAL", "T3FREE", "THYROIDAB" in the last 72 hours.  Invalid input(s): "FREET3" Urinalysis    Component Value Date/Time   COLORURINE YELLOW 06/15/2023 2003   APPEARANCEUR HAZY (A) 06/15/2023 2003   LABSPEC 1.018 06/15/2023 2003   PHURINE 5.0 06/15/2023 2003   GLUCOSEU NEGATIVE 06/15/2023 2003   HGBUR MODERATE (A) 06/15/2023 2003   BILIRUBINUR NEGATIVE 06/15/2023 2003   KETONESUR 20 (A) 06/15/2023 2003   PROTEINUR 30 (A) 06/15/2023 2003   NITRITE NEGATIVE 06/15/2023 2003   LEUKOCYTESUR MODERATE (A) 06/15/2023 2003    FURTHER DISCHARGE INSTRUCTIONS:   Get Medicines reviewed and adjusted: Please take all your medications with you for your next visit with your Primary MD   Laboratory/radiological data: Please request your Primary MD to go over all hospital tests and procedure/radiological results at the follow up, please ask your Primary MD to get all Hospital records sent to his/her office.   In some cases, they will be blood work, cultures and biopsy results pending at the time of your discharge. Please request that your primary care M.D. goes through all the records of your hospital data and  follows up on these results.   Also Note the following: If you experience  worsening of your admission symptoms, develop shortness of breath, life threatening emergency, suicidal or homicidal thoughts you must seek medical attention immediately by calling 911 or calling your MD immediately  if symptoms less severe.   You must read complete instructions/literature along with all the possible adverse reactions/side effects for all the Medicines you take and that have been prescribed to you. Take any new Medicines after you have completely understood and accpet all the possible adverse reactions/side effects.    Do not drive when taking Pain medications or sleeping medications (Benzodaizepines)   Do not take more than prescribed Pain, Sleep and Anxiety Medications. It is not advisable to combine anxiety,sleep and pain medications without talking with your primary care practitioner   Special Instructions: If you have smoked or chewed Tobacco  in the last 2 yrs please stop smoking, stop any regular Alcohol  and or any Recreational drug use.   Wear Seat belts while driving.   Please note: You were cared for by a hospitalist during your hospital stay. Once you are discharged, your primary care physician will handle any further medical issues. Please note that NO REFILLS for any discharge medications will be authorized once you are discharged, as it is imperative that you return to your primary care physician (or establish a relationship with a primary care physician if you do not have one) for your post hospital discharge needs so that they can reassess your need for medications and monitor your lab values.  Time coordinating discharge: 35 minutes  SIGNED:  Pamella Pert, MD, PhD 06/21/2023, 10:29 AM

## 2023-06-24 ENCOUNTER — Telehealth: Payer: Self-pay

## 2023-06-24 NOTE — Transitions of Care (Post Inpatient/ED Visit) (Signed)
06/24/2023  Name: Holly Roberts MRN: [id number] DOB: 08-23-1966  Today's TOC FU Call Status: Today's TOC FU Call Status:: Successful TOC FU Call Competed TOC FU Call Complete Date: 06/24/23  Transition Care Management Follow-up Telephone Call Date of Discharge: 06/21/23 Discharge Facility: [facility]) Type of Discharge: Inpatient Admission Primary Inpatient Discharge Diagnosis:: N&V (nausea and vomiting) How have you been since you were released from the hospital?: Better Any questions or concerns?: No  Items Reviewed: Did you receive and understand the discharge instructions provided?: Yes Medications obtained,verified, and reconciled?: Yes (Medications Reviewed) Any new allergies since your discharge?: No Dietary orders reviewed?: Yes Do you have support at home?: Yes  Medications Reviewed Today: Medications Reviewed Today     Reviewed by Merleen Nicely, LPN (Licensed Practical Nurse) on 06/24/23 at 1201  Med List Status: <None>   Medication Order Taking? Sig Documenting Provider Last Dose Status Informant  amLODipine (NORVASC) 10 MG tablet [id number] Yes TAKE 1 TABLET BY MOUTH EVERY DAY Ivonne Andrew, NP Taking Active   Armodafinil 250 MG tablet [id number] Yes Take 250 mg by mouth every morning. [provider] Taking Active Family Member           Med Note Jomarie Longs, REGEENA   Fri Dec 22, 2021  2:13 PM)    busPIRone (BUSPAR) 10 MG tablet [id number] Yes TAKE 1 TABLET(10 MG) BY MOUTH TWICE DAILY  Patient taking differently: Take 10 mg by mouth 2 (two) times daily.   Ivonne Andrew, NP Taking Active Family Member  clobetasol cream (TEMOVATE) 0.05 % [id number] Yes Apply 1 Application topically 2 (two) times daily. Leatha Gilding, MD Taking Active   Daridorexant HCl (QUVIVIQ) 50 MG TABS [id number] Yes Take 50 mg by mouth at bedtime. [provider] Taking Active Self  doxepin (SINEQUAN) 100 MG capsule [id number] Yes Take 100 mg by mouth at bedtime.  [provider] Taking Active Family Member  folic acid (FOLVITE) 1 MG tablet [id number] Yes TAKE 1 TABLET BY MOUTH EVERY DAY Ivonne Andrew, NP Taking Active   HORIZANT 600 MG TBCR [id number] Yes Take 600-1,200 mg by mouth in the morning and at bedtime. Take 1 tablet (600 mg) in the morning and Take 2 tablets (1200 mg) at bedtime [provider] Taking Active Family Member           Med Note Jomarie Longs, REGEENA   Fri Dec 22, 2021  2:09 PM)    hydrOXYzine (ATARAX) 10 MG tablet [id number] Yes TAKE 1 TABLET BY MOUTH THREE TIMES A DAY AS NEEDED Ivonne Andrew, NP Taking Active   hyoscyamine (LEVSIN/SL) 0.125 MG SL tablet [id number] No Place 1 tablet (0.125 mg total) under the tongue 4 (four) times daily as needed for up to 5 days.  Patient not taking: Reported on 06/24/2023   Nira Conn, MD Not Taking Expired 06/20/23 2359   metoprolol tartrate (LOPRESSOR) 50 MG tablet [id number] Yes TAKE 1 TABLET BY MOUTH TWICE A DAY Ivonne Andrew, NP Taking Active   Multiple Vitamin (MULTIVITAMIN WITH MINERALS) TABS tablet [id number] Yes Take 1 tablet by mouth daily.  Patient taking differently: Take 1 tablet by mouth daily. Centrum   Rai, Delene Ruffini, MD Taking Active   naloxone [facility]) nasal spray 4 mg/0.1 mL [id number] Yes Place 1 spray into the nose once. [provider] Taking Active   NUCYNTA 100 MG TABS [id number] Yes Take 2 tablets (200 mg total) by mouth daily in the afternoon.  Patient taking  differently: Take 100 mg by mouth daily in the afternoon. Afternoon   Medina-Vargas, Monina C, NP Taking Active   NUCYNTA ER 250 MG TB12 [id number] Yes Take 250 mg by mouth 2 (two) times daily. [provider] Taking Active            Med Note Worthy Rancher, Zettie Pho   Fri Dec 22, 2021 12:39 PM)    nystatin (MYCOSTATIN/NYSTOP) powder [id number] Yes Apply topically 3 (three) times daily. Leatha Gilding, MD Taking Active   pantoprazole (PROTONIX) 40 MG tablet [id number] Yes TAKE 1  TABLET BY MOUTH EVERY DAY Ivonne Andrew, NP Taking Active   saccharomyces boulardii (FLORASTOR) 250 MG capsule [id number] Yes Take 1 capsule (250 mg total) by mouth 2 (two) times daily for 14 days. Leatha Gilding, MD Taking Active   TRINTELLIX 20 MG TABS tablet [id number] Yes Take 20 mg by mouth daily. [provider] Taking Active Family Member  VRAYLAR 6 MG CAPS [id number] Yes Take 6 mg by mouth daily. [provider] Taking Active Family Member           Med Note Jomarie Longs, REGEENA   Fri Dec 22, 2021  2:08 PM)              Home Care and Equipment/Supplies: Were Home Health Services Ordered?: No Any new equipment or medical supplies ordered?: No  Functional Questionnaire: Do you need assistance with bathing/showering or dressing?: No Do you need assistance with meal preparation?: No Do you need assistance with eating?: No Do you have difficulty maintaining continence: No Do you need assistance with getting out of bed/getting out of a chair/moving?: No Do you have difficulty managing or taking your medications?: No  Follow up appointments reviewed: PCP Follow-up appointment confirmed?: Yes Date of PCP follow-up appointment?: 07/03/23 Follow-up Provider: [facility] Follow-up appointment confirmed?: NA Do you need transportation to your follow-up appointment?: No Do you understand care options if your condition(s) worsen?: Yes-patient verbalized understanding    SIGNATURE  Woodfin Ganja LPN [facility] Nurse Health Advisor Direct Dial [phone]

## 2023-07-03 ENCOUNTER — Inpatient Hospital Stay: Payer: Self-pay | Admitting: Nurse Practitioner

## 2023-08-01 ENCOUNTER — Other Ambulatory Visit: Payer: Self-pay | Admitting: Nurse Practitioner

## 2023-08-27 ENCOUNTER — Other Ambulatory Visit: Payer: Self-pay | Admitting: Nurse Practitioner

## 2023-08-27 DIAGNOSIS — I10 Essential (primary) hypertension: Secondary | ICD-10-CM

## 2023-08-27 DIAGNOSIS — K219 Gastro-esophageal reflux disease without esophagitis: Secondary | ICD-10-CM

## 2023-09-05 ENCOUNTER — Telehealth: Payer: Self-pay

## 2023-09-05 NOTE — Telephone Encounter (Signed)
[facility] is requesting to fill pt nystatin cream please advise Kh

## 2023-09-06 ENCOUNTER — Other Ambulatory Visit: Payer: Self-pay | Admitting: Nurse Practitioner

## 2023-09-06 MED ORDER — NYSTATIN 100000 UNIT/GM EX POWD: Freq: Three times a day (TID) | CUTANEOUS | 0 refills | Status: DC

## 2023-09-13 ENCOUNTER — Ambulatory Visit: Payer: BC Managed Care – PPO | Admitting: Nurse Practitioner

## 2023-09-23 ENCOUNTER — Emergency Department (HOSPITAL_COMMUNITY): Payer: BC Managed Care – PPO

## 2023-09-23 ENCOUNTER — Encounter (HOSPITAL_COMMUNITY): Payer: Self-pay

## 2023-09-23 ENCOUNTER — Inpatient Hospital Stay (HOSPITAL_COMMUNITY)
Admission: EM | Admit: 2023-09-23 | Discharge: 2023-09-28 | DRG: 872 | Disposition: A | Discharge status: Home Health | Payer: BC Managed Care – PPO | Attending: Internal Medicine | Admitting: Internal Medicine

## 2023-09-23 ENCOUNTER — Other Ambulatory Visit: Payer: Self-pay

## 2023-09-23 DIAGNOSIS — Z880 Allergy status to penicillin: Secondary | ICD-10-CM

## 2023-09-23 DIAGNOSIS — C642 Malignant neoplasm of left kidney, except renal pelvis: Secondary | ICD-10-CM | POA: Diagnosis present

## 2023-09-23 DIAGNOSIS — G894 Chronic pain syndrome: Secondary | ICD-10-CM | POA: Diagnosis present

## 2023-09-23 DIAGNOSIS — Z6841 Body Mass Index (BMI) 40.0 and over, adult: Secondary | ICD-10-CM | POA: Diagnosis not present

## 2023-09-23 DIAGNOSIS — Z885 Allergy status to narcotic agent status: Secondary | ICD-10-CM

## 2023-09-23 DIAGNOSIS — R300 Dysuria: Secondary | ICD-10-CM | POA: Diagnosis present

## 2023-09-23 DIAGNOSIS — G4733 Obstructive sleep apnea (adult) (pediatric): Secondary | ICD-10-CM | POA: Diagnosis present

## 2023-09-23 DIAGNOSIS — K529 Noninfective gastroenteritis and colitis, unspecified: Secondary | ICD-10-CM | POA: Diagnosis not present

## 2023-09-23 DIAGNOSIS — E876 Hypokalemia: Secondary | ICD-10-CM | POA: Diagnosis present

## 2023-09-23 DIAGNOSIS — M1990 Unspecified osteoarthritis, unspecified site: Secondary | ICD-10-CM | POA: Diagnosis present

## 2023-09-23 DIAGNOSIS — I10 Essential (primary) hypertension: Secondary | ICD-10-CM | POA: Diagnosis present

## 2023-09-23 DIAGNOSIS — Z79899 Other long term (current) drug therapy: Secondary | ICD-10-CM

## 2023-09-23 DIAGNOSIS — N289 Disorder of kidney and ureter, unspecified: Secondary | ICD-10-CM | POA: Diagnosis not present

## 2023-09-23 DIAGNOSIS — R1114 Bilious vomiting: Secondary | ICD-10-CM

## 2023-09-23 DIAGNOSIS — Z8249 Family history of ischemic heart disease and other diseases of the circulatory system: Secondary | ICD-10-CM | POA: Diagnosis not present

## 2023-09-23 DIAGNOSIS — F319 Bipolar disorder, unspecified: Secondary | ICD-10-CM | POA: Diagnosis present

## 2023-09-23 DIAGNOSIS — A0811 Acute gastroenteropathy due to Norwalk agent: Secondary | ICD-10-CM | POA: Diagnosis present

## 2023-09-23 DIAGNOSIS — R197 Diarrhea, unspecified: Secondary | ICD-10-CM

## 2023-09-23 DIAGNOSIS — N179 Acute kidney failure, unspecified: Secondary | ICD-10-CM | POA: Diagnosis present

## 2023-09-23 DIAGNOSIS — E872 Acidosis, unspecified: Secondary | ICD-10-CM | POA: Diagnosis present

## 2023-09-23 DIAGNOSIS — Z882 Allergy status to sulfonamides status: Secondary | ICD-10-CM | POA: Diagnosis not present

## 2023-09-23 DIAGNOSIS — I4510 Unspecified right bundle-branch block: Secondary | ICD-10-CM | POA: Diagnosis present

## 2023-09-23 DIAGNOSIS — F419 Anxiety disorder, unspecified: Secondary | ICD-10-CM | POA: Diagnosis present

## 2023-09-23 DIAGNOSIS — R112 Nausea with vomiting, unspecified: Secondary | ICD-10-CM | POA: Diagnosis present

## 2023-09-23 DIAGNOSIS — Z79891 Long term (current) use of opiate analgesic: Secondary | ICD-10-CM

## 2023-09-23 DIAGNOSIS — A419 Sepsis, unspecified organism: Principal | ICD-10-CM | POA: Diagnosis present

## 2023-09-23 DIAGNOSIS — G47419 Narcolepsy without cataplexy: Secondary | ICD-10-CM | POA: Diagnosis present

## 2023-09-23 DIAGNOSIS — K219 Gastro-esophageal reflux disease without esophagitis: Secondary | ICD-10-CM | POA: Diagnosis present

## 2023-09-23 DIAGNOSIS — Z9109 Other allergy status, other than to drugs and biological substances: Secondary | ICD-10-CM

## 2023-09-23 LAB — COMPREHENSIVE METABOLIC PANEL
ALT: 18 U/L (ref 0–44)
AST: 23 U/L (ref 15–41)
Albumin: 5.1 g/dL — ABNORMAL HIGH (ref 3.5–5.0)
Alkaline Phosphatase: 118 U/L (ref 38–126)
Anion gap: 17 — ABNORMAL HIGH (ref 5–15)
BUN: 9 mg/dL (ref 6–20)
CO2: 24 mmol/L (ref 22–32)
Calcium: 9.8 mg/dL (ref 8.9–10.3)
Chloride: 100 mmol/L (ref 98–111)
Creatinine, Ser: 0.86 mg/dL (ref 0.44–1.00)
GFR, Estimated: >60 mL/min (ref >60)
Glucose, Bld: 104 mg/dL — ABNORMAL HIGH (ref 70–99)
Potassium: 3.5 mmol/L (ref 3.5–5.1)
Sodium: 141 mmol/L (ref 135–145)
Total Bilirubin: 1 mg/dL (ref 0.3–1.2)
Total Protein: 9.2 g/dL — ABNORMAL HIGH (ref 6.5–8.1)

## 2023-09-23 LAB — CBC
HCT: 46.9 % — ABNORMAL HIGH (ref 36.0–46.0)
Hemoglobin: 15.6 g/dL — ABNORMAL HIGH (ref 12.0–15.0)
MCH: 28.5 pg (ref 26.0–34.0)
MCHC: 33.3 g/dL (ref 30.0–36.0)
MCV: 85.6 fL (ref 80.0–100.0)
Platelets: 361 10*3/uL (ref 150–400)
RBC: 5.48 MIL/uL — ABNORMAL HIGH (ref 3.87–5.11)
RDW: 12.2 % (ref 11.5–15.5)
WBC: 13.5 10*3/uL — ABNORMAL HIGH (ref 4.0–10.5)
nRBC: 0 % (ref 0.0–0.2)

## 2023-09-23 LAB — LIPASE, BLOOD: Lipase: 50 U/L (ref 11–51)

## 2023-09-23 LAB — HCG, SERUM, QUALITATIVE: Preg, Serum: NEGATIVE

## 2023-09-23 MED ORDER — ONDANSETRON HCL 4 MG/2ML IJ SOLN
4.0000 mg | Freq: Once | INTRAMUSCULAR | Status: AC
  Administered 2023-09-24: 4 mg via INTRAVENOUS

## 2023-09-23 MED ORDER — DIAZEPAM 5 MG/ML IJ SOLN
2.5000 mg | Freq: Once | INTRAMUSCULAR | Status: DC
  Filled 2023-09-23: qty 2

## 2023-09-23 MED ORDER — GABAPENTIN 400 MG PO CAPS
400.0000 mg | ORAL_CAPSULE | ORAL | Status: AC
  Administered 2023-09-23: 400 mg via ORAL
  Filled 2023-09-23: qty 1

## 2023-09-23 MED ORDER — SODIUM CHLORIDE 0.9 % IV SOLN
12.5000 mg | Freq: Four times a day (QID) | INTRAVENOUS | Status: DC | PRN
  Administered 2023-09-24: 12.5 mg via INTRAVENOUS
  Filled 2023-09-23: qty 0.5
  Filled 2023-09-23: qty 12.5

## 2023-09-23 MED ORDER — ONDANSETRON HCL 4 MG/2ML IJ SOLN
4.0000 mg | Freq: Four times a day (QID) | INTRAMUSCULAR | Status: DC | PRN
  Administered 2023-09-24 – 2023-09-26 (×5): 4 mg via INTRAVENOUS
  Filled 2023-09-23 (×6): qty 2

## 2023-09-23 MED ORDER — DROPERIDOL 2.5 MG/ML IJ SOLN
1.2500 mg | Freq: Once | INTRAMUSCULAR | Status: AC
  Administered 2023-09-23: 1.25 mg via INTRAVENOUS
  Filled 2023-09-23: qty 2

## 2023-09-23 MED ORDER — LACTATED RINGERS IV BOLUS
1000.0000 mL | Freq: Once | INTRAVENOUS | Status: AC
  Administered 2023-09-23: 1000 mL via INTRAVENOUS

## 2023-09-23 MED ORDER — SODIUM CHLORIDE (PF) 0.9 % IJ SOLN
INTRAMUSCULAR | Status: AC
  Filled 2023-09-23: qty 50

## 2023-09-23 MED ORDER — IOHEXOL 300 MG/ML  SOLN
100.0000 mL | Freq: Once | INTRAMUSCULAR | Status: AC | PRN
  Administered 2023-09-23: 100 mL via INTRAVENOUS

## 2023-09-23 MED ORDER — ONDANSETRON HCL 4 MG/2ML IJ SOLN
4.0000 mg | Freq: Once | INTRAMUSCULAR | Status: AC
  Administered 2023-09-23: 4 mg via INTRAVENOUS
  Filled 2023-09-23: qty 2

## 2023-09-23 MED ORDER — DIPHENHYDRAMINE HCL 50 MG/ML IJ SOLN
25.0000 mg | Freq: Once | INTRAMUSCULAR | Status: AC
  Administered 2023-09-23: 25 mg via INTRAVENOUS
  Filled 2023-09-23: qty 1

## 2023-09-23 NOTE — ED Provider Notes (Signed)
[facility] Provider Note   CSN: [id number] Arrival date & time: 09/23/23  0935     History {Add pertinent medical, surgical, social history, OB history to HPI:1} Chief Complaint  Patient presents with   Nausea   Vomiting   Diarrhea    Holly Roberts is a 57 y.o. female.  57 year old female with a history of bipolar, anxiety, depression, pain, and chronic diarrhea who presents to the emergency department with abdominal pain, nausea, vomiting, and diarrhea.  Patient states that for the past 2 days has had an innumerable amount of episodes of vomiting and diarrhea.  Nonbloody nonbilious.  No melena or hematochezia.  Also has been having 10/10 abdominal pain that is constant and sharp in her suprapubic area.  No exacerbating or alleviating factors.  Has not yet tried any medicine for the pain or nausea.  Is having some dysuria as well at this time.  No fevers.  Had a cholecystectomy but no other abdominal surgeries.  Not sexually active.  No concern for STIs.  No vaginal bleeding.       Home Medications Prior to Admission medications   Medication Sig Start Date End Date Taking? Authorizing Provider  amLODipine (NORVASC) 10 MG tablet TAKE 1 TABLET BY MOUTH EVERY DAY 08/28/23   Ivonne Andrew, NP  Armodafinil 250 MG tablet Take 250 mg by mouth every morning. 04/09/21   [provider]  busPIRone (BUSPAR) 10 MG tablet TAKE 1 TABLET(10 MG) BY MOUTH TWICE DAILY 08/01/23   Ivonne Andrew, NP  clobetasol cream (TEMOVATE) 0.05 % Apply 1 Application topically 2 (two) times daily. 06/21/23   Leatha Gilding, MD  Daridorexant HCl (QUVIVIQ) 50 MG TABS Take 50 mg by mouth at bedtime.    [provider]  doxepin (SINEQUAN) 100 MG capsule Take 100 mg by mouth at bedtime.    [provider]  folic acid (FOLVITE) 1 MG tablet TAKE 1 TABLET BY MOUTH EVERY DAY 08/28/23   Ivonne Andrew, NP  HORIZANT 600 MG TBCR Take 600-1,200 mg by  mouth in the morning and at bedtime. Take 1 tablet (600 mg) in the morning and Take 2 tablets (1200 mg) at bedtime 05/29/21   [provider]  hydrOXYzine (ATARAX) 10 MG tablet TAKE 1 TABLET BY MOUTH THREE TIMES A DAY AS NEEDED 12/14/22   Ivonne Andrew, NP  hyoscyamine (LEVSIN/SL) 0.125 MG SL tablet Place 1 tablet (0.125 mg total) under the tongue 4 (four) times daily as needed for up to 5 days. Patient not taking: Reported on 06/24/2023 06/15/23 06/20/23  Nira Conn, MD  metoprolol tartrate (LOPRESSOR) 50 MG tablet TAKE 1 TABLET BY MOUTH TWICE DAILY 08/28/23   Ivonne Andrew, NP  Multiple Vitamin (MULTIVITAMIN WITH MINERALS) TABS tablet Take 1 tablet by mouth daily. Patient taking differently: Take 1 tablet by mouth daily. Centrum 08/11/21   Rai, Delene Ruffini, MD  naloxone [facility]) nasal spray 4 mg/0.1 mL Place 1 spray into the nose once. 04/29/23   [provider]  NUCYNTA 100 MG TABS Take 2 tablets (200 mg total) by mouth daily in the afternoon. Patient taking differently: Take 100 mg by mouth daily in the afternoon. Afternoon 08/11/21   Medina-Vargas, Monina C, NP  NUCYNTA ER 250 MG TB12 Take 250 mg by mouth 2 (two) times daily. 12/07/21   [provider]  nystatin (MYCOSTATIN/NYSTOP) powder Apply topically 3 (three) times daily. 09/06/23   Ivonne Andrew, NP  pantoprazole (PROTONIX) 40 MG tablet TAKE 1 TABLET BY MOUTH EVERY DAY 08/28/23   Ivonne Andrew, NP  TRINTELLIX 20 MG TABS tablet Take 20 mg by mouth daily. 06/08/21   [provider]  VRAYLAR 6 MG CAPS Take 6 mg by mouth daily. 07/11/21   [provider]      Allergies    Buprenorphine, Amoxicillin-pot clavulanate, Wound dressing adhesive, and Sulfa antibiotics    Review of Systems   Review of Systems  Physical Exam Updated Vital Signs BP (!) 161/115 (BP Location: Left Arm)   Pulse 100   Temp 98.8 F (37.1 C) (Oral)   Resp 16   Ht 5\' 4"  (1.626 m)   Wt 115 kg   SpO2 99%    BMI 43.52 kg/m  Physical Exam Vitals and nursing note reviewed.  Constitutional:      General: She is not in acute distress.    Appearance: She is well-developed.     Comments: Uncomfortable appearing holding an emesis basin  HENT:     Head: Normocephalic and atraumatic.     Right Ear: External ear normal.     Left Ear: External ear normal.     Nose: Nose normal.  Eyes:     Extraocular Movements: Extraocular movements intact.     Conjunctiva/sclera: Conjunctivae normal.     Pupils: Pupils are equal, round, and reactive to light.  Pulmonary:     Effort: Pulmonary effort is normal. No respiratory distress.  Abdominal:     General: Abdomen is flat. There is no distension.     Palpations: Abdomen is soft. There is no mass.     Tenderness: There is abdominal tenderness (Suprapubic). There is no guarding.  Musculoskeletal:     Cervical back: Normal range of motion and neck supple.     Right lower leg: No edema.     Left lower leg: No edema.  Skin:    General: Skin is warm and dry.  Neurological:     Mental Status: She is alert and oriented to person, place, and time. Mental status is at baseline.  Psychiatric:        Mood and Affect: Mood normal.     ED Results / Procedures / Treatments   Labs (all labs ordered are listed, but only abnormal results are displayed) Labs Reviewed  COMPREHENSIVE METABOLIC PANEL - Abnormal; Notable for the following components:      Result Value   Glucose, Bld 104 (*)    Total Protein 9.2 (*)    Albumin 5.1 (*)    Anion gap 17 (*)    All other components within normal limits  CBC - Abnormal; Notable for the following components:   WBC 13.5 (*)    RBC 5.48 (*)    Hemoglobin 15.6 (*)    HCT 46.9 (*)    All other components within normal limits  LIPASE, BLOOD  HCG, SERUM, QUALITATIVE  URINALYSIS, ROUTINE W REFLEX MICROSCOPIC    EKG None  Radiology No results found.  Procedures Procedures  {Document cardiac monitor, telemetry  assessment procedure when appropriate:1}  Medications Ordered in ED Medications - No data to display  ED Course/ Medical Decision Making/ A&P   {   Click here for ABCD2, HEART and other calculatorsREFRESH Note before signing :1}                              Medical Decision Making Amount and/or  Complexity of Data Reviewed Labs: ordered. Radiology: ordered.  Risk Prescription drug management.   ***  {Document critical care time when appropriate:1} {Document review of labs and clinical decision tools ie heart score, Chads2Vasc2 etc:1}  {Document your independent review of radiology images, and any outside records:1} {Document your discussion with family members, caretakers, and with consultants:1} {Document social determinants of health affecting pt's care:1} {Document your decision making why or why not admission, treatments were needed:1} Final Clinical Impression(s) / ED Diagnoses Final diagnoses:  None    Rx / DC Orders ED Discharge Orders     None

## 2023-09-23 NOTE — ED Provider Notes (Signed)
Patient was received a signout from Dr. Jarold Motto pending admission to the hospitalist.  She was admitted at 940 to the hospitalist.  I discussed with early ED provider as well as the hospitalist and necessity of repeating GI stool studies.  We felt it was appropriate to repeat her studies as she was positive for E. coli 2 months ago, which necessitated treatment with antibiotics, and likewise if she is positive again would need antibiotic treatment.  Stool studies have been ordered   Terald Sleeper, MD 09/23/23 2143

## 2023-09-23 NOTE — ED Triage Notes (Signed)
Pt biba for N/V/D x2 days. Denies any other symptoms.

## 2023-09-23 NOTE — ED Notes (Signed)
Patient cannot urinate at this time.

## 2023-09-24 ENCOUNTER — Other Ambulatory Visit: Payer: Self-pay

## 2023-09-24 DIAGNOSIS — N289 Disorder of kidney and ureter, unspecified: Secondary | ICD-10-CM

## 2023-09-24 DIAGNOSIS — E785 Hyperlipidemia, unspecified: Secondary | ICD-10-CM | POA: Insufficient documentation

## 2023-09-24 DIAGNOSIS — E872 Acidosis, unspecified: Secondary | ICD-10-CM

## 2023-09-24 DIAGNOSIS — K529 Noninfective gastroenteritis and colitis, unspecified: Principal | ICD-10-CM | POA: Diagnosis present

## 2023-09-24 DIAGNOSIS — I10 Essential (primary) hypertension: Secondary | ICD-10-CM

## 2023-09-24 DIAGNOSIS — A419 Sepsis, unspecified organism: Secondary | ICD-10-CM | POA: Diagnosis not present

## 2023-09-24 DIAGNOSIS — F319 Bipolar disorder, unspecified: Secondary | ICD-10-CM | POA: Diagnosis not present

## 2023-09-24 DIAGNOSIS — N179 Acute kidney failure, unspecified: Secondary | ICD-10-CM

## 2023-09-24 DIAGNOSIS — R112 Nausea with vomiting, unspecified: Secondary | ICD-10-CM

## 2023-09-24 LAB — CBC
HCT: 42 % (ref 36.0–46.0)
Hemoglobin: 13.5 g/dL (ref 12.0–15.0)
MCH: 28.5 pg (ref 26.0–34.0)
MCHC: 32.1 g/dL (ref 30.0–36.0)
MCV: 88.6 fL (ref 80.0–100.0)
Platelets: 275 10*3/uL (ref 150–400)
RBC: 4.74 MIL/uL (ref 3.87–5.11)
RDW: 12.5 % (ref 11.5–15.5)
WBC: 14.9 10*3/uL — ABNORMAL HIGH (ref 4.0–10.5)
nRBC: 0 % (ref 0.0–0.2)

## 2023-09-24 LAB — URINALYSIS, ROUTINE W REFLEX MICROSCOPIC
Bacteria, UA: NONE SEEN
Bilirubin Urine: NEGATIVE
Glucose, UA: NEGATIVE mg/dL
Ketones, ur: 80 mg/dL — AB
Nitrite: NEGATIVE
Protein, ur: 30 mg/dL — AB
Specific Gravity, Urine: 1.041 — ABNORMAL HIGH (ref 1.005–1.030)
pH: 6 (ref 5.0–8.0)

## 2023-09-24 LAB — BASIC METABOLIC PANEL
Anion gap: 19 — ABNORMAL HIGH (ref 5–15)
BUN: 9 mg/dL (ref 6–20)
CO2: 22 mmol/L (ref 22–32)
Calcium: 9.4 mg/dL (ref 8.9–10.3)
Chloride: 101 mmol/L (ref 98–111)
Creatinine, Ser: 0.87 mg/dL (ref 0.44–1.00)
GFR, Estimated: >60 mL/min (ref >60)
Glucose, Bld: 86 mg/dL (ref 70–99)
Potassium: 3.2 mmol/L — ABNORMAL LOW (ref 3.5–5.1)
Sodium: 142 mmol/L (ref 135–145)

## 2023-09-24 LAB — MAGNESIUM: Magnesium: 1.9 mg/dL (ref 1.7–2.4)

## 2023-09-24 MED ORDER — HYDROMORPHONE HCL 1 MG/ML IJ SOLN
0.5000 mg | INTRAMUSCULAR | Status: DC | PRN
  Administered 2023-09-24 – 2023-09-28 (×20): 0.5 mg via INTRAVENOUS
  Filled 2023-09-24 (×21): qty 0.5

## 2023-09-24 MED ORDER — ACETAMINOPHEN 325 MG PO TABS
650.0000 mg | ORAL_TABLET | Freq: Once | ORAL | Status: DC
  Filled 2023-09-24: qty 2

## 2023-09-24 MED ORDER — AMLODIPINE BESYLATE 10 MG PO TABS
10.0000 mg | ORAL_TABLET | Freq: Every day | ORAL | Status: DC
  Administered 2023-09-24 – 2023-09-28 (×5): 10 mg via ORAL
  Filled 2023-09-24 (×3): qty 2
  Filled 2023-09-24: qty 1
  Filled 2023-09-24: qty 2

## 2023-09-24 MED ORDER — LACTATED RINGERS IV SOLN: INTRAVENOUS | Status: AC

## 2023-09-24 MED ORDER — OXYCODONE HCL 5 MG PO TABS
5.0000 mg | ORAL_TABLET | Freq: Once | ORAL | Status: AC | PRN
  Administered 2023-09-24: 5 mg via ORAL
  Filled 2023-09-24: qty 1

## 2023-09-24 MED ORDER — FLUCONAZOLE 150 MG PO TABS
150.0000 mg | ORAL_TABLET | Freq: Every day | ORAL | Status: AC
  Administered 2023-09-24 – 2023-09-26 (×3): 150 mg via ORAL
  Filled 2023-09-24 (×3): qty 1

## 2023-09-24 MED ORDER — NYSTATIN 100000 UNIT/GM EX POWD
Freq: Three times a day (TID) | CUTANEOUS | Status: DC
  Filled 2023-09-24: qty 15

## 2023-09-24 MED ORDER — ENOXAPARIN SODIUM 60 MG/0.6ML IJ SOSY
55.0000 mg | PREFILLED_SYRINGE | INTRAMUSCULAR | Status: DC
  Administered 2023-09-24 – 2023-09-28 (×5): 55 mg via SUBCUTANEOUS
  Filled 2023-09-24 (×5): qty 0.6

## 2023-09-24 MED ORDER — GABAPENTIN ENACARBIL ER 600 MG PO TBCR: 600.0000 mg | EXTENDED_RELEASE_TABLET | Freq: Two times a day (BID) | ORAL | Status: DC

## 2023-09-24 MED ORDER — GABAPENTIN 400 MG PO CAPS
1200.0000 mg | ORAL_CAPSULE | Freq: Every day | ORAL | Status: DC
  Administered 2023-09-24 – 2023-09-27 (×4): 1200 mg via ORAL
  Filled 2023-09-24 (×4): qty 3

## 2023-09-24 MED ORDER — METOPROLOL TARTRATE 50 MG PO TABS
50.0000 mg | ORAL_TABLET | Freq: Two times a day (BID) | ORAL | Status: DC
  Administered 2023-09-24 – 2023-09-28 (×10): 50 mg via ORAL
  Filled 2023-09-24 (×5): qty 1
  Filled 2023-09-24: qty 2
  Filled 2023-09-24 (×3): qty 1
  Filled 2023-09-24: qty 2

## 2023-09-24 MED ORDER — VORTIOXETINE HBR 5 MG PO TABS
20.0000 mg | ORAL_TABLET | Freq: Every day | ORAL | Status: DC
  Administered 2023-09-24 – 2023-09-28 (×5): 20 mg via ORAL
  Filled 2023-09-24 (×5): qty 4

## 2023-09-24 MED ORDER — GABAPENTIN 300 MG PO CAPS
600.0000 mg | ORAL_CAPSULE | Freq: Every day | ORAL | Status: DC
  Administered 2023-09-24 – 2023-09-28 (×5): 600 mg via ORAL
  Filled 2023-09-24 (×5): qty 2

## 2023-09-24 MED ORDER — POTASSIUM CHLORIDE 10 MEQ/100ML IV SOLN
10.0000 meq | INTRAVENOUS | Status: AC
  Administered 2023-09-24 (×3): 10 meq via INTRAVENOUS
  Filled 2023-09-24 (×3): qty 100

## 2023-09-24 MED ORDER — CARIPRAZINE HCL 1.5 MG PO CAPS
6.0000 mg | ORAL_CAPSULE | Freq: Every day | ORAL | Status: DC
  Administered 2023-09-24 – 2023-09-28 (×5): 6 mg via ORAL
  Filled 2023-09-24 (×5): qty 4

## 2023-09-24 MED ORDER — GABAPENTIN 300 MG PO CAPS
600.0000 mg | ORAL_CAPSULE | Freq: Once | ORAL | Status: AC
  Administered 2023-09-24: 600 mg via ORAL
  Filled 2023-09-24: qty 2

## 2023-09-24 NOTE — Assessment & Plan Note (Signed)
-  BMI of 43 

## 2023-09-24 NOTE — Assessment & Plan Note (Addendum)
-  stable mood -continue Trintellix and Vraylar

## 2023-09-24 NOTE — ED Notes (Signed)
Pt incontinent of stool. Peri care performed and new brief placed.

## 2023-09-24 NOTE — Assessment & Plan Note (Signed)
-  9 mm mildly hyperdense lesion in the left kidney, too small to characterize but stable from 06/14/2023. Renal cell carcinoma cannot be excluded. Further evaluation with nonemergent pre and post contrast MRI should be considered.

## 2023-09-24 NOTE — Assessment & Plan Note (Addendum)
-  anion gap of 17  -keep on continuous IV fluid

## 2023-09-24 NOTE — Assessment & Plan Note (Addendum)
-  secondary to gastroenteritis with intractable nausea, vomiting and diarrhea -C.diff and stool testing pending-holding on antidiarrheal pending results -PRN antiemetics  -continuous IV fluid -keep NPO for bowel rest and can advance as tolerated

## 2023-09-24 NOTE — H&P (Signed)
History and Physical    Patient: Holly Roberts DVV:[id number] DOB: 09/08/1966 DOA: 09/23/2023 DOS: the patient was seen and examined on 09/24/2023 PCP: Ivonne Andrew, NP  Patient coming from: Home  Chief Complaint:  Chief Complaint  Patient presents with   Nausea   Vomiting   Diarrhea   HPI: Holly Roberts is a 57 y.o. female with medical history significant of HTN, migraine, OSA, primary narcolepsy with catalepsy, chronic pain syndrome, chronic diarrhea since gallbladder surgery, Bipolar disorder, anxiety and depression who presents with intractable nausea and vomiting.   Had repeated nausea and vomiting yesterday after she had [place]. Felt chills but no objective fever. Has diffuse abdominal pain. She has chronic diarrhea following cholecystectomy and normally takes a powder to help with this but had diarrhea all day today.  Has not been able to take any of her medications for 2 days.   Last hospitalized in July with similar symptoms and had gastroenteritis caused by enteropathogenic E. Coli and norovirus. She was treated with IV fluids, bowel rest and ultimately placed on azithromycin with subsequent improvement in her diarrhea.  On arrival to ED, she was afebrile, HR of 131, BP of 161/115 on room air.  EKG on my review in sinus tachycardia with incomplete RBBB and repolarization abnormalities.   CBC with leukocytosis 13.5K, hgb of 15 compare to baseline of 13.   CMP with BG of 104, Creatinine of 0.86, anion gap of 17.  CT Abd/pelvis with contrast with no acute process to explain symptoms.     Review of Systems: As mentioned in the history of present illness. All other systems reviewed and are negative. Past Medical History:  Diagnosis Date   Anxiety    Arthritis    Bipolar disorder (HCC)    Chronic pain    Depression    GERD (gastroesophageal reflux disease)    Hypertension    patient denies   Past Surgical History:  Procedure Laterality Date   CHOLECYSTECTOMY N/A  04/20/2021   Procedure: LAPAROSCOPIC CHOLECYSTECTOMY WITH INTRAOPERATIVE CHOLANGIOGRAM;  Surgeon: [name], MD;  Location: [facility] ORS;  Service: General;  Laterality: N/A;   Right knee surgery     TONSILLECTOMY     Social History:  reports that she has never smoked. She has never used smokeless tobacco. She reports current alcohol use. She reports that she does not use drugs.  Allergies  Allergen Reactions   Buprenorphine Rash    Patch gave rash- poss adhesive issues. Tried generic and brand name both. Patient states: "I am not allergic to the medication. I am allergic to the adhesive, not the medication."    Amoxicillin-Pot Clavulanate     Other reaction(s): Other (See Comments) States "strong" antibiotics cause C diff   Wound Dressing Adhesive     Rash   Sulfa Antibiotics Rash    Family History  Problem Relation Age of Onset   Diverticulitis Mother    Autoimmune disease Mother        heptatic   Heart attack Mother        x2   Cancer Father    Pancreatic cancer Maternal Grandmother    Ulcerative colitis Maternal Grandfather    Colon cancer Neg Hx    Esophageal cancer Neg Hx     Prior to Admission medications   Medication Sig Start Date End Date Taking? Authorizing Provider  amLODipine (NORVASC) 10 MG tablet TAKE 1 TABLET BY MOUTH EVERY DAY 08/28/23   Ivonne Andrew, NP  Armodafinil 250 MG  tablet Take 250 mg by mouth every morning. 04/09/21   [provider]  busPIRone (BUSPAR) 10 MG tablet TAKE 1 TABLET(10 MG) BY MOUTH TWICE DAILY 08/01/23   Ivonne Andrew, NP  clobetasol cream (TEMOVATE) 0.05 % Apply 1 Application topically 2 (two) times daily. 06/21/23   Leatha Gilding, MD  Daridorexant HCl (QUVIVIQ) 50 MG TABS Take 50 mg by mouth at bedtime.    [provider]  doxepin (SINEQUAN) 100 MG capsule Take 100 mg by mouth at bedtime.    [provider]  folic acid (FOLVITE) 1 MG tablet TAKE 1 TABLET BY MOUTH EVERY DAY 08/28/23   Ivonne Andrew,  NP  HORIZANT 600 MG TBCR Take 600-1,200 mg by mouth in the morning and at bedtime. Take 1 tablet (600 mg) in the morning and Take 2 tablets (1200 mg) at bedtime 05/29/21   [provider]  hydrOXYzine (ATARAX) 10 MG tablet TAKE 1 TABLET BY MOUTH THREE TIMES A DAY AS NEEDED 12/14/22   Ivonne Andrew, NP  hyoscyamine (LEVSIN/SL) 0.125 MG SL tablet Place 1 tablet (0.125 mg total) under the tongue 4 (four) times daily as needed for up to 5 days. Patient not taking: Reported on 06/24/2023 06/15/23 06/20/23  Nira Conn, MD  metoprolol tartrate (LOPRESSOR) 50 MG tablet TAKE 1 TABLET BY MOUTH TWICE DAILY 08/28/23   Ivonne Andrew, NP  Multiple Vitamin (MULTIVITAMIN WITH MINERALS) TABS tablet Take 1 tablet by mouth daily. Patient taking differently: Take 1 tablet by mouth daily. Centrum 08/11/21   Rai, Delene Ruffini, MD  naloxone [facility]) nasal spray 4 mg/0.1 mL Place 1 spray into the nose once. 04/29/23   [provider]  NUCYNTA 100 MG TABS Take 2 tablets (200 mg total) by mouth daily in the afternoon. Patient taking differently: Take 100 mg by mouth daily in the afternoon. Afternoon 08/11/21   Medina-Vargas, Monina C, NP  NUCYNTA ER 250 MG TB12 Take 250 mg by mouth 2 (two) times daily. 12/07/21   [provider]  nystatin (MYCOSTATIN/NYSTOP) powder Apply topically 3 (three) times daily. 09/06/23   Ivonne Andrew, NP  pantoprazole (PROTONIX) 40 MG tablet TAKE 1 TABLET BY MOUTH EVERY DAY 08/28/23   Ivonne Andrew, NP  TRINTELLIX 20 MG TABS tablet Take 20 mg by mouth daily. 06/08/21   [provider]  VRAYLAR 6 MG CAPS Take 6 mg by mouth daily. 07/11/21   [provider]    Physical Exam: Vitals:   09/23/23 2035 09/23/23 2200 09/24/23 0100 09/24/23 0135  BP: (!) 152/90 (!) 155/98 (!) 178/82   Pulse: (!) 128 (!) 131 (!) 123   Resp: 17 (!) 30 (!) 31   Temp: 98.4 F (36.9 C)   98.6 F (37 C)  TempSrc: Oral     SpO2: 99% 97% 99%   Weight:      Height:        Constitutional: NAD, ill appearing but non-toxic obese female laying flat in bed Eyes:  lids and conjunctivae normal ENMT: Mucous membranes are moist.  Neck: normal, supple Respiratory: clear to auscultation bilaterally, no wheezing, no crackles. Normal respiratory effort. No accessory muscle use.  Cardiovascular: Regular rate and rhythm, no murmurs / rubs / gallops.  Abdomen: soft, obese abdomen, moderately diffusely tender Musculoskeletal: no clubbing / cyanosis. No joint deformity upper and lower extremities.  Skin: no rashes, lesions, ulcers. No induration Neurologic: CN 2-12 grossly intact.  Psychiatric: Normal judgment and insight. Alert and oriented  x 3. Normal mood.   Data Reviewed:  See HPI  Assessment and Plan: * Sepsis (HCC) -secondary to gastroenteritis with intractable nausea, vomiting and diarrhea -C.diff and stool testing pending-holding on antidiarrheal pending results -PRN antiemetics  -continuous IV fluid -keep NPO for bowel rest and can advance as tolerated  AKI (acute kidney injury) (HCC) Creatinine of 0.86 with baseline of 0.6-0.7 -keep on continuous IV fluid  Metabolic acidosis -anion gap of 17  -keep on continuous IV fluid  Bipolar disorder (HCC) -stable mood -continue Trintellix and Vraylar   Obesity, Class III, BMI 40-49.9 (morbid obesity) (HCC) -BMI of 43  Renal lesion -9 mm mildly hyperdense lesion in the left kidney, too small to characterize but stable from 06/14/2023. Renal cell carcinoma cannot be excluded. Further evaluation with nonemergent pre and post contrast MRI should be considered.  HTN (hypertension) -elevated due to missed medication from GI symptoms -resume amlodipine and metoprolol      Advance Care Planning:   Code Status: Full Code   Consults: none  Family Communication: none at bedside  Severity of Illness: The appropriate patient status for this patient is INPATIENT. Inpatient status is judged to be  reasonable and necessary in order to provide the required intensity of service to ensure the patient's safety. The patient's presenting symptoms, physical exam findings, and initial radiographic and laboratory data in the context of their chronic comorbidities is felt to place them at high risk for further clinical deterioration. Furthermore, it is not anticipated that the patient will be medically stable for discharge from the hospital within 2 midnights of admission.   * I certify that at the point of admission it is my clinical judgment that the patient will require inpatient hospital care spanning beyond 2 midnights from the point of admission due to high intensity of service, high risk for further deterioration and high frequency of surveillance required.*  Author: Anselm Jungling, DO 09/24/2023 1:53 AM  For on call review [email or web address].

## 2023-09-24 NOTE — ED Notes (Signed)
ED TO INPATIENT HANDOFF REPORT  Name/Age/Gender Holly Roberts 57 y.o. female  Code Status    Code Status Orders  (From admission, onward)           Start     Ordered   09/24/23 0140  Full code  Continuous       Question:  By:  Answer:  Consent: discussion documented in EHR   09/24/23 0140           Code Status History     Date Active Date Inactive Code Status Order ID Comments User Context   06/15/2023 2046 06/21/2023 2310 Full Code [id number]  Holly Grates, MD Inpatient   12/21/2021 2113 12/25/2021 1855 Full Code [id number]  Holly Giovanni, MD ED   07/30/2021 0827 08/11/2021 0246 Full Code [id number]  Holly Shan, MD ED   06/09/2021 1929 06/10/2021 2204 Full Code [id number]  Holly Brooking, MD Inpatient   04/19/2021 1632 04/23/2021 1620 Full Code [id number]  Holly Miyamoto, MD ED       Home/SNF/Other Home  Chief Complaint Intractable nausea and vomiting [R11.2]  Level of Care/Admitting Diagnosis ED Disposition     ED Disposition  Admit   Condition  --   Comment  Hospital Area: [facility] [[id number]]  Level of Care: Telemetry [5]  Admit to tele based on following criteria: Other see comments  Comments: rate  May admit patient to [facility] or [facility] if equivalent level of care is available:: No  Covid Evaluation: Asymptomatic - no recent exposure (last 10 days) testing not required  Diagnosis: Intractable nausea and vomiting [720114]  Admitting Physician: Anselm Jungling [[id number]]  Attending Physician: Anselm Jungling [[id number]]  Certification:: I certify this patient will need inpatient services for at least 2 midnights  Expected Medical Readiness: 09/26/2023          Medical History Past Medical History:  Diagnosis Date   Anxiety    Arthritis    Bipolar disorder (HCC)    Chronic pain    Depression    GERD (gastroesophageal reflux disease)    Hypertension    patient denies    Allergies Allergies  Allergen Reactions    Buprenorphine Rash    Patch gave rash- poss adhesive issues. Tried generic and brand name both. Patient states: "I am not allergic to the medication. I am allergic to the adhesive, not the medication."    Amoxicillin-Pot Clavulanate Other (See Comments)    States "strong" antibiotics cause C diff   Sulfa Antibiotics Rash   Wound Dressing Adhesive Rash    IV Location/Drains/Wounds Patient Lines/Drains/Airways Status     Active Line/Drains/Airways     Name Placement date Placement time Site Days   Peripheral IV 09/23/23 20 G 1.88" Anterior;Left Forearm 09/23/23  1512  Forearm  1            Labs/Imaging Results for orders placed or performed during the hospital encounter of 09/23/23 (from the past 48 hour(s))  Lipase, blood     Status: None   Collection Time: 09/23/23 10:11 AM  Result Value Ref Range   Lipase 50 11 - 51 U/L    Comment: Performed at [facility], [address], [place], [place] [address]  Comprehensive metabolic panel     Status: Abnormal   Collection Time: 09/23/23 10:11 AM  Result Value Ref Range   Sodium 141 135 - 145 mmol/L   Potassium 3.5 3.5 - 5.1 mmol/L   Chloride 100 98 -  111 mmol/L   CO2 24 22 - 32 mmol/L   Glucose, Bld 104 (H) 70 - 99 mg/dL    Comment: Glucose reference range applies only to samples taken after fasting for at least 8 hours.   BUN 9 6 - 20 mg/dL   Creatinine, Ser 6.04 0.44 - 1.00 mg/dL   Calcium 9.8 8.9 - 54.0 mg/dL   Total Protein 9.2 (H) 6.5 - 8.1 g/dL   Albumin 5.1 (H) 3.5 - 5.0 g/dL   AST 23 15 - 41 U/L   ALT 18 0 - 44 U/L   Alkaline Phosphatase 118 38 - 126 U/L   Total Bilirubin 1.0 0.3 - 1.2 mg/dL   GFR, Estimated >98 >11 mL/min    Comment: (NOTE) Calculated using the CKD-EPI Creatinine Equation (2021)    Anion gap 17 (H) 5 - 15    Comment: Performed at [facility], [address], [place], [place] [address]  CBC     Status: Abnormal   Collection Time: 09/23/23 10:11 AM  Result  Value Ref Range   WBC 13.5 (H) 4.0 - 10.5 K/uL   RBC 5.48 (H) 3.87 - 5.11 MIL/uL   Hemoglobin 15.6 (H) 12.0 - 15.0 g/dL   HCT 29.5 (H) 62.1 - 30.8 %   MCV 85.6 80.0 - 100.0 fL   MCH 28.5 26.0 - 34.0 pg   MCHC 33.3 30.0 - 36.0 g/dL   RDW 65.7 84.6 - 96.2 %   Platelets 361 150 - 400 K/uL   nRBC 0.0 0.0 - 0.2 %    Comment: Performed at [facility], [address], [place], [place] [address]  hCG, serum, qualitative     Status: None   Collection Time: 09/23/23 10:11 AM  Result Value Ref Range   Preg, Serum NEGATIVE NEGATIVE    Comment:        THE SENSITIVITY OF THIS METHODOLOGY IS >10 mIU/mL. Performed at [facility], [address], [place], [place] [address]   CBC     Status: Abnormal   Collection Time: 09/24/23  6:10 AM  Result Value Ref Range   WBC 14.9 (H) 4.0 - 10.5 K/uL   RBC 4.74 3.87 - 5.11 MIL/uL   Hemoglobin 13.5 12.0 - 15.0 g/dL   HCT 01.0 27.2 - 53.6 %   MCV 88.6 80.0 - 100.0 fL   MCH 28.5 26.0 - 34.0 pg   MCHC 32.1 30.0 - 36.0 g/dL   RDW 64.4 03.4 - 74.2 %   Platelets 275 150 - 400 K/uL   nRBC 0.0 0.0 - 0.2 %    Comment: Performed at [facility], [address] [place], [place] [address]  Basic metabolic panel     Status: Abnormal   Collection Time: 09/24/23  6:10 AM  Result Value Ref Range   Sodium 142 135 - 145 mmol/L   Potassium 3.2 (L) 3.5 - 5.1 mmol/L   Chloride 101 98 - 111 mmol/L   CO2 22 22 - 32 mmol/L   Glucose, Bld 86 70 - 99 mg/dL    Comment: Glucose reference range applies only to samples taken after fasting for at least 8 hours.   BUN 9 6 - 20 mg/dL   Creatinine, Ser 8.75 0.44 - 1.00 mg/dL   Calcium 9.4 8.9 - 64.3 mg/dL   GFR, Estimated >32 >95 mL/min    Comment: (NOTE) Calculated using the CKD-EPI Creatinine Equation (2021)    Anion gap 19 (H)  5 - 15    Comment: Performed at [facility], [address] [place], [place] [address]  Magnesium     Status: None    Collection Time: 09/24/23  6:10 AM  Result Value Ref Range   Magnesium 1.9 1.7 - 2.4 mg/dL    Comment: Performed at [facility], [address] [place], [place] [address]   CT ABDOMEN PELVIS W CONTRAST  Result Date: 09/23/2023 CLINICAL DATA:  Lower abdominal pain, nausea, vomiting and diarrhea. EXAM: CT ABDOMEN AND PELVIS WITH CONTRAST TECHNIQUE: Multidetector CT imaging of the abdomen and pelvis was performed using the standard protocol following bolus administration of intravenous contrast. RADIATION DOSE REDUCTION: This exam was performed according to the departmental dose-optimization program which includes automated exposure control, adjustment of the mA and/or kV according to patient size and/or use of iterative reconstruction technique. CONTRAST:  OMNIPAQUE IOHEXOL 300 MG/ML  SOLN COMPARISON:  06/14/2023. FINDINGS: Lower chest: No acute findings. Heart is at the upper limits of normal in size to mildly enlarged. No pericardial or pleural effusion. Distal esophagus is grossly unremarkable. Hepatobiliary: Probable tiny cyst in the right hepatic lobe. Liver is otherwise grossly unremarkable. Cholecystectomy. No unexpected biliary ductal dilatation. Pancreas: Negative. Spleen: Negative. Adrenals/Urinary Tract: Adrenal glands are unremarkable. 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 No follow-up necessary. 9 mm lesion in the posterior interpolar left kidney measures 45 Hounsfield units (2/26), too small to characterize, stable from 06/14/2023. Kidneys are otherwise unremarkable. Ureters are decompressed. Bladder is relatively low in volume. Stomach/Bowel: Stomach, small bowel and colon are unremarkable. Appendix is not well-visualized. Vascular/Lymphatic: Atherosclerotic calcification of the aorta. No pathologically enlarged lymph nodes. Reproductive: Uterus is visualized.  No adnexal mass. Other: No free fluid.  Mesenteries and peritoneum are  unremarkable. Musculoskeletal: Degenerative changes in the spine. Grade 3 anterolisthesis of L5 on S1 secondary to chronic bilateral pars defects. No worrisome lytic or sclerotic lesions. IMPRESSION: 1. No findings to explain the patient's clinical history. 2. 9 mm mildly hyperdense lesion in the left kidney, too small to characterize but stable from 06/14/2023. Renal cell carcinoma cannot be excluded. Further evaluation with nonemergent pre and post contrast MRI should be considered. Pre and post contrast CT could alternatively be performed, but would likely be of decreased accuracy given lesion size. 3. Grade 3 anterolisthesis of L5 on S1 secondary to bilateral L5 pars defects. 4.  Aortic atherosclerosis (ICD10-I70.0). Electronically Signed   By: Leanna Battles M.D.   On: 09/23/2023 18:02    Pending Labs Unresulted Labs (From admission, onward)     Start     Ordered   09/24/23 1201  Urinalysis, Routine w reflex microscopic -Urine, Clean Catch  Once,   R       Question:  Specimen Source  Answer:  Urine, Clean Catch   09/24/23 1200   09/24/23 1159  Gastrointestinal Panel by PCR , Stool  (Gastrointestinal Panel by PCR, Stool                                                                                                                                                     **  Does Not include CLOSTRIDIUM DIFFICILE testing. **If CDIFF testing is needed, place order from the "C Difficile Testing" order set.**)  Once,   R        09/24/23 1159   09/23/23 2144  Gastrointestinal Panel by PCR , Stool  (Gastrointestinal Panel by PCR, Stool                                                                                                                                                     **Does Not include CLOSTRIDIUM DIFFICILE testing. **If CDIFF testing is needed, place order from the "C Difficile Testing" order set.**)  Once,   URGENT       Question:  Patient immune status  Answer:  Normal   09/23/23 2143    09/23/23 1945  C Difficile Quick Screen w PCR reflex  (C Difficile quick screen w PCR reflex panel )  Once, for 24 hours,   URGENT       References:    CDiff Information Tool   09/23/23 1944   09/23/23 0950  Urinalysis, Routine w reflex microscopic -Urine, Clean Catch  Once,   URGENT       Question:  Specimen Source  Answer:  Urine, Clean Catch   09/23/23 0950            Vitals/Pain Today's Vitals   09/24/23 0400 09/24/23 0639 09/24/23 1000 09/24/23 1020  BP: (!) 154/85 (!) 150/91 (!) 155/84   Pulse: 85 91 81   Resp: 20 18 (!) 21   Temp:  98.1 F (36.7 C)  98 F (36.7 C)  TempSrc:  Oral  Oral  SpO2: 98% 100% 96%   Weight:      Height:      PainSc:        Isolation Precautions Enteric precautions (UV disinfection)  Medications Medications  ondansetron (ZOFRAN) injection 4 mg (4 mg Intravenous Given 09/24/23 0754)  promethazine (PHENERGAN) 12.5 mg in sodium chloride 0.9 % 50 mL IVPB (has no administration in time range)  enoxaparin (LOVENOX) injection 55 mg (55 mg Subcutaneous Given 09/24/23 1013)  lactated ringers infusion (0 mLs Intravenous Stopped 09/24/23 1242)  amLODipine (NORVASC) tablet 10 mg (10 mg Oral Given 09/24/23 1006)  metoprolol tartrate (LOPRESSOR) tablet 50 mg (50 mg Oral Given 09/24/23 1010)  vortioxetine HBr (TRINTELLIX) tablet 20 mg (20 mg Oral Given 09/24/23 1010)  cariprazine (VRAYLAR) capsule 6 mg (6 mg Oral Given 09/24/23 1012)  gabapentin (NEURONTIN) capsule 600 mg (600 mg Oral Given 09/24/23 1010)  gabapentin (NEURONTIN) capsule 1,200 mg (has no administration in time range)  acetaminophen (TYLENOL) tablet 650 mg (650 mg Oral Patient Refused/Not Given 09/24/23 0335)  potassium chloride 10 mEq in 100 mL IVPB (10 mEq Intravenous New Bag/Given 09/24/23 1124)  droperidol (INAPSINE) 2.5 MG/ML injection 1.25 mg (1.25 mg Intravenous Given 09/23/23 1533)  diphenhydrAMINE (BENADRYL) injection 25 mg (25 mg Intravenous Given 09/23/23 1531)  iohexol (OMNIPAQUE) 300  MG/ML solution 100 mL (100 mLs Intravenous Contrast Given 09/23/23 1708)  lactated ringers bolus 1,000 mL (0 mLs Intravenous Stopped 09/24/23 0001)  ondansetron (ZOFRAN) injection 4 mg (4 mg Intravenous Given 09/23/23 2034)  gabapentin (NEURONTIN) capsule 400 mg (400 mg Oral Given 09/23/23 2128)  ondansetron (ZOFRAN) injection 4 mg (4 mg Intravenous Given 09/24/23 0226)  gabapentin (NEURONTIN) capsule 600 mg (600 mg Oral Given 09/24/23 0347)    Mobility walks with device

## 2023-09-24 NOTE — Progress Notes (Signed)
57 year old female admitted with nausea vomiting and diarrhea after eating [employer].  Discussed with patient's daughter over the phone.  She continues to have diarrhea.  Stool studies are pending.  Continue IV fluids supportive treatment.  Daughter requested dietary consult.  She expressed a desire to speak with a dietitian. Potassium 3.2 replete Mag 1.9 WBC 14.9 CT abdomen 9 mm hypodense lesion left kidney too small to characterize but stable from 06/14/2023 nonemergent MRI recommended. She was hospitalized in July for enteropathogenic E. coli and norovirus. Follow-up labs in a.m. continue IV hydration and follow-up stool studies add UA

## 2023-09-24 NOTE — Assessment & Plan Note (Signed)
-  elevated due to missed medication from GI symptoms -resume amlodipine and metoprolol

## 2023-09-24 NOTE — Assessment & Plan Note (Signed)
Creatinine of 0.86 with baseline of 0.6-0.7 -keep on continuous IV fluid

## 2023-09-25 DIAGNOSIS — I10 Essential (primary) hypertension: Secondary | ICD-10-CM | POA: Diagnosis not present

## 2023-09-25 DIAGNOSIS — F319 Bipolar disorder, unspecified: Secondary | ICD-10-CM | POA: Diagnosis not present

## 2023-09-25 DIAGNOSIS — R197 Diarrhea, unspecified: Secondary | ICD-10-CM

## 2023-09-25 DIAGNOSIS — N179 Acute kidney failure, unspecified: Secondary | ICD-10-CM | POA: Diagnosis not present

## 2023-09-25 DIAGNOSIS — A419 Sepsis, unspecified organism: Secondary | ICD-10-CM | POA: Diagnosis not present

## 2023-09-25 LAB — C DIFFICILE QUICK SCREEN W PCR REFLEX
C Diff antigen: NEGATIVE
C Diff interpretation: NOT DETECTED
C Diff toxin: NEGATIVE

## 2023-09-25 LAB — CBC
HCT: 40.5 % (ref 36.0–46.0)
Hemoglobin: 13.2 g/dL (ref 12.0–15.0)
MCH: 29.1 pg (ref 26.0–34.0)
MCHC: 32.6 g/dL (ref 30.0–36.0)
MCV: 89.2 fL (ref 80.0–100.0)
Platelets: 268 10*3/uL (ref 150–400)
RBC: 4.54 MIL/uL (ref 3.87–5.11)
RDW: 12.6 % (ref 11.5–15.5)
WBC: 9.1 10*3/uL (ref 4.0–10.5)
nRBC: 0 % (ref 0.0–0.2)

## 2023-09-25 LAB — COMPREHENSIVE METABOLIC PANEL
ALT: 17 U/L (ref 0–44)
AST: 19 U/L (ref 15–41)
Albumin: 4 g/dL (ref 3.5–5.0)
Alkaline Phosphatase: 90 U/L (ref 38–126)
Anion gap: 16 — ABNORMAL HIGH (ref 5–15)
BUN: 10 mg/dL (ref 6–20)
CO2: 21 mmol/L — ABNORMAL LOW (ref 22–32)
Calcium: 8.9 mg/dL (ref 8.9–10.3)
Chloride: 102 mmol/L (ref 98–111)
Creatinine, Ser: 0.88 mg/dL (ref 0.44–1.00)
GFR, Estimated: >60 mL/min (ref >60)
Glucose, Bld: 66 mg/dL — ABNORMAL LOW (ref 70–99)
Potassium: 3.2 mmol/L — ABNORMAL LOW (ref 3.5–5.1)
Sodium: 139 mmol/L (ref 135–145)
Total Bilirubin: 1.2 mg/dL (ref 0.3–1.2)
Total Protein: 7.1 g/dL (ref 6.5–8.1)

## 2023-09-25 LAB — GLUCOSE, CAPILLARY
Glucose-Capillary: 66 mg/dL — ABNORMAL LOW (ref 70–99)
Glucose-Capillary: 86 mg/dL (ref 70–99)
Glucose-Capillary: 69 mg/dL — ABNORMAL LOW (ref 70–99)
Glucose-Capillary: 75 mg/dL (ref 70–99)
Glucose-Capillary: 107 mg/dL — ABNORMAL HIGH (ref 70–99)
Glucose-Capillary: 129 mg/dL — ABNORMAL HIGH (ref 70–99)

## 2023-09-25 LAB — MAGNESIUM: Magnesium: 2 mg/dL (ref 1.7–2.4)

## 2023-09-25 LAB — LIPASE, BLOOD: Lipase: 61 U/L — ABNORMAL HIGH (ref 11–51)

## 2023-09-25 MED ORDER — POTASSIUM CHLORIDE CRYS ER 20 MEQ PO TBCR
40.0000 meq | EXTENDED_RELEASE_TABLET | Freq: Once | ORAL | Status: AC
  Administered 2023-09-25: 40 meq via ORAL
  Filled 2023-09-25: qty 2

## 2023-09-25 MED ORDER — BOOST / RESOURCE BREEZE PO LIQD CUSTOM
1.0000 | Freq: Three times a day (TID) | ORAL | Status: DC
  Administered 2023-09-25 – 2023-09-26 (×2): 1 via ORAL

## 2023-09-25 MED ORDER — DEXTROSE 50 % IV SOLN: 25.0000 mL | Freq: Once | INTRAVENOUS | Status: DC

## 2023-09-25 MED ORDER — LACTATED RINGERS IV SOLN: INTRAVENOUS | Status: AC

## 2023-09-25 NOTE — Progress Notes (Addendum)
Lab draw resulted with BG at 66. Rechecked with POC glucometer and it was 66.   Notified MD since patient isn't diabetic and has no orders for checking BG nor treatments.   Gave Potassium pills this AM so I gave OJ with them. We will recheck the BG in 15 minutes.

## 2023-09-25 NOTE — Progress Notes (Signed)
Initial Nutrition Assessment  DOCUMENTATION CODES:   Morbid obesity  INTERVENTION:   -Boost Breeze po TID, each supplement provides 250 kcal and 9 grams of protein   -Will monitor for diet advancement  NUTRITION DIAGNOSIS:   Increased nutrient needs related to chronic illness as evidenced by estimated needs.  GOAL:   Patient will meet greater than or equal to 90% of their needs  MONITOR:   PO intake, Diet advancement, Supplement acceptance, Labs, Weight trends, I & O's  REASON FOR ASSESSMENT:   Consult Assessment of nutrition requirement/status  ASSESSMENT:   57 y.o. female with medical history significant of HTN, migraine, OSA, primary narcolepsy with catalepsy, chronic pain syndrome, chronic diarrhea since gallbladder surgery, Bipolar disorder, anxiety and depression who presents with intractable nausea and vomiting.  Patient in room, receiving patient care at time of visit, no family present. Pt currently on clear liquids. Per MD consult, daughter would like to speak with RD. Will contact daughter at later time once diet is advanced further and able to speak with patient. Will order Boost Breeze at this time. Pt has been having hypoglycemic events. Per chart review, pt began to have N/V/diarrhea following a meal at [facility]. GI panel is pending.   Per weight records, no recent weight changes noted.  Medications: IV Zofran, KLOR-CON, Lactated ringers   Labs reviewed: CBGs: 66-86 Low K  NUTRITION - FOCUSED PHYSICAL EXAM:  Unable to complete at this time.  Diet Order:   Diet Order             Diet clear liquid Fluid consistency: Thin  Diet effective now                   EDUCATION NEEDS:   Not appropriate for education at this time  Skin:  Skin Assessment: Reviewed RN Assessment  Last BM:  10/9 -type 7  Height:   Ht Readings from Last 1 Encounters:  09/23/23 5\' 4"  (1.626 m)    Weight:   Wt Readings from Last 1 Encounters:  09/23/23 115 kg     BMI:  Body mass index is 43.52 kg/m.  Estimated Nutritional Needs:   Kcal:  1700-1900  Protein:  85-100g  Fluid:  1.9L/day  Tilda Franco, MS, RD, LDN Inpatient Clinical Dietitian Contact information available via Amion

## 2023-09-25 NOTE — Progress Notes (Signed)
Notified MD Janee Morn in regards to hypoglycemia but with normalizing with diet added. Patient's CBG 69 but increased to 75 after drinking orange juice. Patient has also began eating clear liquid diet tray as well. Will continue to monitor patient's CBG q4hr.  Also, patient having numerous loose stools, type 7. Patient's skin is raw and irritated from stool from perineum.

## 2023-09-25 NOTE — Plan of Care (Signed)
?  Problem: Education: ?Goal: Knowledge of General Education information will improve ?Description: Including pain rating scale, medication(s)/side effects and non-pharmacologic comfort measures ?Outcome: Progressing ?  ?Problem: Health Behavior/Discharge Planning: ?Goal: Ability to manage health-related needs will improve ?Outcome: Progressing ?  ?Problem: Clinical Measurements: ?Goal: Will remain free from infection ?Outcome: Progressing ?  ?

## 2023-09-25 NOTE — Progress Notes (Signed)
PROGRESS NOTE    Holly Roberts  WUJ:[id number] DOB: 1966-11-29 DOA: 09/23/2023 PCP: Ivonne Andrew, NP    Chief Complaint  Patient presents with   Nausea   Vomiting   Diarrhea    Brief Narrative:  Patient 57 year old female history of hypertension, migraines, OSA, narcolepsy and catalepsy, chronic pain syndrome, chronic diarrhea since gallbladder surgery, bipolar disorder, anxiety depression presenting with intractable nausea and vomiting after having a meal at [employer].  Patient felt chills but no objective fevers.  Patient with complaints of diffuse abdominal pain.  Patient noted to have chronic diarrhea postcholecystectomy however diarrhea has worsened.  Patient with difficulty taking medications over the past 2 days due to nausea and emesis.  Patient seen in the ED noted to be tachycardic heart rate of 131, blood pressure 161/115, CBC with a leukocytosis.  CT abdomen and pelvis with no acute abdominal abnormalities to explain patient's symptoms.  Patient admitted, being hydrated with IV fluids, supportive care, stool studies ordered and pending.     Assessment & Plan:   Principal Problem:   Sepsis (HCC) Active Problems:   AKI (acute kidney injury) (HCC)   Bipolar disorder (HCC)   HTN (hypertension)   Intractable nausea and vomiting   Renal lesion   Obesity, Class III, BMI 40-49.9 (morbid obesity) (HCC)   Gastroenteritis   #1 sepsis likely secondary to gastroenteritis -Patient presented with nausea vomiting worsening diarrhea after eating at [facility] prior to admission with acute on chronic diarrhea. -Patient met criteria for sepsis secondary to tachycardia, leukocytosis, some tachypnea. -C. difficile PCR negative. -GI pathogen panel ordered and pending. -Continue supportive care with IV fluids, IV antiemetics. -Trial of clear liquid diet. -Supportive care.  2.  Hypokalemia -Magnesium at 2.0. -Likely secondary to GI losses. -Replete.  3.  Elevated anion  gap -Patient with elevated anion gap. -IV fluids.  4.  Bipolar disorder -Continue home regimen Trintellix and Vraylar.  5.  Renal lesion -9 mm mildly hyperdense lesion in the left kidney, too small to characterize but stable from 06/14/2023.  Renal cell carcinoma cannot be excluded.  Further evaluation with nonemergent pre and postcontrast MRI should be considered. -Outpatient follow-up.  6.  Hypertension -Continue Norvasc 10 mg daily, Lopressor 50 mg twice daily.  7.  Morbid obesity -BMI 43. -Lifestyle modification. -Outpatient follow-up with PCP. -   DVT prophylaxis: Lovenox Code Status: Full Family Communication: Updated patient.  No family at bedside. Disposition: TBD  Status is: Inpatient Remains inpatient appropriate because: Severity of illness   Consultants:  None  Procedures:  CT abdomen and pelvis 09/23/2023   Antimicrobials:  Anti-infectives (From admission, onward)    Start     Dose/Rate Route Frequency Ordered Stop   09/24/23 1900  fluconazole (DIFLUCAN) tablet 150 mg        150 mg Oral Daily 09/24/23 1801 09/27/23 0959         Subjective: Patient laying in bed sleepy but easily arousable.  Denies any chest pain or shortness of breath.  Some complaints of lower abdominal pain/discomfort.  Denies any significant dysuria.  Noted to be having watery stools per RN.  Objective: Vitals:   09/24/23 1724 09/24/23 2103 09/25/23 0137 09/25/23 0608  BP: (!) 148/88 139/72 122/75 136/71  Pulse: 73 74 64 72  Resp: (!) 21 20 20 20   Temp: 98.5 F (36.9 C) 98.9 F (37.2 C) 97.6 F (36.4 C) 97.7 F (36.5 C)  TempSrc: Oral Oral Oral Oral  SpO2: 100% 97% 92% 96%  Weight:  Height:        Intake/Output Summary (Last 24 hours) at 09/25/2023 1934 Last data filed at 09/25/2023 1900 Gross per 24 hour  Intake 954.8 ml  Output 300 ml  Net 654.8 ml   Filed Weights   09/23/23 0946  Weight: 115 kg    Examination:  General exam: Appears calm and  comfortable  Respiratory system: Clear to auscultation anterior lung fields.Marland Kitchen Respiratory effort normal. Cardiovascular system: S1 & S2 heard, RRR. No JVD, murmurs, rubs, gallops or clicks. No pedal edema. Gastrointestinal system: Abdomen is nondistended, soft and some tenderness to palpation in the lower abdominal region.  Positive bowel sounds.  No rebound.  No guarding.  Central nervous system: Alert and oriented. No focal neurological deficits. Extremities: Symmetric 5 x 5 power. Skin: No rashes, lesions or ulcers Psychiatry: Judgement and insight appear fair. Mood & affect appropriate.     Data Reviewed: I have personally reviewed following labs and imaging studies  CBC: Recent Labs  Lab 09/23/23 1011 09/24/23 0610 09/25/23 0504  WBC 13.5* 14.9* 9.1  HGB 15.6* 13.5 13.2  HCT 46.9* 42.0 40.5  MCV 85.6 88.6 89.2  PLT 361 275 268    Basic Metabolic Panel: Recent Labs  Lab 09/23/23 1011 09/24/23 0610 09/25/23 0504  NA 141 142 139  K 3.5 3.2* 3.2*  CL 100 101 102  CO2 24 22 21*  GLUCOSE 104* 86 66*  BUN 9 9 10   CREATININE 0.86 0.87 0.88  CALCIUM 9.8 9.4 8.9  MG  --  1.9 2.0    GFR: Estimated Creatinine Clearance: 87.7 mL/min (by C-G formula based on SCr of 0.88 mg/dL).  Liver Function Tests: Recent Labs  Lab 09/23/23 1011 09/25/23 0504  AST 23 19  ALT 18 17  ALKPHOS 118 90  BILITOT 1.0 1.2  PROT 9.2* 7.1  ALBUMIN 5.1* 4.0    CBG: Recent Labs  Lab 09/25/23 0645 09/25/23 0743 09/25/23 1136 09/25/23 1155 09/25/23 1530  GLUCAP 66* 86 69* 75 107*     Recent Results (from the past 240 hour(s))  C Difficile Quick Screen w PCR reflex     Status: None   Collection Time: 09/25/23  9:19 AM   Specimen: STOOL  Result Value Ref Range Status   C Diff antigen NEGATIVE NEGATIVE Final   C Diff toxin NEGATIVE NEGATIVE Final   C Diff interpretation No C. difficile detected.  Final    Comment: Performed at [facility], [address] [place], [place] [address]         Radiology Studies: No results found.      Scheduled Meds:  acetaminophen  650 mg Oral Once   amLODipine  10 mg Oral Daily   cariprazine  6 mg Oral Daily   dextrose  25 mL Intravenous Once   enoxaparin (LOVENOX) injection  55 mg Subcutaneous Q24H   feeding supplement  1 Container Oral TID BM   fluconazole  150 mg Oral Daily   gabapentin  1,200 mg Oral QHS   gabapentin  600 mg Oral Daily   metoprolol tartrate  50 mg Oral BID   nystatin   Topical TID   vortioxetine HBr  20 mg Oral Daily   Continuous Infusions:  lactated ringers 125 mL/hr at 09/25/23 1900   promethazine (PHENERGAN) injection (IM or IVPB) 12.5 mg (09/24/23 2215)     LOS: 2 days    Time spent: 40 minutes    Ramiro Harvest, MD [facility]  To contact the attending provider between 7A-7P or the covering provider during after hours 7P-7A, please log into the web site [email or web address] and access using universal [place] password for that web site. If you do not have the password, please call the hospital operator.  09/25/2023, 7:34 PM

## 2023-09-25 NOTE — Progress Notes (Signed)
Chaplain engaged in an initial visit with Holly Roberts and her daughter, Holly Roberts. She shared that her mom had a hard time in the emergency department last night in that [place] laid in her feces for an extended amount of time. Daughter noted that the [facility] was informed of this matter. Chaplain offered compassion and understanding around that significant event.   Chaplain also discussed Healthcare POA documents. Holly Roberts does desire to complete them as she has more than one child and would only like to appoint [place]. Chaplain noted that we could wait to complete the document when the precautions come off the room due to the use and transfer of paperwork and utilizing witnesses that cannot work at the hospital. They agreed. Chaplain also offered assurance that Holly Roberts would be contacted in the event Holly Roberts cannot make medical decisions for herself. She is lawfully Holly Roberts's "next-of-kin" and has decision making power.   Chaplain offered prayer with them as Holly Roberts became tearful. Chaplain offered compassionate presence, support, and education.     09/25/23 0900  Spiritual Encounters  Type of Visit Initial  Care provided to: Pt and family  Reason for visit Advance directives  Spiritual Framework  Presenting Themes Impactful experiences and emotions;Community and relationships;Rituals and practive;Goals in life/care  Community/Connection Family  Patient Stress Factors Health changes;Exhausted  Interventions  Spiritual Care Interventions Made Established relationship of care and support;Compassionate presence;Reflective listening;Prayer;Decision-making support/facilitation;Normalization of emotions;Encouragement  Intervention Outcomes  Outcomes Awareness of support;Connection to spiritual care

## 2023-09-26 ENCOUNTER — Ambulatory Visit: Payer: BC Managed Care – PPO | Admitting: Nurse Practitioner

## 2023-09-26 DIAGNOSIS — I10 Essential (primary) hypertension: Secondary | ICD-10-CM | POA: Diagnosis not present

## 2023-09-26 DIAGNOSIS — N179 Acute kidney failure, unspecified: Secondary | ICD-10-CM | POA: Diagnosis not present

## 2023-09-26 DIAGNOSIS — A419 Sepsis, unspecified organism: Secondary | ICD-10-CM | POA: Diagnosis not present

## 2023-09-26 DIAGNOSIS — A0811 Acute gastroenteropathy due to Norwalk agent: Secondary | ICD-10-CM

## 2023-09-26 DIAGNOSIS — F319 Bipolar disorder, unspecified: Secondary | ICD-10-CM | POA: Diagnosis not present

## 2023-09-26 LAB — GLUCOSE, CAPILLARY
Glucose-Capillary: 103 mg/dL — ABNORMAL HIGH (ref 70–99)
Glucose-Capillary: 105 mg/dL — ABNORMAL HIGH (ref 70–99)
Glucose-Capillary: 110 mg/dL — ABNORMAL HIGH (ref 70–99)
Glucose-Capillary: 120 mg/dL — ABNORMAL HIGH (ref 70–99)
Glucose-Capillary: 93 mg/dL (ref 70–99)
Glucose-Capillary: 94 mg/dL (ref 70–99)

## 2023-09-26 LAB — CBC WITH DIFFERENTIAL/PLATELET
Abs Immature Granulocytes: 0.05 10*3/uL (ref 0.00–0.07)
Basophils Absolute: 0.1 10*3/uL (ref 0.0–0.1)
Basophils Relative: 1 %
Eosinophils Absolute: 0.1 10*3/uL (ref 0.0–0.5)
Eosinophils Relative: 2 %
HCT: 46.7 % — ABNORMAL HIGH (ref 36.0–46.0)
Hemoglobin: 14.5 g/dL (ref 12.0–15.0)
Immature Granulocytes: 1 %
Lymphocytes Relative: 19 %
Lymphs Abs: 1.4 10*3/uL (ref 0.7–4.0)
MCH: 28.9 pg (ref 26.0–34.0)
MCHC: 31 g/dL (ref 30.0–36.0)
MCV: 93.2 fL (ref 80.0–100.0)
Monocytes Absolute: 0.7 10*3/uL (ref 0.1–1.0)
Monocytes Relative: 9 %
Neutro Abs: 5.2 10*3/uL (ref 1.7–7.7)
Neutrophils Relative %: 68 %
Platelets: 207 10*3/uL (ref 150–400)
RBC: 5.01 MIL/uL (ref 3.87–5.11)
RDW: 12.5 % (ref 11.5–15.5)
WBC: 7.6 10*3/uL (ref 4.0–10.5)
nRBC: 0 % (ref 0.0–0.2)

## 2023-09-26 LAB — GASTROINTESTINAL PANEL BY PCR, STOOL (REPLACES STOOL CULTURE)

## 2023-09-26 LAB — MAGNESIUM: Magnesium: 2 mg/dL (ref 1.7–2.4)

## 2023-09-26 LAB — RENAL FUNCTION PANEL
Albumin: 3.9 g/dL (ref 3.5–5.0)
Anion gap: 13 (ref 5–15)
BUN: 7 mg/dL (ref 6–20)
CO2: 23 mmol/L (ref 22–32)
Calcium: 8.8 mg/dL — ABNORMAL LOW (ref 8.9–10.3)
Chloride: 103 mmol/L (ref 98–111)
Creatinine, Ser: 0.72 mg/dL (ref 0.44–1.00)
GFR, Estimated: >60 mL/min (ref >60)
Glucose, Bld: 97 mg/dL (ref 70–99)
Phosphorus: 3.4 mg/dL (ref 2.5–4.6)
Potassium: 3.9 mmol/L (ref 3.5–5.1)
Sodium: 139 mmol/L (ref 135–145)

## 2023-09-26 MED ORDER — KATE FARMS STANDARD 1.4 PO LIQD
325.0000 mL | Freq: Two times a day (BID) | ORAL | Status: DC
  Administered 2023-09-26 – 2023-09-28 (×5): 325 mL via ORAL
  Filled 2023-09-26 (×5): qty 325

## 2023-09-26 MED ORDER — LACTATED RINGERS IV SOLN: INTRAVENOUS | Status: AC

## 2023-09-26 MED ORDER — BANATROL TF EN LIQD
60.0000 mL | Freq: Two times a day (BID) | ENTERAL | Status: DC
  Administered 2023-09-26 – 2023-09-28 (×5): 60 mL via ORAL
  Filled 2023-09-26 (×6): qty 60

## 2023-09-26 NOTE — Progress Notes (Signed)
Nutrition Follow-up  DOCUMENTATION CODES:   Morbid obesity  INTERVENTION:   -Will order [employer] 1.4 po BID, each supplement provides 455 kcal and 20 grams protein.   -D/c Boost Breeze  -Will trial Banatrol soluble fiber supplement BID, each provides 45 kcals, 2g protein and 5g soluble fiber to help with diarrhea.  -Spoke briefly with pt's daughter on the phone who stated vaguely that pt will need diet education before discharge. Did not provide exactly what pt needed to be educated about and didn't provide any diet history.  NUTRITION DIAGNOSIS:   Increased nutrient needs related to chronic illness as evidenced by estimated needs.  Ongoing.  GOAL:   Patient will meet greater than or equal to 90% of their needs  Not meeting.  MONITOR:   PO intake, Diet advancement, Supplement acceptance, Labs, Weight trends, I & O's  REASON FOR ASSESSMENT:   Consult Assessment of nutrition requirement/status  ASSESSMENT:   57 y.o. female with medical history significant of HTN, migraine, OSA, primary narcolepsy with catalepsy, chronic pain syndrome, chronic diarrhea since gallbladder surgery, Bipolar disorder, anxiety and depression who presents with intractable nausea and vomiting.  Spoke with patient at bedside, a bit lethargic but was able to answer my questions. Pt states I need to talk to her daughter and RD planned to call after visiting with patient. Pt on full liquids today. Has not eaten yet. Patient reports having watery stool and she has had to have a rectal tube placed. Reports that at home she takes cholestyramine for diarrhea and this helps. Messaged MD about this to see if it is an option.  Tolerated clears yesterday. Pt agreeable to plant based protein shake today as she doesn't tolerate dairy well. Pt didn't like Boost Breeze so will d/c. Will trial soluble fiber supplement as well to aid diarrhea.  Patient reports no weight changes recently that she is aware of.    Called daughter on phone as requested by MD on 10/8, daughter very short with RD and reports  she has no questions "at this point". Goes on to state that the patient is probably telling lies about her and that she is provided meals. "She's going to eat whatever she wants. You need to tell her what she needs to eat at home. I will not be back to the hospital in person until she is discharged". Then hung up.   Medications: Zofran  Labs reviewed: CBGs: 69-129   NUTRITION - FOCUSED PHYSICAL EXAM:   Diet Order:   Diet Order             Diet full liquid Room service appropriate? Yes; Fluid consistency: Thin  Diet effective now                   EDUCATION NEEDS:   Not appropriate for education at this time  Skin:  Skin Assessment: Skin Integrity Issues: Skin Integrity Issues:: Other (Comment) Other: MASD perineum  Last BM:  10/10 -rectal tube  Height:   Ht Readings from Last 1 Encounters:  09/23/23 5\' 4"  (1.626 m)    Weight:   Wt Readings from Last 1 Encounters:  09/23/23 115 kg    BMI:  Body mass index is 43.52 kg/m.  Estimated Nutritional Needs:   Kcal:  1700-1900  Protein:  85-100g  Fluid:  1.9L/day  Tilda Franco, MS, RD, LDN Inpatient Clinical Dietitian Contact information available via Amion

## 2023-09-26 NOTE — Progress Notes (Signed)
PROGRESS NOTE    Holly Roberts  ZOX:[id number] DOB: 07/05/1966 DOA: 09/23/2023 PCP: Ivonne Andrew, NP    Chief Complaint  Patient presents with   Nausea   Vomiting   Diarrhea    Brief Narrative:  Patient 57 year old female history of hypertension, migraines, OSA, narcolepsy and catalepsy, chronic pain syndrome, chronic diarrhea since gallbladder surgery, bipolar disorder, anxiety depression presenting with intractable nausea and vomiting after having a meal at [employer].  Patient felt chills but no objective fevers.  Patient with complaints of diffuse abdominal pain.  Patient noted to have chronic diarrhea postcholecystectomy however diarrhea has worsened.  Patient with difficulty taking medications over the past 2 days due to nausea and emesis.  Patient seen in the ED noted to be tachycardic heart rate of 131, blood pressure 161/115, CBC with a leukocytosis.  CT abdomen and pelvis with no acute abdominal abnormalities to explain patient's symptoms.  Patient admitted, being hydrated with IV fluids, supportive care, stool studies ordered and pending.     Assessment & Plan:   Principal Problem:   Sepsis (HCC) Active Problems:   Norovirus   AKI (acute kidney injury) (HCC)   Bipolar disorder (HCC)   HTN (hypertension)   Intractable nausea and vomiting   Renal lesion   Obesity, Class III, BMI 40-49.9 (morbid obesity) (HCC)   Gastroenteritis   #1 sepsis likely secondary to gastroenteritis secondary to norovirus./Norovirus infection. -Patient presented with nausea vomiting worsening diarrhea after eating at [facility] prior to admission with acute on chronic diarrhea. -Patient met criteria for sepsis secondary to tachycardia, leukocytosis, some tachypnea. -Rectal tube with 40 cc recorded over the past 24 hours. -C. difficile PCR negative. -GI pathogen panel positive for norovirus.  -Continue supportive care with IV fluids, IV antiemetics. -Diet was advanced to full liquid diet and  will advance to a soft diet for supper.  -Will hold off on resumption of patient's questran for now. -Supportive care. -Daughter feels patient has multiple hospitalizations due to similar issues and was requesting referral to GI in the outpatient setting for further evaluation.  2.  Hypokalemia -Magnesium at 2.0. -Likely secondary to GI losses. -Repleted.   -Repeat labs in the AM.   3.  Elevated anion gap -Patient with elevated anion gap. -IV fluids.  4.  Bipolar disorder -Trintellix, Vraylar.   -Outpatient follow-up.   5.  Renal lesion -9 mm mildly hyperdense lesion in the left kidney, too small to characterize but stable from 06/14/2023.  Renal cell carcinoma cannot be excluded.  Further evaluation with nonemergent pre and postcontrast MRI should be considered. -Outpatient follow-up.  6.  Hypertension -Continue Lopressor 50 mg twice daily, Norvasc 10 mg daily.   7.  Morbid obesity -BMI 43. -Lifestyle modification. -Outpatient follow-up with PCP. -   DVT prophylaxis: Lovenox Code Status: Full Family Communication: Updated patient.  Updated daughter at bedside.  Disposition: TBD  Status is: Inpatient Remains inpatient appropriate because: Severity of illness   Consultants:  None  Procedures:  CT abdomen and pelvis 09/23/2023   Antimicrobials:  Anti-infectives (From admission, onward)    Start     Dose/Rate Route Frequency Ordered Stop   09/24/23 1900  fluconazole (DIFLUCAN) tablet 150 mg        150 mg Oral Daily 09/24/23 1801 09/26/23 1004         Subjective: Patient laying in bed.  Denies any chest pain or shortness of breath.  Still with some lower abdominal discomfort.  Rectal tube in place patient having loose  stools.  Tolerated clear liquids.  No further nausea or vomiting.  Daughter at bedside and frustrated that patient keeps getting hospitalized with similar symptoms.   Objective: Vitals:   09/26/23 0431 09/26/23 1002 09/26/23 1003 09/26/23 1234   BP: 131/77 (!) 155/90  (!) 145/102  Pulse: 67  83 66  Resp: 16   16  Temp: 98.8 F (37.1 C)   98.2 F (36.8 C)  TempSrc: Oral   Oral  SpO2: 96%   96%  Weight:      Height:        Intake/Output Summary (Last 24 hours) at 09/26/2023 1747 Last data filed at 09/26/2023 1453 Gross per 24 hour  Intake 1709.34 ml  Output 1500 ml  Net 209.34 ml   Filed Weights   09/23/23 0946  Weight: 115 kg    Examination:  General exam: NAD. Respiratory system: CTAB.  No wheezes, no crackles, no rhonchi.  Fair air movement.  Speaking in full sentences.  Cardiovascular system: Regular rate rhythm no murmurs rubs or gallops.  No JVD.  No lower extremity edema.  Gastrointestinal system: Abdomen is soft, nondistended, some tenderness to palpation lower abdominal region.  Positive bowel sounds.  No rebound.  No guarding.  Central nervous system: Alert and oriented. No focal neurological deficits. Extremities: Symmetric 5 x 5 power. Skin: No rashes, lesions or ulcers Psychiatry: Judgement and insight appear fair. Mood & affect appropriate.     Data Reviewed: I have personally reviewed following labs and imaging studies  CBC: Recent Labs  Lab 09/23/23 1011 09/24/23 0610 09/25/23 0504 09/26/23 0507  WBC 13.5* 14.9* 9.1 7.6  NEUTROABS  --   --   --  5.2  HGB 15.6* 13.5 13.2 14.5  HCT 46.9* 42.0 40.5 46.7*  MCV 85.6 88.6 89.2 93.2  PLT 361 275 268 207    Basic Metabolic Panel: Recent Labs  Lab 09/23/23 1011 09/24/23 0610 09/25/23 0504 09/26/23 0507  NA 141 142 139 139  K 3.5 3.2* 3.2* 3.9  CL 100 101 102 103  CO2 24 22 21* 23  GLUCOSE 104* 86 66* 97  BUN 9 9 10 7   CREATININE 0.86 0.87 0.88 0.72  CALCIUM 9.8 9.4 8.9 8.8*  MG  --  1.9 2.0 2.0  PHOS  --   --   --  3.4    GFR: Estimated Creatinine Clearance: 96.5 mL/min (by C-G formula based on SCr of 0.72 mg/dL).  Liver Function Tests: Recent Labs  Lab 09/23/23 1011 09/25/23 0504 09/26/23 0507  AST 23 19  --   ALT 18  17  --   ALKPHOS 118 90  --   BILITOT 1.0 1.2  --   PROT 9.2* 7.1  --   ALBUMIN 5.1* 4.0 3.9    CBG: Recent Labs  Lab 09/26/23 0025 09/26/23 0428 09/26/23 0732 09/26/23 1131 09/26/23 1624  GLUCAP 93 105* 103* 94 120*     Recent Results (from the past 240 hour(s))  Gastrointestinal Panel by PCR , Stool     Status: Abnormal   Collection Time: 09/25/23  9:18 AM   Specimen: STOOL  Result Value Ref Range Status   Campylobacter species NOT DETECTED NOT DETECTED Final   Plesimonas shigelloides NOT DETECTED NOT DETECTED Final   Salmonella species NOT DETECTED NOT DETECTED Final   Yersinia enterocolitica NOT DETECTED NOT DETECTED Final   Vibrio species NOT DETECTED NOT DETECTED Final   Vibrio cholerae NOT DETECTED NOT DETECTED Final   Enteroaggregative E coli (  EAEC) NOT DETECTED NOT DETECTED Final   Enteropathogenic E coli (EPEC) NOT DETECTED NOT DETECTED Final   Enterotoxigenic E coli (ETEC) NOT DETECTED NOT DETECTED Final   Shiga like toxin producing E coli (STEC) NOT DETECTED NOT DETECTED Final   Shigella/Enteroinvasive E coli (EIEC) NOT DETECTED NOT DETECTED Final   Cryptosporidium NOT DETECTED NOT DETECTED Final   Cyclospora cayetanensis NOT DETECTED NOT DETECTED Final   Entamoeba histolytica NOT DETECTED NOT DETECTED Final   Giardia lamblia NOT DETECTED NOT DETECTED Final   Adenovirus F40/41 NOT DETECTED NOT DETECTED Final   Astrovirus NOT DETECTED NOT DETECTED Final   Norovirus GI/GII DETECTED (A) NOT DETECTED Final    Comment: CRITICAL RESULT CALLED TO, READ BACK BY AND VERIFIED WITH: EVIE RAEGBUNAM 09/26/23 @ 0943 BY SH    Rotavirus A NOT DETECTED NOT DETECTED Final   Sapovirus (I, II, IV, and V) NOT DETECTED NOT DETECTED Final    Comment: Performed at [facility], [address] [place], [place] [address]  C Difficile Quick Screen w PCR reflex     Status: None   Collection Time: 09/25/23  9:19 AM   Specimen: STOOL  Result Value Ref Range Status   C  Diff antigen NEGATIVE NEGATIVE Final   C Diff toxin NEGATIVE NEGATIVE Final   C Diff interpretation No C. difficile detected.  Final    Comment: Performed at [facility], [address] [place], [place] [address]         Radiology Studies: No results found.      Scheduled Meds:  acetaminophen  650 mg Oral Once   amLODipine  10 mg Oral Daily   cariprazine  6 mg Oral Daily   dextrose  25 mL Intravenous Once   enoxaparin (LOVENOX) injection  55 mg Subcutaneous Q24H   feeding supplement (KATE FARMS STANDARD 1.4)  325 mL Oral BID BM   fiber supplement (BANATROL TF)  60 mL Oral BID   gabapentin  1,200 mg Oral QHS   gabapentin  600 mg Oral Daily   metoprolol tartrate  50 mg Oral BID   nystatin   Topical TID   vortioxetine HBr  20 mg Oral Daily   Continuous Infusions:  promethazine (PHENERGAN) injection (IM or IVPB) 12.5 mg (09/24/23 2215)     LOS: 3 days    Time spent: 40 minutes    Ramiro Harvest, MD [facility]   To contact the attending provider between 7A-7P or the covering provider during after hours 7P-7A, please log into the web site [email or web address] and access using universal [place] password for that web site. If you do not have the password, please call the hospital operator.  09/26/2023, 5:47 PM

## 2023-09-26 NOTE — TOC Initial Note (Signed)
Transition of Care [facility]) - Initial/Assessment Note    Patient Details  Name: Holly Roberts MRN: [id number] Date of Birth: 18-Dec-1965  [facility] CM/SW Contact:    Lanier Clam, RN Phone Number: 09/26/2023, 2:10 PM  Clinical Narrative:d/c home.                   Expected Discharge Plan: Home/Self Care Barriers to Discharge: Continued Medical Work up   Patient Goals and CMS Choice Patient states their goals for this hospitalization and ongoing recovery are:: Home CMS Medicare.gov Compare Post Acute Care list provided to:: Patient Choice offered to / list presented to : Patient [place] ownership interest in [facility].provided to:: Patient    Expected Discharge Plan and Services   Discharge Planning Services: CM Consult Post Acute Care Choice: Resumption of Svcs/PTA Provider Living arrangements for the past 2 months: Single Family Home                                      Prior Living Arrangements/Services Living arrangements for the past 2 months: Single Family Home Lives with:: Adult Children Patient language and need for interpreter reviewed:: Yes Do you feel safe going back to the place where you live?: Yes      Need for Family Participation in Patient Care: Yes (Comment) Care giver support system in place?: Yes (comment)   Criminal Activity/Legal Involvement Pertinent to Current Situation/Hospitalization: No - Comment as needed  Activities of Daily Living   ADL Screening (condition at time of admission) Independently performs ADLs?: No Does the patient have a NEW difficulty with bathing/dressing/toileting/self-feeding that is expected to last >3 days?: No Does the patient have a NEW difficulty with getting in/out of bed, walking, or climbing stairs that is expected to last >3 days?: No Does the patient have a NEW difficulty with communication that is expected to last >3 days?: No Is the patient deaf or have difficulty hearing?:  No Does the patient have difficulty seeing, even when wearing glasses/contacts?: No Does the patient have difficulty concentrating, remembering, or making decisions?: Yes  Permission Sought/Granted Permission sought to share information with : Case Manager Permission granted to share information with : Yes, Verbal Permission Granted  Share Information with NAME: Case manager           Emotional Assessment Appearance:: Appears stated age Attitude/Demeanor/Rapport: Gracious Affect (typically observed): Accepting Orientation: : Oriented to Self, Oriented to Place, Oriented to  Time, Oriented to Situation Alcohol / Substance Use: Not Applicable Psych Involvement: No (comment)  Admission diagnosis:  Diarrhea of presumed infectious origin [R19.7] Gastroenteritis [K52.9] Intractable nausea and vomiting [R11.2] Patient Active Problem List   Diagnosis Date Noted   Renal lesion 09/24/2023   Obesity, Class III, BMI 40-49.9 (morbid obesity) (HCC) 09/24/2023   HLD (hyperlipidemia) 09/24/2023   Sepsis (HCC) 09/24/2023   Gastroenteritis 09/24/2023   N&V (nausea and vomiting) 06/15/2023   Lipid screening 03/13/2023   Routine health maintenance 10/01/2022   Norovirus 12/24/2021   Diarrhea 12/22/2021   Tachycardia 12/22/2021   Hypokalemia 12/22/2021   Elevated troponin 12/22/2021   Intractable nausea and vomiting 12/21/2021   Neurocognitive deficits 08/14/2021   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   HTN (hypertension) 08/08/2021   Pressure injury of skin 07/31/2021   Acute encephalopathy 07/30/2021   AKI (acute kidney injury) (HCC) 06/09/2021   Hypoglycemia without diagnosis of diabetes mellitus 06/09/2021  Elevated lipase 06/09/2021   Bipolar disorder (HCC) 06/09/2021   Metabolic acidosis 06/09/2021   Vomiting and diarrhea 06/09/2021   Cholecystitis with cholelithiasis 04/19/2021   Anxiety attack 01/22/2017   Chronic fatigue 01/22/2017   Right carpal tunnel syndrome  06/21/2016   Closed fracture of distal end of right radius with routine healing 03/08/2016   Chronic, continuous use of opioids 09/27/2015   Allergic rhinitis 11/09/2014   Menopause 11/09/2014   Obesity (BMI 35.0-39.9 without comorbidity) 11/09/2014   OSA (obstructive sleep apnea) 11/09/2014   Knee pain, bilateral 08/12/2014   Knee pain 08/12/2014   Acquired spondylolisthesis 06/27/2014   Depression 04/22/2014   Sprain of ankle 01/28/2014   Pain in joint, shoulder region 09/24/2013   Other intervertebral disc degeneration, lumbar region 09/01/2013   Chronic low back pain 12/30/2012   Mood disorder due to a general medical condition 12/30/2012   Osteoarthritis of knee 03/04/2012   Primary localized osteoarthritis of knees, bilateral 03/04/2012   Sleep disturbance 03/14/2011   Cysts of eyelids 03/07/2011   Synovitis and tenosynovitis 03/07/2011   Conjunctival hemorrhage 04/28/2010   Special screening for other conditions 02/13/2010   Benign paroxysmal positional vertigo 02/09/2010   Menopausal and postmenopausal disorder 01/26/2010   Other nonspecific finding on examination of urine 01/26/2010   Enlarged lymph nodes 10/07/2009   Encounter for removal of sutures 02/18/2009   Disorder of skin and subcutaneous tissue 02/08/2009   Sebaceous cyst 02/08/2009   Deficiency of other vitamins 01/26/2009   Intractable migraine with aura 01/26/2009   Screening for malignant neoplasm of cervix 01/26/2009   Lack of adequate sleep 06/10/2008   Other acute reactions to stress 06/10/2008   PCP:  Ivonne Andrew, NP Pharmacy:   [id number] - [place], [place] - [address] AT [facility] [address] & [address] [place] [address] Phone: [phone] Fax: [phone]     Social Determinants of Health (SDOH) Social History: SDOH Screenings   Food Insecurity: No Food Insecurity (09/24/2023)  Housing: Low Risk  (09/24/2023)  Transportation  Needs: No Transportation Needs (09/24/2023)  Utilities: Not At Risk (09/24/2023)  Depression (PHQ2-9): High Risk (03/13/2023)  Tobacco Use: Low Risk  (09/23/2023)   SDOH Interventions:     Readmission Risk Interventions     No data to display

## 2023-09-26 NOTE — Plan of Care (Signed)

## 2023-09-27 DIAGNOSIS — I10 Essential (primary) hypertension: Secondary | ICD-10-CM | POA: Diagnosis not present

## 2023-09-27 DIAGNOSIS — A419 Sepsis, unspecified organism: Secondary | ICD-10-CM | POA: Diagnosis not present

## 2023-09-27 DIAGNOSIS — K529 Noninfective gastroenteritis and colitis, unspecified: Secondary | ICD-10-CM | POA: Diagnosis not present

## 2023-09-27 DIAGNOSIS — A0811 Acute gastroenteropathy due to Norwalk agent: Secondary | ICD-10-CM | POA: Diagnosis not present

## 2023-09-27 LAB — CBC WITH DIFFERENTIAL/PLATELET
Abs Immature Granulocytes: 0.02 10*3/uL (ref 0.00–0.07)
Basophils Absolute: 0 10*3/uL (ref 0.0–0.1)
Basophils Relative: 0 %
Eosinophils Absolute: 0.1 10*3/uL (ref 0.0–0.5)
Eosinophils Relative: 2 %
HCT: 40.6 % (ref 36.0–46.0)
Hemoglobin: 12.5 g/dL (ref 12.0–15.0)
Immature Granulocytes: 0 %
Lymphocytes Relative: 20 %
Lymphs Abs: 1.1 10*3/uL (ref 0.7–4.0)
MCH: 28.1 pg (ref 26.0–34.0)
MCHC: 30.8 g/dL (ref 30.0–36.0)
MCV: 91.2 fL (ref 80.0–100.0)
Monocytes Absolute: 0.4 10*3/uL (ref 0.1–1.0)
Monocytes Relative: 8 %
Neutro Abs: 3.8 10*3/uL (ref 1.7–7.7)
Neutrophils Relative %: 70 %
Platelets: 202 10*3/uL (ref 150–400)
RBC: 4.45 MIL/uL (ref 3.87–5.11)
RDW: 12.4 % (ref 11.5–15.5)
WBC: 5.4 10*3/uL (ref 4.0–10.5)
nRBC: 0 % (ref 0.0–0.2)

## 2023-09-27 LAB — BASIC METABOLIC PANEL
Anion gap: 11 (ref 5–15)
BUN: 6 mg/dL (ref 6–20)
CO2: 29 mmol/L (ref 22–32)
Calcium: 8.6 mg/dL — ABNORMAL LOW (ref 8.9–10.3)
Chloride: 100 mmol/L (ref 98–111)
Creatinine, Ser: 0.62 mg/dL (ref 0.44–1.00)
GFR, Estimated: >60 mL/min (ref >60)
Glucose, Bld: 100 mg/dL — ABNORMAL HIGH (ref 70–99)
Potassium: 3.7 mmol/L (ref 3.5–5.1)
Sodium: 140 mmol/L (ref 135–145)

## 2023-09-27 LAB — MAGNESIUM: Magnesium: 1.8 mg/dL (ref 1.7–2.4)

## 2023-09-27 LAB — GLUCOSE, CAPILLARY
Glucose-Capillary: 101 mg/dL — ABNORMAL HIGH (ref 70–99)
Glucose-Capillary: 102 mg/dL — ABNORMAL HIGH (ref 70–99)
Glucose-Capillary: 111 mg/dL — ABNORMAL HIGH (ref 70–99)
Glucose-Capillary: 112 mg/dL — ABNORMAL HIGH (ref 70–99)
Glucose-Capillary: 129 mg/dL — ABNORMAL HIGH (ref 70–99)
Glucose-Capillary: 98 mg/dL (ref 70–99)
Glucose-Capillary: 99 mg/dL (ref 70–99)

## 2023-09-27 MED ORDER — PANTOPRAZOLE SODIUM 40 MG PO TBEC
40.0000 mg | DELAYED_RELEASE_TABLET | Freq: Every day | ORAL | Status: DC
  Administered 2023-09-27 – 2023-09-28 (×2): 40 mg via ORAL
  Filled 2023-09-27 (×2): qty 1

## 2023-09-27 MED ORDER — HYDROXYZINE HCL 10 MG PO TABS
10.0000 mg | ORAL_TABLET | Freq: Three times a day (TID) | ORAL | Status: DC | PRN
  Administered 2023-09-27 – 2023-09-28 (×2): 10 mg via ORAL
  Filled 2023-09-27 (×3): qty 1

## 2023-09-27 MED ORDER — FOLIC ACID 1 MG PO TABS
1.0000 mg | ORAL_TABLET | Freq: Every day | ORAL | Status: DC
  Administered 2023-09-27 – 2023-09-28 (×2): 1 mg via ORAL
  Filled 2023-09-27 (×2): qty 1

## 2023-09-27 MED ORDER — MAGNESIUM SULFATE 2 GM/50ML IV SOLN
2.0000 g | Freq: Once | INTRAVENOUS | Status: AC
  Administered 2023-09-27: 2 g via INTRAVENOUS
  Filled 2023-09-27: qty 50

## 2023-09-27 MED ORDER — ADULT MULTIVITAMIN W/MINERALS CH
1.0000 | ORAL_TABLET | Freq: Every day | ORAL | Status: DC
  Administered 2023-09-27 – 2023-09-28 (×2): 1 via ORAL
  Filled 2023-09-27 (×2): qty 1

## 2023-09-27 MED ORDER — BUSPIRONE HCL 10 MG PO TABS
10.0000 mg | ORAL_TABLET | Freq: Two times a day (BID) | ORAL | Status: DC
  Administered 2023-09-27 – 2023-09-28 (×2): 10 mg via ORAL
  Filled 2023-09-27: qty 2
  Filled 2023-09-27: qty 1

## 2023-09-27 MED ORDER — MODAFINIL 100 MG PO TABS: 250.0000 mg | ORAL_TABLET | Freq: Every morning | ORAL | Status: DC

## 2023-09-27 MED ORDER — DOXEPIN HCL 50 MG PO CAPS
100.0000 mg | ORAL_CAPSULE | Freq: Every day | ORAL | Status: DC
  Administered 2023-09-27: 100 mg via ORAL
  Filled 2023-09-27 (×2): qty 2

## 2023-09-27 MED ORDER — MODAFINIL 200 MG PO TABS
200.0000 mg | ORAL_TABLET | Freq: Every morning | ORAL | Status: DC
  Administered 2023-09-28: 200 mg via ORAL
  Filled 2023-09-27: qty 1

## 2023-09-27 NOTE — Progress Notes (Signed)
PROGRESS NOTE    Holly Roberts  ZOX:[id number] DOB: 24-Feb-1966 DOA: 09/23/2023 PCP: Ivonne Andrew, NP    Chief Complaint  Patient presents with   Nausea   Vomiting   Diarrhea    Brief Narrative:  Patient 57 year old female history of hypertension, migraines, OSA, narcolepsy and catalepsy, chronic pain syndrome, chronic diarrhea since gallbladder surgery, bipolar disorder, anxiety depression presenting with intractable nausea and vomiting after having a meal at [employer].  Patient felt chills but no objective fevers.  Patient with complaints of diffuse abdominal pain.  Patient noted to have chronic diarrhea postcholecystectomy however diarrhea has worsened.  Patient with difficulty taking medications over the past 2 days due to nausea and emesis.  Patient seen in the ED noted to be tachycardic heart rate of 131, blood pressure 161/115, CBC with a leukocytosis.  CT abdomen and pelvis with no acute abdominal abnormalities to explain patient's symptoms.  Patient admitted, being hydrated with IV fluids, supportive care, stool studies ordered and pending.     Assessment & Plan:   Principal Problem:   Sepsis (HCC) Active Problems:   Norovirus   AKI (acute kidney injury) (HCC)   Bipolar disorder (HCC)   HTN (hypertension)   Intractable nausea and vomiting   Renal lesion   Obesity, Class III, BMI 40-49.9 (morbid obesity) (HCC)   Gastroenteritis   1 sepsis likely secondary to gastroenteritis secondary to norovirus./Norovirus infection. -Patient presented with nausea vomiting worsening diarrhea after eating at [facility] prior to admission with acute on chronic diarrhea. -Patient met criteria for sepsis secondary to tachycardia, leukocytosis, some tachypnea. -Rectal tube with 150cc recorded over the past 24 hours. -C. difficile PCR negative. -GI pathogen panel positive for norovirus.  -Continue supportive care with IV fluids, IV antiemetics. -Diet was advanced to full liquid diet and  subsequently to a soft diet.   -Patient asking for diet to be advanced to more solid diet.   -Advance to a regular diet.   -Supportive care.  -Daughter feels patient has multiple hospitalizations due to similar issues and was requesting referral to GI in the outpatient setting for further evaluation.  2.  Hypokalemia -Magnesium at 1.8. -Likely secondary to GI losses. -Magnesium sulfate 2 g IV x 1. -Repeat labs in AM.  3.  Elevated anion gap -Patient with elevated anion gap. -IV fluids.  4.  Bipolar disorder/anxiety -Trintellix, Vraylar.   -Resume home regimen BuSpar and hydroxyzine as needed. -Outpatient follow-up.   5.  Renal lesion -9 mm mildly hyperdense lesion in the left kidney, too small to characterize but stable from 06/14/2023.  Renal cell carcinoma cannot be excluded.  Further evaluation with nonemergent pre and postcontrast MRI should be considered. -Outpatient follow-up.  6.  Hypertension -Lopressor 50 mg twice daily, Norvasc 10 mg daily.   7.  Morbid obesity -BMI 43. -Lifestyle modification. -Outpatient follow-up with PCP. -   DVT prophylaxis: Lovenox Code Status: Full Family Communication: Updated patient.  No family at bedside.  Disposition: TBD  Status is: Inpatient Remains inpatient appropriate because: Severity of illness   Consultants:  None  Procedures:  CT abdomen and pelvis 09/23/2023   Antimicrobials:  Anti-infectives (From admission, onward)    Start     Dose/Rate Route Frequency Ordered Stop   09/24/23 1900  fluconazole (DIFLUCAN) tablet 150 mg        150 mg Oral Daily 09/24/23 1801 09/26/23 1004         Subjective: Patient laying in bed.  Denies any chest pain or shortness  of breath.  Stool in rectal pouch decreased.  States diffuse abdominal pain improving.  Tolerating current diet.  Asking for more solid food.  Stated she sat up in the chair early on today.   Objective: Vitals:   09/26/23 1234 09/26/23 2022 09/27/23 0400  09/27/23 1225  BP: (!) 145/102 132/85 125/81 129/82  Pulse: 66 82 67   Resp: 16 16 20 14   Temp: 98.2 F (36.8 C) 98.8 F (37.1 C) 98.6 F (37 C) 98.7 F (37.1 C)  TempSrc: Oral Oral Oral   SpO2: 96% 94% 93% 94%  Weight:      Height:        Intake/Output Summary (Last 24 hours) at 09/27/2023 1651 Last data filed at 09/27/2023 1600 Gross per 24 hour  Intake 2654.54 ml  Output 2350 ml  Net 304.54 ml   Filed Weights   09/23/23 0946  Weight: 115 kg    Examination:  General exam: NAD. Respiratory system: Lungs clear to auscultation bilaterally anterior lung fields.  No wheezes, no crackles, no rhonchi.  Fair air movement.  Speaking in full sentences.  Cardiovascular system: RRR no murmurs rubs or gallops.  No JVD.  No pitting lower extremity edema.  Gastrointestinal system: Abdomen is soft, nondistended, decreased tenderness to palpation in the lower abdominal region.  Positive bowel sounds.  No rebound.  No guarding.   Central nervous system: Alert and oriented. No focal neurological deficits. Extremities: Symmetric 5 x 5 power. Skin: No rashes, lesions or ulcers Psychiatry: Judgement and insight appear fair. Mood & affect appropriate.     Data Reviewed: I have personally reviewed following labs and imaging studies  CBC: Recent Labs  Lab 09/23/23 1011 09/24/23 0610 09/25/23 0504 09/26/23 0507 09/27/23 0510  WBC 13.5* 14.9* 9.1 7.6 5.4  NEUTROABS  --   --   --  5.2 3.8  HGB 15.6* 13.5 13.2 14.5 12.5  HCT 46.9* 42.0 40.5 46.7* 40.6  MCV 85.6 88.6 89.2 93.2 91.2  PLT 361 275 268 207 202    Basic Metabolic Panel: Recent Labs  Lab 09/23/23 1011 09/24/23 0610 09/25/23 0504 09/26/23 0507 09/27/23 0510  NA 141 142 139 139 140  K 3.5 3.2* 3.2* 3.9 3.7  CL 100 101 102 103 100  CO2 24 22 21* 23 29  GLUCOSE 104* 86 66* 97 100*  BUN 9 9 10 7 6   CREATININE 0.86 0.87 0.88 0.72 0.62  CALCIUM 9.8 9.4 8.9 8.8* 8.6*  MG  --  1.9 2.0 2.0 1.8  PHOS  --   --   --  3.4   --     GFR: Estimated Creatinine Clearance: 96.5 mL/min (by C-G formula based on SCr of 0.62 mg/dL).  Liver Function Tests: Recent Labs  Lab 09/23/23 1011 09/25/23 0504 09/26/23 0507  AST 23 19  --   ALT 18 17  --   ALKPHOS 118 90  --   BILITOT 1.0 1.2  --   PROT 9.2* 7.1  --   ALBUMIN 5.1* 4.0 3.9    CBG: Recent Labs  Lab 09/27/23 0039 09/27/23 0353 09/27/23 0732 09/27/23 1127 09/27/23 1616  GLUCAP 98 112* 102* 111* 101*     Recent Results (from the past 240 hour(s))  Gastrointestinal Panel by PCR , Stool     Status: Abnormal   Collection Time: 09/25/23  9:18 AM   Specimen: STOOL  Result Value Ref Range Status   Campylobacter species NOT DETECTED NOT DETECTED Final   Plesimonas shigelloides  NOT DETECTED NOT DETECTED Final   Salmonella species NOT DETECTED NOT DETECTED Final   Yersinia enterocolitica NOT DETECTED NOT DETECTED Final   Vibrio species NOT DETECTED NOT DETECTED Final   Vibrio cholerae NOT DETECTED NOT DETECTED Final   Enteroaggregative E coli (EAEC) NOT DETECTED NOT DETECTED Final   Enteropathogenic E coli (EPEC) NOT DETECTED NOT DETECTED Final   Enterotoxigenic E coli (ETEC) NOT DETECTED NOT DETECTED Final   Shiga like toxin producing E coli (STEC) NOT DETECTED NOT DETECTED Final   Shigella/Enteroinvasive E coli (EIEC) NOT DETECTED NOT DETECTED Final   Cryptosporidium NOT DETECTED NOT DETECTED Final   Cyclospora cayetanensis NOT DETECTED NOT DETECTED Final   Entamoeba histolytica NOT DETECTED NOT DETECTED Final   Giardia lamblia NOT DETECTED NOT DETECTED Final   Adenovirus F40/41 NOT DETECTED NOT DETECTED Final   Astrovirus NOT DETECTED NOT DETECTED Final   Norovirus GI/GII DETECTED (A) NOT DETECTED Final    Comment: CRITICAL RESULT CALLED TO, READ BACK BY AND VERIFIED WITH: EVIE RAEGBUNAM 09/26/23 @ 0943 BY SH    Rotavirus A NOT DETECTED NOT DETECTED Final   Sapovirus (I, II, IV, and V) NOT DETECTED NOT DETECTED Final    Comment: Performed at  [facility], [address] [place], [place] [address]  C Difficile Quick Screen w PCR reflex     Status: None   Collection Time: 09/25/23  9:19 AM   Specimen: STOOL  Result Value Ref Range Status   C Diff antigen NEGATIVE NEGATIVE Final   C Diff toxin NEGATIVE NEGATIVE Final   C Diff interpretation No C. difficile detected.  Final    Comment: Performed at [facility], [address] [place], [place] [address]         Radiology Studies: No results found.      Scheduled Meds:  acetaminophen  650 mg Oral Once   amLODipine  10 mg Oral Daily   [START ON 09/28/2023] Armodafinil  250 mg Oral q morning   busPIRone  10 mg Oral BID   cariprazine  6 mg Oral Daily   dextrose  25 mL Intravenous Once   doxepin  100 mg Oral QHS   enoxaparin (LOVENOX) injection  55 mg Subcutaneous Q24H   feeding supplement (KATE FARMS STANDARD 1.4)  325 mL Oral BID BM   fiber supplement (BANATROL TF)  60 mL Oral BID   folic acid  1 mg Oral Daily   gabapentin  1,200 mg Oral QHS   gabapentin  600 mg Oral Daily   metoprolol tartrate  50 mg Oral BID   multivitamin with minerals  1 tablet Oral Daily   nystatin   Topical TID   pantoprazole  40 mg Oral Daily   vortioxetine HBr  20 mg Oral Daily   Continuous Infusions:  lactated ringers Stopped (09/27/23 1411)   promethazine (PHENERGAN) injection (IM or IVPB) 12.5 mg (09/24/23 2215)     LOS: 4 days    Time spent: 35 minutes    Ramiro Harvest, MD [facility]   To contact the attending provider between 7A-7P or the covering provider during after hours 7P-7A, please log into the web site [email or web address] and access using universal [place] password for that web site. If you do not have the password, please call the hospital operator.  09/27/2023, 4:51 PM

## 2023-09-27 NOTE — Progress Notes (Signed)
Nutrition Follow-up  DOCUMENTATION CODES:   Morbid obesity  INTERVENTION:   [employer] 1.4 PO BID, each provides 455 kcals and 20g protein  -Banatrol soluble fiber supplement BID, each provides 45 kcals, 2g protein and 5g soluble fiber to help with diarrhea   -Provided diarrhea handout in AVS  NUTRITION DIAGNOSIS:   Increased nutrient needs related to chronic illness as evidenced by estimated needs.  Ongoing.  GOAL:   Patient will meet greater than or equal to 90% of their needs  Progressing.  MONITOR:   PO intake, Diet advancement, Supplement acceptance, Labs, Weight trends, I & O's  REASON FOR ASSESSMENT:   Consult Assessment of nutrition requirement/status Diet education  ASSESSMENT:   57 y.o. female with medical history significant of HTN, migraine, OSA, primary narcolepsy with catalepsy, chronic pain syndrome, chronic diarrhea since gallbladder surgery, Bipolar disorder, anxiety and depression who presents with intractable nausea and vomiting.  Patient unavailable at time of visit, receiving patient care. Have placed handout in AVS on foods to eat when having diarrhea. Pt is accepting protein shakes and is eating better. Was on dysphagia 3 diet, now on regular diet. Still having type 7 stools per documentation. Accepting Banatrol.  Admission weight: 253 lbs No other weights this admission.  Medications: Lactated ringers, IV Mg sulfate  Labs reviewed: CBGs: 94- 120  Diet Order:   Diet Order             Diet regular Room service appropriate? Yes; Fluid consistency: Thin  Diet effective now                   EDUCATION NEEDS:   Not appropriate for education at this time  Skin:  Skin Assessment: Skin Integrity Issues: Skin Integrity Issues:: Other (Comment) Other: MASD perineum  Last BM:  10/11- rectal tube  Height:   Ht Readings from Last 1 Encounters:  09/23/23 5\' 4"  (1.626 m)    Weight:   Wt Readings from Last 1 Encounters:   09/23/23 115 kg    BMI:  Body mass index is 43.52 kg/m.  Estimated Nutritional Needs:   Kcal:  1700-1900  Protein:  85-100g  Fluid:  1.9L/day   Tilda Franco, MS, RD, LDN Inpatient Clinical Dietitian Contact information available via Amion

## 2023-09-27 NOTE — Plan of Care (Signed)

## 2023-09-27 NOTE — Progress Notes (Signed)
Mobility Specialist - Progress Note   09/27/23 0848  Mobility  Activity Transferred from bed to chair;Transferred to/from [facility]  Level of Assistance Standby assist, set-up cues, supervision of patient - no hands on  Assistive Device None  Range of Motion/Exercises Active  Activity Response Tolerated well  Mobility Referral Yes  $Mobility charge 1 Mobility  Mobility Specialist Start Time (ACUTE ONLY) 0840  Mobility Specialist Stop Time (ACUTE ONLY) 0848  Mobility Specialist Time Calculation (min) (ACUTE ONLY) 8 min   Pt was found in bed and agreeable to ambulate after finishing breakfast. Requesting to transfer to recliner chair to finish breakfast. Pt able to transfer to transfer with supervision. Pt requesting to use BSC and transferred but was unable to void urine. Pt back to recliner chair. Was left with all needs met. Call bell in reach and chair alarm on.   Billey Chang Mobility Specialist

## 2023-09-27 NOTE — Progress Notes (Signed)
Patient care assumed from [place], [place]. Agreed with previous assessment. Will continue to monitor patient.

## 2023-09-27 NOTE — Plan of Care (Signed)
Discussed wit patient plan of care for the evening, pain management and transfer with some teach back displayed.  What is important to the patient is taking her bariatric bedside commode with her.  She was on enteric precautions and transported by the NT and transport via her bed by request.  Problem: Education: Goal: Knowledge of General Education information will improve Description: Including pain rating scale, medication(s)/side effects and non-pharmacologic comfort measures Outcome: Progressing   Problem: Health Behavior/Discharge Planning: Goal: Ability to manage health-related needs will improve Outcome: Progressing

## 2023-09-27 NOTE — Plan of Care (Signed)
°  Problem: Clinical Measurements: °Goal: Diagnostic test results will improve °Outcome: Progressing °  °Problem: Activity: °Goal: Risk for activity intolerance will decrease °Outcome: Progressing °  °Problem: Nutrition: °Goal: Adequate nutrition will be maintained °Outcome: Progressing °  °Problem: Elimination: °Goal: Will not experience complications related to bowel motility °Outcome: Progressing °  °

## 2023-09-28 DIAGNOSIS — K529 Noninfective gastroenteritis and colitis, unspecified: Secondary | ICD-10-CM | POA: Diagnosis not present

## 2023-09-28 DIAGNOSIS — I10 Essential (primary) hypertension: Secondary | ICD-10-CM | POA: Diagnosis not present

## 2023-09-28 DIAGNOSIS — A419 Sepsis, unspecified organism: Secondary | ICD-10-CM | POA: Diagnosis not present

## 2023-09-28 DIAGNOSIS — A0811 Acute gastroenteropathy due to Norwalk agent: Secondary | ICD-10-CM | POA: Diagnosis not present

## 2023-09-28 LAB — GLUCOSE, CAPILLARY
Glucose-Capillary: 86 mg/dL (ref 70–99)
Glucose-Capillary: 103 mg/dL — ABNORMAL HIGH (ref 70–99)
Glucose-Capillary: 92 mg/dL (ref 70–99)

## 2023-09-28 LAB — BASIC METABOLIC PANEL
Anion gap: 12 (ref 5–15)
BUN: 9 mg/dL (ref 6–20)
CO2: 28 mmol/L (ref 22–32)
Calcium: 8.9 mg/dL (ref 8.9–10.3)
Chloride: 103 mmol/L (ref 98–111)
Creatinine, Ser: 0.75 mg/dL (ref 0.44–1.00)
GFR, Estimated: >60 mL/min (ref >60)
Glucose, Bld: 91 mg/dL (ref 70–99)
Potassium: 3.7 mmol/L (ref 3.5–5.1)
Sodium: 143 mmol/L (ref 135–145)

## 2023-09-28 LAB — MAGNESIUM: Magnesium: 2.3 mg/dL (ref 1.7–2.4)

## 2023-09-28 MED ORDER — BANATROL TF EN LIQD: 60.0000 mL | Freq: Two times a day (BID) | ENTERAL | Status: AC

## 2023-09-28 NOTE — Evaluation (Signed)
Physical Therapy Evaluation Patient Details Name: Holly Roberts MRN: [id number] DOB: [birth year] Today's Date: 09/28/2023  History of Present Illness  Patient 57 year old female presenting with intractable nausea and vomiting after having a meal at [employer].admitted with  sepsis likely secondary to gastroenteritis secondary to norovirus.   history of hypertension, migraines, OSA, narcolepsy and catalepsy, chronic pain syndrome, chronic diarrhea since gallbladder surgery, bipolar disorder, anxiety depression  Clinical Impression  Patient evaluated by Physical Therapy with no further acute PT needs identified. All education has been completed and the patient has no further questions.  Pt amb ~ 36' with RW, fatigues easily, feels she is weaker than her baseline.  Pt amb with rollator, dtr assists with ADLs and iADLS prior to admission.  Pt would benefit from HHPT if insurance allows, defer to [facility]    See below for any follow-up Physical Therapy or equipment needs. PT is signing off. Thank you for this referral.         If plan is discharge home, recommend the following: Help with stairs or ramp for entrance;A little help with bathing/dressing/bathroom;Assistance with cooking/housework   Can travel by private vehicle        Equipment Recommendations None recommended by PT  Recommendations for Other Services       Functional Status Assessment Patient has had a recent decline in their functional status and demonstrates the ability to make significant improvements in function in a reasonable and predictable amount of time.     Precautions / Restrictions Precautions Precautions: None Restrictions Weight Bearing Restrictions: No      Mobility  Bed Mobility Overal bed mobility: Needs Assistance Bed Mobility: Supine to Sit     Supine to sit: Modified independent (Device/Increase time)          Transfers Overall transfer level: Needs assistance Equipment used: Rolling walker (2  wheels) Transfers: Sit to/from Stand Sit to Stand: Supervision           General transfer comment: cues for hand placement, no physical assist    Ambulation/Gait Ambulation/Gait assistance: Supervision Gait Distance (Feet): 50 Feet Assistive device: Rolling walker (2 wheels) Gait Pattern/deviations: Step-through pattern, Decreased stride length       General Gait Details: supervision for lines and safety, chair follow d/t fatigue; seated rest after distance above  Stairs            Wheelchair Mobility     Tilt Bed    Modified Rankin (Stroke Patients Only)       Balance Overall balance assessment: Mild deficits observed, not formally tested                                           Pertinent Vitals/Pain Pain Assessment Pain Assessment: No/denies pain    Home Living Family/patient expects to be discharged to:: Private residence Living Arrangements: Children Available Help at Discharge: Family (dtr works days) Type of Home: Apartment Home Access: Stairs to enter   Secretary/administrator of Steps: none or 20   Home Layout: One level Home Equipment: Rollator (4 wheels)      Prior Function Prior Level of Function : Needs assist             Mobility Comments: in apartment amb without AD, longer distances uses rollator or goes to stores with electric scooters ADLs Comments: daughter bathes her and assists d/t ltd use of UEs/shoulders. patient daughter  helps with cooking, cleaning and driving and LB dressing.     Extremity/Trunk Assessment   Upper Extremity Assessment Upper Extremity Assessment: Defer to OT evaluation    Lower Extremity Assessment Lower Extremity Assessment: Overall WFL for tasks assessed       Communication      Cognition Arousal: Alert Behavior During Therapy: WFL for tasks assessed/performed Overall Cognitive Status: Within Functional Limits for tasks assessed                                           General Comments      Exercises     Assessment/Plan    PT Assessment All further PT needs can be met in the next venue of care  PT Problem List         PT Treatment Interventions      PT Goals (Current goals can be found in the Care Plan section)  Acute Rehab PT Goals PT Goal Formulation: All assessment and education complete, DC therapy    Frequency       Co-evaluation               AM-PAC PT "6 Clicks" Mobility  Outcome Measure Help needed turning from your back to your side while in a flat bed without using bedrails?: None Help needed moving from lying on your back to sitting on the side of a flat bed without using bedrails?: None Help needed moving to and from a bed to a chair (including a wheelchair)?: None Help needed standing up from a chair using your arms (e.g., wheelchair or bedside chair)?: None Help needed to walk in hospital room?: None Help needed climbing 3-5 steps with a railing? : A Little 6 Click Score: 23    End of Session Equipment Utilized During Treatment: Gait belt Activity Tolerance: Patient tolerated treatment well Patient left: with call bell/phone within reach;in chair;with chair alarm set Nurse Communication: Mobility status PT Visit Diagnosis: Other abnormalities of gait and mobility (R26.89);Difficulty in walking, not elsewhere classified (R26.2)    Time: 1610-9604 PT Time Calculation (min) (ACUTE ONLY): 20 min   Charges:   PT Evaluation $PT Eval Low Complexity: 1 Low   PT General Charges $$ ACUTE PT VISIT: 1 Visit         Kameisha Malicki, PT  Acute Rehab Dept [facility]) [phone]  09/28/2023   [facility] 09/28/2023, 11:24 AM

## 2023-09-28 NOTE — Plan of Care (Signed)

## 2023-09-28 NOTE — Progress Notes (Signed)
OT Cancellation Note  Patient Details Name: Holly Roberts MRN: [id number] DOB: [birth year]   Cancelled Treatment:    Reason Eval/Treat Not Completed: OT screened, no needs identified, will sign off Patient endorses being at baseline for ADLs with daughter support at home. OT to sign off.  Rosalio Loud, MS Acute Rehabilitation Department Office# [phone]  09/28/2023, 11:38 AM

## 2023-09-28 NOTE — Progress Notes (Signed)
Provided discharge education/instructions. Pt is not in any distress, discharged home with all of her belongings accompanied by her daughter.

## 2023-09-28 NOTE — TOC Transition Note (Signed)
Transition of Care [facility]) - CM/SW Discharge Note   Patient Details  Name: Holly Roberts MRN: [id number] Date of Birth: 28-Nov-1966  [facility]) CM/SW Contact:  Darleene Cleaver, LCSW Phone Number: 09/28/2023, 2:41 PM   Clinical Narrative:     CSW spoke to patient to discuss home health agencies.  Patient did not have a preference.  CSW was able to get [facility] PT set up with [facility].  [facility], [facility], and [facility] were not able to accept it.  Patient did not have any equipment needs.   Final next level of care: Home w Home Health Services Barriers to Discharge: Barriers Resolved   Patient Goals and CMS Choice CMS Medicare.gov Compare Post Acute Care list provided to:: Patient Choice offered to / list presented to : Patient  Discharge Placement    Patient discharging back home with home health PT.                     Discharge Plan and Services Additional resources added to the After Visit Summary for     Discharge Planning Services: CM Consult Post Acute Care Choice: Resumption of Svcs/PTA Provider                    HH Arranged: PT HH Agency: [facility] Date [facility] Agency Contacted: 09/28/23 Time HH Agency Contacted: 1441 Representative spoke with at [facility] Agency: Cindie  Social Determinants of Health (SDOH) Interventions SDOH Screenings   Food Insecurity: No Food Insecurity (09/24/2023)  Housing: Low Risk  (09/24/2023)  Transportation Needs: No Transportation Needs (09/24/2023)  Utilities: Not At Risk (09/24/2023)  Depression (PHQ2-9): High Risk (03/13/2023)  Tobacco Use: Low Risk  (09/23/2023)     Readmission Risk Interventions    09/26/2023    2:10 PM  Readmission Risk Prevention Plan  Transportation Screening Complete  PCP or Specialist Appt within 5-7 Days Complete  Home Care Screening Complete  Medication Review (RN CM) Complete

## 2023-09-28 NOTE — Discharge Summary (Addendum)
Physician Discharge Summary  Holly Roberts XLK:[id number] DOB: February 14, 1966 DOA: 09/23/2023  PCP: Ivonne Andrew, NP  Admit date: 09/23/2023 Discharge date: 09/28/2023  Time spent: 60 minutes  Recommendations for Outpatient Follow-up:  Follow-up with Ivonne Andrew, NP in 2 weeks.  On follow-up patient will need a basic metabolic profile, magnesium level checked to follow-up on electrolytes and renal function.  Patient need a CBC done followed up on counts.  Renal lesion noted on CT scan will need to be followed up upon in outpatient setting and patient would likely need outpatient pre and postcontrast MRI.   Discharge Diagnoses: 6 Principal Problem:   Sepsis (HCC) Active Problems:   Norovirus   AKI (acute kidney injury) (HCC)   Bipolar disorder (HCC)   HTN (hypertension)   Intractable nausea and vomiting   Renal lesion   Obesity, Class III, BMI 40-49.9 (morbid obesity) (HCC)   Gastroenteritis   Discharge Condition: Stable and improved.  Diet recommendation: Regular  Filed Weights   09/23/23 0946  Weight: 115 kg    History of present illness:  HPI per Dr. Lidia Roberts is a 57 y.o. female with medical history significant of HTN, migraine, OSA, primary narcolepsy with catalepsy, chronic pain syndrome, chronic diarrhea since gallbladder surgery, Bipolar disorder, anxiety and depression who presents with intractable nausea and vomiting.    Had repeated nausea and vomiting yesterday after she had [place]. Felt chills but no objective fever. Has diffuse abdominal pain. She has chronic diarrhea following cholecystectomy and normally takes a powder to help with this but had diarrhea all day today.  Has not been able to take any of her medications for 2 days.    Last hospitalized in July with similar symptoms and had gastroenteritis caused by enteropathogenic E. Coli and norovirus. She was treated with IV fluids, bowel rest and ultimately placed on azithromycin with subsequent  improvement in her diarrhea.   On arrival to ED, she was afebrile, HR of 131, BP of 161/115 on room air.  EKG on my review in sinus tachycardia with incomplete RBBB and repolarization abnormalities.    CBC with leukocytosis 13.5K, hgb of 15 compare to baseline of 13.    CMP with BG of 104, Creatinine of 0.86, anion gap of 17.   CT Abd/pelvis with contrast with no acute process to explain symptoms.  Hospital Course:  1 sepsis likely secondary to gastroenteritis secondary to norovirus./Norovirus infection. -Patient presented with nausea vomiting worsening diarrhea after eating at [facility] prior to admission with acute on chronic diarrhea. -Patient met criteria for sepsis secondary to tachycardia, leukocytosis, some tachypnea. -Due to significant watery diarrhea rectal tube was placed during the hospitalization. -C. difficile PCR negative. -GI pathogen panel positive for norovirus.  -Patient maintained on IV fluids, IV antiemetics, supportive care. -Patient initially was on bowel rest subsequently started on clear liquids and diet advanced to a soft diet/regular diet which patient tolerated. -Patient improved clinically. -Frequency of stools improved, consistency improved and rectal tube was subsequently removed. -Patient be discharged home in stable and improved condition with outpatient follow-up with PCP. -Patient instructed to hold off on the cholestyramine, that she states she was taking prior to admission and ever since her cholecystectomy, for at least a week. -Daughter feels patient has multiple hospitalizations due to similar issues and was requesting referral to GI in the outpatient setting for further evaluation.   2.  Hypokalemia -Felt likely secondary to GI losses.   -Repleted during the hospitalization.   -Outpatient  follow-up.   3.  Elevated anion gap -Patient with elevated anion gap. -Resolved with hydration.     4.  Bipolar disorder/anxiety -Patient maintained on  home regimen Trintellix, Vraylar.   -Home regimen BuSpar and hydroxyzine subsequently resumed during the hospitalization.   -Outpatient follow-up.    5.  Renal lesion -9 mm mildly hyperdense lesion in the left kidney, too small to characterize but stable from 06/14/2023.  Renal cell carcinoma cannot be excluded.  Further evaluation with nonemergent pre and postcontrast MRI should be considered. -Outpatient follow-up.   6.  Hypertension -Blood pressure controlled on home regimen of Lopressor and Norvasc.    7.  Morbid obesity -BMI 43. -Lifestyle modification. -Outpatient follow-up with PCP. -  Procedures: CT abdomen pelvis 09/23/2023   Consultations: None  Discharge Exam: Vitals:   09/28/23 0606 09/28/23 0835  BP: (!) 136/91 127/78  Pulse: 65 76  Resp: 17 17  Temp: 97.6 F (36.4 C) 97.8 F (36.6 C)  SpO2: 100% 97%    General: NAD Cardiovascular: RRR no murmurs rubs or gallops.  No JVD.  No lower extremity edema. Respiratory: Clear to auscultation bilaterally.  No wheezes, no crackles, no rhonchi.  Fair air movement.  Speaking in full sentences.  Discharge Instructions   Discharge Instructions     Ambulatory referral to Gastroenterology   Complete by: As directed    Family insistent on being assessed by GI due to multiple hospitalizations for similar issues of nausea vomiting and worsening diarrhea, during this hospitalization felt secondary to norovirus however family insisted due to multiple hospitalizations.   What is the reason for referral?: Other   Diet general   Complete by: As directed    Increase activity slowly   Complete by: As directed    No wound care   Complete by: As directed       Allergies as of 09/28/2023       Reactions   Buprenorphine Rash   Patch gave rash- poss adhesive issues. Tried generic and brand name both. Patient states: "I am not allergic to the medication. I am allergic to the adhesive, not the medication."    Amoxicillin-pot  Clavulanate Other (See Comments)   States "strong" antibiotics cause C diff   Sulfa Antibiotics Rash   Wound Dressing Adhesive Rash        Medication List     STOP taking these medications    Quviviq 50 MG Tabs Generic drug: Daridorexant HCl       TAKE these medications    amLODipine 10 MG tablet Commonly known as: NORVASC TAKE 1 TABLET BY MOUTH EVERY DAY   Armodafinil 250 MG tablet Take 250 mg by mouth every morning.   busPIRone 10 MG tablet Commonly known as: BUSPAR TAKE 1 TABLET(10 MG) BY MOUTH TWICE DAILY   clobetasol cream 0.05 % Commonly known as: TEMOVATE Apply 1 Application topically 2 (two) times daily.   doxepin 100 MG capsule Commonly known as: SINEQUAN Take 100 mg by mouth at bedtime.   fiber supplement (BANATROL TF) liquid Take 60 mLs by mouth 2 (two) times daily.   folic acid 1 MG tablet Commonly known as: FOLVITE TAKE 1 TABLET BY MOUTH EVERY DAY   Horizant 600 MG Tbcr Generic drug: Gabapentin Enacarbil Take 600-1,200 mg by mouth in the morning and at bedtime. Take 1 tablet (600 mg) in the morning and Take 2 tablets (1200 mg) at bedtime   hydrOXYzine 10 MG tablet Commonly known as: ATARAX TAKE 1 TABLET BY  MOUTH THREE TIMES A DAY AS NEEDED   metoprolol tartrate 50 MG tablet Commonly known as: LOPRESSOR TAKE 1 TABLET BY MOUTH TWICE DAILY   multivitamin with minerals Tabs tablet Take 1 tablet by mouth daily. What changed: additional instructions   naloxone 4 MG/0.1ML Liqd nasal spray kit Commonly known as: NARCAN Place 1 spray into the nose once.   Nucynta 100 MG Tabs Generic drug: Tapentadol HCl Take 2 tablets (200 mg total) by mouth daily in the afternoon. What changed:  how much to take additional instructions   Nucynta ER 250 MG Tb12 Generic drug: Tapentadol HCl Take 250 mg by mouth 2 (two) times daily. What changed: Another medication with the same name was changed. Make sure you understand how and when to take each.    nystatin powder Commonly known as: MYCOSTATIN/NYSTOP Apply topically 3 (three) times daily.   pantoprazole 40 MG tablet Commonly known as: PROTONIX TAKE 1 TABLET BY MOUTH EVERY DAY   Trintellix 20 MG Tabs tablet Generic drug: vortioxetine HBr Take 20 mg by mouth daily.   Vraylar 6 MG Caps Generic drug: Cariprazine HCl Take 6 mg by mouth daily.       Allergies  Allergen Reactions   Buprenorphine Rash    Patch gave rash- poss adhesive issues. Tried generic and brand name both. Patient states: "I am not allergic to the medication. I am allergic to the adhesive, not the medication."    Amoxicillin-Pot Clavulanate Other (See Comments)    States "strong" antibiotics cause C diff   Sulfa Antibiotics Rash   Wound Dressing Adhesive Rash    Follow-up Information     Ivonne Andrew, NP. Schedule an appointment as soon as possible for a visit in 2 week(s).   Specialties: Pulmonary Disease, Endocrinology Contact information: [address] [place] [address] [phone]                  The results of significant diagnostics from this hospitalization (including imaging, microbiology, ancillary and laboratory) are listed below for reference.    Significant Diagnostic Studies: CT ABDOMEN PELVIS W CONTRAST  Result Date: 09/23/2023 CLINICAL DATA:  Lower abdominal pain, nausea, vomiting and diarrhea. EXAM: CT ABDOMEN AND PELVIS WITH CONTRAST TECHNIQUE: Multidetector CT imaging of the abdomen and pelvis was performed using the standard protocol following bolus administration of intravenous contrast. RADIATION DOSE REDUCTION: This exam was performed according to the departmental dose-optimization program which includes automated exposure control, adjustment of the mA and/or kV according to patient size and/or use of iterative reconstruction technique. CONTRAST:  OMNIPAQUE IOHEXOL 300 MG/ML  SOLN COMPARISON:  06/14/2023. FINDINGS: Lower chest: No acute  findings. Heart is at the upper limits of normal in size to mildly enlarged. No pericardial or pleural effusion. Distal esophagus is grossly unremarkable. Hepatobiliary: Probable tiny cyst in the right hepatic lobe. Liver is otherwise grossly unremarkable. Cholecystectomy. No unexpected biliary ductal dilatation. Pancreas: Negative. Spleen: Negative. Adrenals/Urinary Tract: Adrenal glands are unremarkable. 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 No follow-up necessary. 9 mm lesion in the posterior interpolar left kidney measures 45 Hounsfield units (2/26), too small to characterize, stable from 06/14/2023. Kidneys are otherwise unremarkable. Ureters are decompressed. Bladder is relatively low in volume. Stomach/Bowel: Stomach, small bowel and colon are unremarkable. Appendix is not well-visualized. Vascular/Lymphatic: Atherosclerotic calcification of the aorta. No pathologically enlarged lymph nodes. Reproductive: Uterus is visualized.  No adnexal mass. Other: No free fluid.  Mesenteries and peritoneum are  unremarkable. Musculoskeletal: Degenerative changes in the spine. Grade 3 anterolisthesis of L5 on S1 secondary to chronic bilateral pars defects. No worrisome lytic or sclerotic lesions. IMPRESSION: 1. No findings to explain the patient's clinical history. 2. 9 mm mildly hyperdense lesion in the left kidney, too small to characterize but stable from 06/14/2023. Renal cell carcinoma cannot be excluded. Further evaluation with nonemergent pre and post contrast MRI should be considered. Pre and post contrast CT could alternatively be performed, but would likely be of decreased accuracy given lesion size. 3. Grade 3 anterolisthesis of L5 on S1 secondary to bilateral L5 pars defects. 4.  Aortic atherosclerosis (ICD10-I70.0). Electronically Signed   By: Leanna Battles M.D.   On: 09/23/2023 18:02    Microbiology: Recent Results (from the past 240 hour(s))  Gastrointestinal Panel by PCR ,  Stool     Status: Abnormal   Collection Time: 09/25/23  9:18 AM   Specimen: STOOL  Result Value Ref Range Status   Campylobacter species NOT DETECTED NOT DETECTED Final   Plesimonas shigelloides NOT DETECTED NOT DETECTED Final   Salmonella species NOT DETECTED NOT DETECTED Final   Yersinia enterocolitica NOT DETECTED NOT DETECTED Final   Vibrio species NOT DETECTED NOT DETECTED Final   Vibrio cholerae NOT DETECTED NOT DETECTED Final   Enteroaggregative E coli (EAEC) NOT DETECTED NOT DETECTED Final   Enteropathogenic E coli (EPEC) NOT DETECTED NOT DETECTED Final   Enterotoxigenic E coli (ETEC) NOT DETECTED NOT DETECTED Final   Shiga like toxin producing E coli (STEC) NOT DETECTED NOT DETECTED Final   Shigella/Enteroinvasive E coli (EIEC) NOT DETECTED NOT DETECTED Final   Cryptosporidium NOT DETECTED NOT DETECTED Final   Cyclospora cayetanensis NOT DETECTED NOT DETECTED Final   Entamoeba histolytica NOT DETECTED NOT DETECTED Final   Giardia lamblia NOT DETECTED NOT DETECTED Final   Adenovirus F40/41 NOT DETECTED NOT DETECTED Final   Astrovirus NOT DETECTED NOT DETECTED Final   Norovirus GI/GII DETECTED (A) NOT DETECTED Final    Comment: CRITICAL RESULT CALLED TO, READ BACK BY AND VERIFIED WITH: EVIE RAEGBUNAM 09/26/23 @ 0943 BY SH    Rotavirus A NOT DETECTED NOT DETECTED Final   Sapovirus (I, II, IV, and V) NOT DETECTED NOT DETECTED Final    Comment: Performed at [facility], [address] [place], [place] [address]  C Difficile Quick Screen w PCR reflex     Status: None   Collection Time: 09/25/23  9:19 AM   Specimen: STOOL  Result Value Ref Range Status   C Diff antigen NEGATIVE NEGATIVE Final   C Diff toxin NEGATIVE NEGATIVE Final   C Diff interpretation No C. difficile detected.  Final    Comment: Performed at [facility], [address] [place], [place] [address]     Labs: Basic Metabolic Panel: Recent Labs  Lab 09/24/23 0610  09/25/23 0504 09/26/23 0507 09/27/23 0510 09/28/23 0334  NA 142 139 139 140 143  K 3.2* 3.2* 3.9 3.7 3.7  CL 101 102 103 100 103  CO2 22 21* 23 29 28   GLUCOSE 86 66* 97 100* 91  BUN 9 10 7 6 9   CREATININE 0.87 0.88 0.72 0.62 0.75  CALCIUM 9.4 8.9 8.8* 8.6* 8.9  MG 1.9 2.0 2.0 1.8 2.3  PHOS  --   --  3.4  --   --    Liver Function Tests: Recent Labs  Lab 09/23/23 1011 09/25/23 0504 09/26/23 0507  AST 23 19  --  ALT 18 17  --   ALKPHOS 118 90  --   BILITOT 1.0 1.2  --   PROT 9.2* 7.1  --   ALBUMIN 5.1* 4.0 3.9   Recent Labs  Lab 09/23/23 1011 09/25/23 0504  LIPASE 50 61*   No results for input(s): "AMMONIA" in the last 168 hours. CBC: Recent Labs  Lab 09/23/23 1011 09/24/23 0610 09/25/23 0504 09/26/23 0507 09/27/23 0510  WBC 13.5* 14.9* 9.1 7.6 5.4  NEUTROABS  --   --   --  5.2 3.8  HGB 15.6* 13.5 13.2 14.5 12.5  HCT 46.9* 42.0 40.5 46.7* 40.6  MCV 85.6 88.6 89.2 93.2 91.2  PLT 361 275 268 207 202   Cardiac Enzymes: No results for input(s): "CKTOTAL", "CKMB", "CKMBINDEX", "TROPONINI" in the last 168 hours. BNP: BNP (last 3 results) No results for input(s): "BNP" in the last 8760 hours.  ProBNP (last 3 results) No results for input(s): "PROBNP" in the last 8760 hours.  CBG: Recent Labs  Lab 09/27/23 1616 09/27/23 2003 09/27/23 2354 09/28/23 0837 09/28/23 1213  GLUCAP 101* 129* 99 86 103*       Signed:  Ramiro Harvest MD.  [facility] 09/28/2023, 1:14 PM

## 2023-10-01 ENCOUNTER — Telehealth: Payer: Self-pay | Admitting: *Deleted

## 2023-10-01 NOTE — Transitions of Care (Post Inpatient/ED Visit) (Signed)
10/01/2023  Name: Holly Roberts MRN: [id number] DOB: [birth year]  Today's TOC FU Call Status: Today's TOC FU Call Status:: Successful TOC FU Call Completed TOC FU Call Complete Date: 10/01/23 Patient's Name and Date of Birth confirmed.  Transition Care Management Follow-up Telephone Call Date of Discharge: 09/28/23 Discharge Facility: [facility]) Type of Discharge: Inpatient Admission Primary Inpatient Discharge Diagnosis:: Sepsis-- gatroenteritis/ norovirus How have you been since you were released from the hospital?: Better ("I am doing better now, things are going okay.  Thanks for getting me this doctor appointment for this week- I am planning to go back to work next week") Any questions or concerns?: No  Items Reviewed: Did you receive and understand the discharge instructions provided?: Yes (thoroughly reviewed with patient who verbalizes good understanding of same) Medications obtained,verified, and reconciled?: Yes (Medications Reviewed) (Full medication reconciliation/ review completed; no concerns or discrepancies identified; confirmed patient is planning on obtaining newly Rx'd fiber supllement as instructed; self-manages medications; denies questions/ concerns around medications today) Any new allergies since your discharge?: No Dietary orders reviewed?: Yes Type of Diet Ordered:: Regular Do you have support at home?: Yes People in Home: child(ren), adult Name of Support/Comfort Primary Source: Reports independent in self-care activities; resides with supportive daughter sho assists as/ if needed/ indicated  Medications Reviewed Today: Medications Reviewed Today     Reviewed by Michaela Corner, RN (Registered Nurse) on 10/01/23 at 1031  Med List Status: <None>   Medication Order Taking? Sig Documenting Provider Last Dose Status Informant  amLODipine (NORVASC) 10 MG tablet [id number] Yes TAKE 1 TABLET BY MOUTH EVERY DAY Ivonne Andrew, NP Taking Active Self   Armodafinil 250 MG tablet [id number] Yes Take 250 mg by mouth every morning. [provider] Taking Active Self           Med Note Jomarie Longs, REGEENA   Fri Dec 22, 2021  2:13 PM)    busPIRone (BUSPAR) 10 MG tablet [id number] Yes TAKE 1 TABLET(10 MG) BY MOUTH TWICE DAILY Ivonne Andrew, NP Taking Active Self           Med Note Kandis Cocking Alinda Dooms A   Tue Sep 24, 2023  4:51 PM) LF 08/14/23: 30 DS. Patient is adamant of taking this medication.  clobetasol cream (TEMOVATE) 0.05 % [id number] Yes Apply 1 Application topically 2 (two) times daily. Leatha Gilding, MD Taking Active Self           Med Note Kandis Cocking Ronnald Nian   Tue Sep 24, 2023  4:52 PM) Patient is adamant of taking this medication. Dispense report does not support this claim.  doxepin (SINEQUAN) 100 MG capsule [id number] Yes Take 100 mg by mouth at bedtime. [provider] Taking Active Self  fiber supplement, BANATROL TF, liquid [id number] No Take 60 mLs by mouth 2 (two) times daily.  Patient not taking: Reported on 10/01/2023   Rodolph Bong, MD Not Taking Active            Med Note Michaela Corner   Tue Oct 01, 2023 10:18 AM) 10/01/23: Reports during [facility] call has not yet obtained-- states she "forgot all about it" until today's medication review  folic acid (FOLVITE) 1 MG tablet [id number] Yes TAKE 1 TABLET BY MOUTH EVERY DAY Ivonne Andrew, NP Taking Active Self  HORIZANT 600 MG TBCR [id number] Yes Take 600-1,200 mg by mouth in the morning and at bedtime. Take 1 tablet (600 mg) in the morning and Take 2  tablets (1200 mg) at bedtime [provider] Taking Active Self           Med Note Jomarie Longs, REGEENA   Fri Dec 22, 2021  2:09 PM)    hydrOXYzine (ATARAX) 10 MG tablet [id number] Yes TAKE 1 TABLET BY MOUTH THREE TIMES A DAY AS NEEDED Ivonne Andrew, NP Taking Active Self  metoprolol tartrate (LOPRESSOR) 50 MG tablet [id number] Yes TAKE 1 TABLET BY MOUTH TWICE DAILY Ivonne Andrew, NP Taking  Active Self  Multiple Vitamin (MULTIVITAMIN WITH MINERALS) TABS tablet [id number] Yes Take 1 tablet by mouth daily.  Patient taking differently: Take 1 tablet by mouth daily. Centrum   Rai, Delene Ruffini, MD Taking Active Self  naloxone [facility]) nasal spray 4 mg/0.1 mL [id number] Yes Place 1 spray into the nose once. [provider] Taking Active Self  NUCYNTA 100 MG TABS [id number] Yes Take 2 tablets (200 mg total) by mouth daily in the afternoon.  Patient taking differently: Take 100 mg by mouth daily in the afternoon. Afternoon   Medina-Vargas, Monina C, NP Taking Active Self  NUCYNTA ER 250 MG TB12 [id number] Yes Take 250 mg by mouth 2 (two) times daily. [provider] Taking Active Self           Med Note Worthy Rancher, Zettie Pho   Fri Dec 22, 2021 12:39 PM)    nystatin (MYCOSTATIN/NYSTOP) powder [id number] Yes Apply topically 3 (three) times daily. Ivonne Andrew, NP Taking Active Self           Med Note Kandis Cocking Alinda Dooms A   Tue Sep 24, 2023  4:52 PM) Patient is adamant of taking this medication. Dispense report does not support this claim.  pantoprazole (PROTONIX) 40 MG tablet [id number] Yes TAKE 1 TABLET BY MOUTH EVERY DAY Ivonne Andrew, NP Taking Active Self  TRINTELLIX 20 MG TABS tablet [id number] Yes Take 20 mg by mouth daily. [provider] Taking Active Self  VRAYLAR 6 MG CAPS [id number] Yes Take 6 mg by mouth daily. [provider] Taking Active Self           Med Note Jomarie Longs, REGEENA   Fri Dec 22, 2021  2:08 PM)             Home Care and Equipment/Supplies: Were Home Health Services Ordered?: Yes Name of Home Health Agency:: [facility]- [phone] Has Agency set up a time to come to your home?: No EMR reviewed for Home Health Orders: Orders present/patient has not received call (refer to CM for follow-up) (confirmed patient has phone number-- states she is able to call without assistance- she is a medical claims staff member  with [employer]; provided my direct phone number should she require assistance after her own efforts to confirm services) Any new equipment or medical supplies ordered?: No  Functional Questionnaire: Do you need assistance with bathing/showering or dressing?: Yes (daughter supervises as indicated/ needed) Do you need assistance with meal preparation?: No Do you need assistance with eating?: No Do you have difficulty maintaining continence: No Do you need assistance with getting out of bed/getting out of a chair/moving?: No Do you have difficulty managing or taking your medications?: No  Follow up appointments reviewed: PCP Follow-up appointment confirmed?: Yes (care coordination outreach in real-time with scheduling care guide to successfully schedule hospital follow up PCP appointment on 10/04/23) Date of PCP follow-up appointment?: 10/04/23 Follow-up Provider: PCP- covering provider Specialist Hospital Follow-up appointment confirmed?: No Reason Specialist Follow-Up Not Confirmed:  Patient has Specialist Provider Number and will Call for Appointment Do you need transportation to your follow-up appointment?: No Do you understand care options if your condition(s) worsen?: Yes-patient verbalized understanding  SDOH Interventions Today    Flowsheet Row Most Recent Value  SDOH Interventions   Food Insecurity Interventions Intervention Not Indicated  Transportation Interventions Intervention Not Indicated  [daughter provides transportation]      TOC Interventions Today    Flowsheet Row Most Recent Value  TOC Interventions   TOC Interventions Discussed/Reviewed TOC Interventions Discussed, Arranged PCP follow up within 7 days/Care Guide scheduled  [Patient declines need for ongoing/ further care management outreach,  no needs identified at time of TOC call today- declines enrollment in 30-day TOC program,  provided my direct contact information should questions/ concerns/ needs arise post-TOC  call]      Interventions Today    Flowsheet Row Most Recent Value  Chronic Disease   Chronic disease during today's visit Other  [sepsis secondary to gastroenteritis]  General Interventions   General Interventions Discussed/Reviewed General Interventions Discussed, Doctor Visits, Durable Medical Equipment (DME)  Doctor Visits Discussed/Reviewed Doctor Visits Discussed, PCP, Specialist  Durable Medical Equipment (DME) Val Riles uses assistive devices on regular basis, at baseline -- walker: "mainly when I go out"]  PCP/Specialist Visits Compliance with follow-up visit  Exercise Interventions   Exercise Discussed/Reviewed Exercise Discussed  [Home Health PT services]  Education Interventions   Education Provided Provided Education  [reinforced role of home health services- patient is medical claims employee and verbalizes good understanding of same]  Nutrition Interventions   Nutrition Discussed/Reviewed Nutrition Discussed  Pharmacy Interventions   Pharmacy Dicussed/Reviewed Pharmacy Topics Discussed  [Full medication review with updating medication list in EHR per patient report]  Safety Interventions   Safety Discussed/Reviewed Safety Discussed, Fall Risk      Caryl Pina, RN, BSN, Media planner  Transitions of Care  [facility]  [place] [phone]: direct office

## 2023-10-02 ENCOUNTER — Other Ambulatory Visit: Payer: Self-pay | Admitting: Nurse Practitioner

## 2023-10-02 NOTE — Telephone Encounter (Signed)
Please advise KH 

## 2023-10-03 ENCOUNTER — Other Ambulatory Visit: Payer: Self-pay

## 2023-10-03 NOTE — Telephone Encounter (Signed)
Please advise KH 

## 2023-10-04 ENCOUNTER — Inpatient Hospital Stay: Payer: Self-pay | Admitting: Nurse Practitioner

## 2023-10-04 MED ORDER — BUSPIRONE HCL 10 MG PO TABS: 10.0000 mg | ORAL_TABLET | Freq: Two times a day (BID) | ORAL | 0 refills | Status: DC

## 2023-10-11 NOTE — Plan of Care (Signed)
 [facility] Tonsillectomy/Adenoidectomy, Postoperative PEDS care plan entered in error.

## 2023-10-17 ENCOUNTER — Telehealth: Payer: Self-pay

## 2023-10-17 NOTE — Telephone Encounter (Signed)
Frances Furbish is reporting pt is not taking fiber due to it not being covered. Please advise [facility]

## 2023-12-10 ENCOUNTER — Other Ambulatory Visit: Payer: Self-pay | Admitting: Nurse Practitioner

## 2023-12-24 ENCOUNTER — Other Ambulatory Visit: Payer: Self-pay | Admitting: Nurse Practitioner

## 2024-01-12 ENCOUNTER — Other Ambulatory Visit: Payer: Self-pay | Admitting: Nurse Practitioner

## 2024-01-29 ENCOUNTER — Ambulatory Visit: Payer: Self-pay | Admitting: Nurse Practitioner

## 2024-01-29 ENCOUNTER — Telehealth: Payer: BC Managed Care – PPO | Admitting: Physician Assistant

## 2024-01-29 DIAGNOSIS — R0989 Other specified symptoms and signs involving the circulatory and respiratory systems: Secondary | ICD-10-CM | POA: Diagnosis not present

## 2024-01-29 MED ORDER — OSELTAMIVIR PHOSPHATE 75 MG PO CAPS: 75.0000 mg | ORAL_CAPSULE | Freq: Two times a day (BID) | ORAL | 0 refills | Status: AC

## 2024-01-29 NOTE — Progress Notes (Signed)
Virtual Visit via Video Note  I connected with Holly Roberts on 01/29/24 at 11:20 AM EST by a video enabled telemedicine application and verified that I am speaking with the correct person using two identifiers.  Patient Location: Home Provider Location: Office/Clinic  I discussed the limitations, risks, security, and privacy concerns of performing an evaluation and management service by video and the availability of in person appointments. I also discussed with the patient that there may be a patient responsible charge related to this service. The patient expressed understanding and agreed to proceed.  Subjective: PCP: Ivonne Andrew, NP  No chief complaint on file.  Patient presents today with complaints of fatigue, body aches, sore throat, diarrhea, fever, cough, and congestion. She reports symptoms began last night. She lives with her daughter who recently tested positive for the flu. Patient denies shortness of breath, chest pain, difficulty breathing or swallowing. She has been taking ibuprofen and tylenol as needed for fever and pain. She is requesting Tamiflu today.    ROS: Per HPI  Current Outpatient Medications:    oseltamivir (TAMIFLU) 75 MG capsule, Take 1 capsule (75 mg total) by mouth 2 (two) times daily for 5 days., Disp: 10 capsule, Rfl: 0   amLODipine (NORVASC) 10 MG tablet, TAKE 1 TABLET BY MOUTH EVERY DAY, Disp: 90 tablet, Rfl: 3   Armodafinil 250 MG tablet, Take 250 mg by mouth every morning., Disp: , Rfl:    busPIRone (BUSPAR) 10 MG tablet, TAKE 1 TABLET(10 MG) BY MOUTH TWICE DAILY, Disp: 60 tablet, Rfl: 0   clobetasol cream (TEMOVATE) 0.05 %, Apply 1 Application topically 2 (two) times daily., Disp: 30 g, Rfl: 0   doxepin (SINEQUAN) 100 MG capsule, Take 100 mg by mouth at bedtime., Disp: , Rfl:    fiber supplement, BANATROL TF, liquid, Take 60 mLs by mouth 2 (two) times daily. (Patient not taking: Reported on 10/01/2023), Disp: , Rfl:    folic acid (FOLVITE) 1 MG  tablet, TAKE 1 TABLET BY MOUTH EVERY DAY, Disp: 90 tablet, Rfl: 3   HORIZANT 600 MG TBCR, Take 600-1,200 mg by mouth in the morning and at bedtime. Take 1 tablet (600 mg) in the morning and Take 2 tablets (1200 mg) at bedtime, Disp: , Rfl:    hydrOXYzine (ATARAX) 10 MG tablet, TAKE 1 TABLET BY MOUTH THREE TIMES A DAY AS NEEDED, Disp: 30 tablet, Rfl: 3   metoprolol tartrate (LOPRESSOR) 50 MG tablet, TAKE 1 TABLET BY MOUTH TWICE DAILY, Disp: 180 tablet, Rfl: 3   Multiple Vitamin (MULTIVITAMIN WITH MINERALS) TABS tablet, Take 1 tablet by mouth daily. (Patient taking differently: Take 1 tablet by mouth daily. Centrum), Disp: , Rfl:    naloxone (NARCAN) nasal spray 4 mg/0.1 mL, Place 1 spray into the nose once., Disp: , Rfl:    NUCYNTA 100 MG TABS, Take 2 tablets (200 mg total) by mouth daily in the afternoon. (Patient taking differently: Take 100 mg by mouth daily in the afternoon. Afternoon), Disp: 60 tablet, Rfl: 0   NUCYNTA ER 250 MG TB12, Take 250 mg by mouth 2 (two) times daily., Disp: , Rfl:    NYSTATIN powder, APPLY TOPICALLY TO AFFECTED AREA(S) THREE TIMES DAILY, Disp: 15 g, Rfl: 0   pantoprazole (PROTONIX) 40 MG tablet, TAKE 1 TABLET BY MOUTH EVERY DAY, Disp: 90 tablet, Rfl: 3   TRINTELLIX 20 MG TABS tablet, Take 20 mg by mouth daily., Disp: , Rfl:    VRAYLAR 6 MG CAPS, Take 6 mg by  mouth daily., Disp: , Rfl:   Observations/Objective: There were no vitals filed for this visit. Physical Exam Constitutional:      Appearance: She is ill-appearing.  HENT:     Nose: Congestion present.     Mouth/Throat:     Mouth: Mucous membranes are moist.     Pharynx: Oropharynx is clear.  Pulmonary:     Effort: Pulmonary effort is normal.  Musculoskeletal:     Cervical back: Normal range of motion.  Neurological:     Mental Status: She is alert and oriented to person, place, and time.  Psychiatric:        Mood and Affect: Mood normal.     Assessment and Plan: Suspected novel influenza A virus  infection -     Oseltamivir Phosphate; Take 1 capsule (75 mg total) by mouth 2 (two) times daily for 5 days.  Dispense: 10 capsule; Refill: 0   Patient appears stable today. Supportive care reviewed with patient. Discussed with patient that there are no indications for antibiotics at this time, and viral respiratory illness can be persistent in duration.Tylenol or ibuprofen for pain or fever as needed. May continue with OTC cold medications. Patient instructed to return to clinic if worsening shortness of breath, chest pain, hypoxia, or other concerns. Patient agreeable to plan.    Follow Up Instructions: Return if symptoms worsen or fail to improve.   I discussed the assessment and treatment plan with the patient. The patient was provided an opportunity to ask questions, and all were answered. The patient agreed with the plan and demonstrated an understanding of the instructions.   The patient was advised to call back or seek an in-person evaluation if the symptoms worsen or if the condition fails to improve as anticipated.  The above assessment and management plan was discussed with the patient. The patient verbalized understanding of and has agreed to the management plan.   Toni Amend Kinta Martis, PA-C

## 2024-01-29 NOTE — Telephone Encounter (Signed)
Copied from CRM [id number]. Topic: Clinical - Red Word Triage >> Jan 29, 2024 10:42 AM Holly Roberts wrote: [facility] that prompted transfer to Nurse Triage: Patient has been vomiting and diarrhea since yesterday//Patient also as congestion and cough//Patients daughter was diagnosed with the flu//  Chief Complaint: Flu exposure/symptoms Symptoms: Vomiting, diarrhea, cough, fever Frequency: 1-2 days Pertinent Negatives: Patient denies relief Disposition: [] ED /[] Urgent Care (no appt availability in office) / [x] Appointment(In office/virtual)/ []  [facility]/ [] Home Care/ [] Refused Recommended Disposition /[] [facility]/ []  Follow-up with PCP Additional Notes: Spoke to patient's daughter, Holly Roberts, on behalf of her mother who is experiencing flu symptoms. Patient's daughter tested positive for flu A recently. Patient's symptoms started 1-2 days ago. Patient is experiencing vomiting, diarrhea, cough and fever. Patient is specifically requesting Tamiflu. Patient requested a virtual appointment. No availability with PCP office. This RN scheduled a virtual appointment with a provider at a different office. This RN advised patient/daughter to call back if symptoms worsen. Patient/daughter complied.  Reason for Disposition  [1] Influenza EXPOSURE within last 48 hours (2 days) AND [2] NOT HIGH RISK AND [3] strongly requests antiviral medication  Answer Assessment - Initial Assessment Questions 1. TYPE of EXPOSURE: "How were you exposed?" (e.g., close contact, not a close contact)     Daughter has flu A 2. DATE of EXPOSURE: "When did the exposure occur?" (e.g., hour, days, weeks)     Symptoms started 1-2 days ago 4. HIGH RISK for COMPLICATIONS: "Do you have any heart or lung problems?" "Do you have a weakened immune system?" (e.g., CHF, COPD, asthma, HIV positive, chemotherapy, renal failure, diabetes mellitus, sickle cell anemia)     Denies 5. SYMPTOMS: "Do you have any symptoms?"  (e.g., cough, fever, sore throat, difficulty breathing).     Vomiting, diarrhea, coughing, fever of 100.0 last night- Tylenol brought fever down to 99  Protocols used: Influenza (Flu) Exposure-A-AH

## 2024-02-03 ENCOUNTER — Encounter: Payer: Self-pay | Admitting: Physician Assistant

## 2024-02-03 NOTE — Telephone Encounter (Signed)
 Grooms, Rulon Eisenmenger     02/03/24  4:42 PM Work note added to her visit.

## 2024-02-05 ENCOUNTER — Telehealth: Payer: Self-pay

## 2024-02-05 NOTE — Telephone Encounter (Signed)
 Copied from CRM [id number]. Topic: General - Other >> Feb 03, 2024  2:35 PM Clayton Bibles wrote: Reason for CRM: Holly Roberts is calling back to get a note from [place] NP for her work. She needs to know when she return and her work has to have a note before she can return.  Please send questions or note to MyChart or call

## 2024-02-15 ENCOUNTER — Other Ambulatory Visit: Payer: Self-pay | Admitting: Nurse Practitioner

## 2024-02-17 NOTE — Telephone Encounter (Signed)
 Please advise [facility]

## 2024-02-18 ENCOUNTER — Other Ambulatory Visit: Payer: Self-pay | Admitting: Nurse Practitioner

## 2024-02-19 NOTE — Telephone Encounter (Signed)
 Please advise [facility]

## 2024-02-21 ENCOUNTER — Other Ambulatory Visit: Payer: Self-pay | Admitting: Nurse Practitioner

## 2024-02-21 DIAGNOSIS — K219 Gastro-esophageal reflux disease without esophagitis: Secondary | ICD-10-CM

## 2024-04-06 ENCOUNTER — Other Ambulatory Visit: Payer: Self-pay | Admitting: Nurse Practitioner

## 2024-06-18 ENCOUNTER — Other Ambulatory Visit: Payer: Self-pay

## 2024-06-18 NOTE — Telephone Encounter (Signed)
 Please advise [facility]

## 2024-06-21 MED ORDER — NYSTATIN 100000 UNIT/GM EX POWD: Freq: Two times a day (BID) | CUTANEOUS | 0 refills | Status: AC

## 2024-06-22 ENCOUNTER — Other Ambulatory Visit: Payer: Self-pay | Admitting: Nurse Practitioner

## 2024-06-22 DIAGNOSIS — K219 Gastro-esophageal reflux disease without esophagitis: Secondary | ICD-10-CM

## 2024-08-05 ENCOUNTER — Other Ambulatory Visit: Payer: Self-pay | Admitting: Nurse Practitioner

## 2024-08-05 DIAGNOSIS — I10 Essential (primary) hypertension: Secondary | ICD-10-CM

## 2024-08-23 ENCOUNTER — Other Ambulatory Visit: Payer: Self-pay | Admitting: Nurse Practitioner

## 2024-08-23 DIAGNOSIS — I10 Essential (primary) hypertension: Secondary | ICD-10-CM

## 2024-09-16 ENCOUNTER — Other Ambulatory Visit: Payer: Self-pay | Admitting: Nurse Practitioner
